# Patient Record
Sex: Male | Born: 1955
Health system: Southern US, Community
[De-identification: ages and names within clinical notes are randomized; demographics above are authoritative.]

## PROBLEM LIST (undated history)

## (undated) DIAGNOSIS — J449 Chronic obstructive pulmonary disease, unspecified: Secondary | ICD-10-CM

## (undated) DIAGNOSIS — M1990 Unspecified osteoarthritis, unspecified site: Secondary | ICD-10-CM

## (undated) DIAGNOSIS — IMO0002 Reserved for concepts with insufficient information to code with codable children: Secondary | ICD-10-CM

## (undated) DIAGNOSIS — E039 Hypothyroidism, unspecified: Secondary | ICD-10-CM

## (undated) DIAGNOSIS — Z950 Presence of cardiac pacemaker: Secondary | ICD-10-CM

## (undated) DIAGNOSIS — J189 Pneumonia, unspecified organism: Secondary | ICD-10-CM

## (undated) DIAGNOSIS — R0600 Dyspnea, unspecified: Secondary | ICD-10-CM

## (undated) DIAGNOSIS — J439 Emphysema, unspecified: Secondary | ICD-10-CM

## (undated) DIAGNOSIS — T7840XA Allergy, unspecified, initial encounter: Secondary | ICD-10-CM

## (undated) DIAGNOSIS — F419 Anxiety disorder, unspecified: Secondary | ICD-10-CM

## (undated) DIAGNOSIS — D649 Anemia, unspecified: Secondary | ICD-10-CM

## (undated) DIAGNOSIS — K219 Gastro-esophageal reflux disease without esophagitis: Secondary | ICD-10-CM

## (undated) DIAGNOSIS — E079 Disorder of thyroid, unspecified: Secondary | ICD-10-CM

## (undated) DIAGNOSIS — C801 Malignant (primary) neoplasm, unspecified: Secondary | ICD-10-CM

## (undated) DIAGNOSIS — B1920 Unspecified viral hepatitis C without hepatic coma: Secondary | ICD-10-CM

## (undated) DIAGNOSIS — Z923 Personal history of irradiation: Secondary | ICD-10-CM

## (undated) DIAGNOSIS — G4730 Sleep apnea, unspecified: Secondary | ICD-10-CM

## (undated) DIAGNOSIS — I10 Essential (primary) hypertension: Secondary | ICD-10-CM

## (undated) HISTORY — DX: Emphysema, unspecified: J43.9

## (undated) HISTORY — PX: ELBOW SURGERY: SHX618

## (undated) HISTORY — PX: BACK SURGERY: SHX140

## (undated) HISTORY — DX: Malignant (primary) neoplasm, unspecified: C80.1

## (undated) HISTORY — DX: Disorder of thyroid, unspecified: E07.9

## (undated) HISTORY — PX: OTHER SURGICAL HISTORY: SHX169

## (undated) HISTORY — DX: Unspecified osteoarthritis, unspecified site: M19.90

## (undated) HISTORY — PX: TONSILLECTOMY: SUR1361

## (undated) HISTORY — PX: SHOULDER SURGERY: SHX246

## (undated) HISTORY — DX: Anemia, unspecified: D64.9

## (undated) HISTORY — DX: Sleep apnea, unspecified: G47.30

## (undated) HISTORY — PX: WRIST SURGERY: SHX841

## (undated) HISTORY — DX: Gastro-esophageal reflux disease without esophagitis: K21.9

## (undated) HISTORY — DX: Allergy, unspecified, initial encounter: T78.40XA

## (undated) HISTORY — DX: Anxiety disorder, unspecified: F41.9

## (undated) HISTORY — DX: Reserved for concepts with insufficient information to code with codable children: IMO0002

## (undated) HISTORY — DX: Essential (primary) hypertension: I10

---

## 1999-10-02 ENCOUNTER — Ambulatory Visit (HOSPITAL_COMMUNITY): Admission: RE | Admit: 1999-10-02 | Discharge: 1999-10-02 | Payer: Self-pay | Admitting: Gastroenterology

## 1999-10-02 ENCOUNTER — Encounter: Payer: Self-pay | Admitting: Gastroenterology

## 1999-10-02 ENCOUNTER — Encounter (INDEPENDENT_AMBULATORY_CARE_PROVIDER_SITE_OTHER): Payer: Self-pay | Admitting: Specialist

## 2002-06-09 ENCOUNTER — Encounter: Payer: Self-pay | Admitting: Specialist

## 2002-06-09 ENCOUNTER — Ambulatory Visit (HOSPITAL_COMMUNITY): Admission: RE | Admit: 2002-06-09 | Discharge: 2002-06-09 | Payer: Self-pay | Admitting: Specialist

## 2002-08-11 ENCOUNTER — Encounter: Payer: Self-pay | Admitting: Orthopaedic Surgery

## 2002-08-13 ENCOUNTER — Inpatient Hospital Stay (HOSPITAL_COMMUNITY): Admission: RE | Admit: 2002-08-13 | Discharge: 2002-08-15 | Payer: Self-pay | Admitting: Orthopaedic Surgery

## 2002-08-13 ENCOUNTER — Encounter: Payer: Self-pay | Admitting: Orthopaedic Surgery

## 2002-08-15 ENCOUNTER — Encounter: Payer: Self-pay | Admitting: Orthopaedic Surgery

## 2005-05-01 ENCOUNTER — Emergency Department (HOSPITAL_COMMUNITY): Admission: EM | Admit: 2005-05-01 | Discharge: 2005-05-01 | Payer: Self-pay | Admitting: Emergency Medicine

## 2010-02-07 ENCOUNTER — Encounter: Payer: Self-pay | Admitting: Family Medicine

## 2010-02-07 ENCOUNTER — Ambulatory Visit: Payer: Self-pay | Admitting: Family Medicine

## 2010-02-07 DIAGNOSIS — I10 Essential (primary) hypertension: Secondary | ICD-10-CM | POA: Insufficient documentation

## 2010-02-07 DIAGNOSIS — J449 Chronic obstructive pulmonary disease, unspecified: Secondary | ICD-10-CM | POA: Insufficient documentation

## 2010-02-07 DIAGNOSIS — J4489 Other specified chronic obstructive pulmonary disease: Secondary | ICD-10-CM | POA: Insufficient documentation

## 2010-02-07 DIAGNOSIS — K219 Gastro-esophageal reflux disease without esophagitis: Secondary | ICD-10-CM

## 2010-02-07 DIAGNOSIS — J309 Allergic rhinitis, unspecified: Secondary | ICD-10-CM | POA: Insufficient documentation

## 2010-02-07 DIAGNOSIS — F17200 Nicotine dependence, unspecified, uncomplicated: Secondary | ICD-10-CM | POA: Insufficient documentation

## 2010-02-07 LAB — CONVERTED CEMR LAB
ALT: 56 units/L — ABNORMAL HIGH (ref 0–53)
AST: 51 units/L — ABNORMAL HIGH (ref 0–37)
Albumin: 4.1 g/dL (ref 3.5–5.2)
Alkaline Phosphatase: 58 units/L (ref 39–117)
BUN: 11 mg/dL (ref 6–23)
Basophils Absolute: 0.1 10*3/uL (ref 0.0–0.1)
Basophils Relative: 0.8 % (ref 0.0–3.0)
Bilirubin Urine: NEGATIVE
Bilirubin, Direct: 0.1 mg/dL (ref 0.0–0.3)
Blood in Urine, dipstick: NEGATIVE
CO2: 27 meq/L (ref 19–32)
Calcium: 9 mg/dL (ref 8.4–10.5)
Chloride: 102 meq/L (ref 96–112)
Cholesterol: 151 mg/dL (ref 0–200)
Creatinine, Ser: 0.9 mg/dL (ref 0.4–1.5)
Eosinophils Absolute: 0.2 10*3/uL (ref 0.0–0.7)
Eosinophils Relative: 2.1 % (ref 0.0–5.0)
GFR calc non Af Amer: 90.26 mL/min (ref >60)
Glucose, Bld: 84 mg/dL (ref 70–99)
Glucose, Urine, Semiquant: NEGATIVE
HCT: 45.5 % (ref 39.0–52.0)
HDL: 38.8 mg/dL — ABNORMAL LOW (ref >39.00)
Hemoglobin: 15.6 g/dL (ref 13.0–17.0)
Ketones, urine, test strip: NEGATIVE
LDL Cholesterol: 93 mg/dL (ref 0–99)
Lymphocytes Relative: 30.1 % (ref 12.0–46.0)
Lymphs Abs: 2.3 10*3/uL (ref 0.7–4.0)
MCHC: 34.3 g/dL (ref 30.0–36.0)
MCV: 96.9 fL (ref 78.0–100.0)
Monocytes Absolute: 0.7 10*3/uL (ref 0.1–1.0)
Monocytes Relative: 8.7 % (ref 3.0–12.0)
Neutro Abs: 4.5 10*3/uL (ref 1.4–7.7)
Neutrophils Relative %: 58.3 % (ref 43.0–77.0)
Nitrite: NEGATIVE
PSA: 0.69 ng/mL (ref 0.10–4.00)
Platelets: 231 10*3/uL (ref 150.0–400.0)
Potassium: 4 meq/L (ref 3.5–5.1)
RBC: 4.7 M/uL (ref 4.22–5.81)
RDW: 12.7 % (ref 11.5–14.6)
Sodium: 139 meq/L (ref 135–145)
Specific Gravity, Urine: 1.015
TSH: 1.23 microintl units/mL (ref 0.35–5.50)
Total Bilirubin: 0.8 mg/dL (ref 0.3–1.2)
Total CHOL/HDL Ratio: 4
Total Protein: 6.8 g/dL (ref 6.0–8.3)
Triglycerides: 97 mg/dL (ref 0.0–149.0)
Urobilinogen, UA: 0.2
VLDL: 19.4 mg/dL (ref 0.0–40.0)
WBC Urine, dipstick: NEGATIVE
WBC: 7.7 10*3/uL (ref 4.5–10.5)
pH: 6

## 2010-02-08 ENCOUNTER — Ambulatory Visit: Payer: Self-pay | Admitting: Family Medicine

## 2010-02-20 ENCOUNTER — Ambulatory Visit: Payer: Self-pay | Admitting: Family Medicine

## 2010-03-09 ENCOUNTER — Ambulatory Visit: Payer: Self-pay | Admitting: Family Medicine

## 2010-03-28 ENCOUNTER — Telehealth: Payer: Self-pay | Admitting: Family Medicine

## 2010-04-06 ENCOUNTER — Ambulatory Visit
Admission: RE | Admit: 2010-04-06 | Discharge: 2010-04-06 | Payer: Self-pay | Source: Home / Self Care | Attending: Family Medicine | Admitting: Family Medicine

## 2010-04-06 ENCOUNTER — Other Ambulatory Visit: Payer: Self-pay | Admitting: Family Medicine

## 2010-04-07 LAB — BASIC METABOLIC PANEL
BUN: 15 mg/dL (ref 6–23)
CO2: 29 mEq/L (ref 19–32)
Calcium: 9.2 mg/dL (ref 8.4–10.5)
Chloride: 102 mEq/L (ref 96–112)
Creatinine, Ser: 0.8 mg/dL (ref 0.4–1.5)
GFR: 100.19 mL/min (ref >60.00)
Glucose, Bld: 79 mg/dL (ref 70–99)
Potassium: 4.4 mEq/L (ref 3.5–5.1)
Sodium: 140 mEq/L (ref 135–145)

## 2010-04-25 NOTE — Assessment & Plan Note (Signed)
Summary: 1 WEEK FUP//CCM   Vital Signs:  Patient profile:   55 year old male Weight:      198 pounds Temp:     98.3 degrees F oral BP sitting:   150 / 98  (left arm) Cuff size:   regular  Vitals Entered By: Kern Reap CMA Duncan Dull) (February 20, 2010 4:55 PM) CC: follow-up visit   CC:  follow-up visit.  History of Present Illness: George Nielsen is a 55 year old male, smoker, who comes in today for reevaluation of asthma and tobacco abuse.  On November the 15th we saw him and start him on prednisone 40 mg x 3 days with the taper.  He checked her for 9 days and stopped.  He is also started the chantix and is taken one tablet daily, however, he still continues to smoke 10 cigarettes a day.  He's taken hydrochlorothiazide, and Norvasc for his hypertension.  BP 150/90.  He insisted and a shot of antibiotics and a shot of steroids or what he needs it.  I explained to him.  He is already on steroids.  He does not need a shot and if he would like an antibiotic would be happy to write him some although the major issue is............. bite the bullet and stop smoking  Allergies: 1)  ! * Bee Stings 2)  ! * Poison Ivy  Past History:  Past medical, surgical, family and social histories (including risk factors) reviewed for relevance to current acute and chronic problems.  Past Medical History: Reviewed history from 02/07/2010 and no changes required. Allergic rhinitis GERD Hypertension hx of ulcers  Past Surgical History: Reviewed history from 02/07/2010 and no changes required. Tonsillectomy back surgery right arm surgery  Family History: Reviewed history from 02/07/2010 and no changes required. Father: deceased - heart diseased Mother: deceased - breast cancer Siblings:   Social History: Reviewed history from 02/07/2010 and no changes required. Occupation: Divorced Current Smoker Alcohol use-yes Drug use-no Regular exercise-no  Review of Systems      See HPI  Physical  Exam  General:  Well-developed,well-nourished,in no acute distress; alert,appropriate and cooperative throughout examination Head:  Normocephalic and atraumatic without obvious abnormalities. No apparent alopecia or balding. Eyes:  No corneal or conjunctival inflammation noted. EOMI. Perrla. Funduscopic exam benign, without hemorrhages, exudates or papilledema. Vision grossly normal. Ears:  External ear exam shows no significant lesions or deformities.  Otoscopic examination reveals clear canals, tympanic membranes are intact bilaterally without bulging, retraction, inflammation or discharge. Hearing is grossly normal bilaterally. Nose:  External nasal examination shows no deformity or inflammation. Nasal mucosa are pink and moist without lesions or exudates. Mouth:  Oral mucosa and oropharynx without lesions or exudates.  Teeth in good repair. Neck:  No deformities, masses, or tenderness noted. Lungs:  marked decrease in breath sounds inspiratory and expiratory wheezing   Impression & Recommendations:  Problem # 1:  TOBACCO USE (ICD-305.1) Assessment Improved  His updated medication list for this problem includes:    Chantix Continuing Month Pak 1 Mg Tabs (Varenicline tartrate) .Marland Kitchen... 1/2 tab qam  Orders: Tobacco use cessation intermediate 3-10 minutes (24401)  Problem # 2:  ASTHMA WITH COPD (ICD-493.20) Assessment: Improved  His updated medication list for this problem includes:    Prednisone 20 Mg Tabs (Prednisone) ..... Uad  Orders: Tobacco use cessation intermediate 3-10 minutes (99406)  Complete Medication List: 1)  Norvasc 5 Mg Tabs (Amlodipine besylate) .... Take 1 tablet by mouth every morning 2)  Hydrochlorothiazide 25 Mg Tabs (Hydrochlorothiazide) .Marland KitchenMarland KitchenMarland Kitchen  Take 1 tablet by mouth every morning 3)  Prednisone 20 Mg Tabs (Prednisone) .... Uad 4)  Chantix Continuing Month Pak 1 Mg Tabs (Varenicline tartrate) .... 1/2 tab qam 5)  Doxycycline Hyclate 100 Mg Caps (Doxycycline  hyclate) .... Take 1 tablet by mouth two times a day  Patient Instructions: 1)  restart the prednisone by taking one tablet daily for 5 days, a half a tablet for 5 days, then half a tablet every other day for a 3-week taper. 2)   30 ounces of water a day. 3)  Stop smoking completely. 4)  Doxycycline.......... is an antibiotic............ one tablet twice daily for two weeks. 5)  Check a blood pressure daily in the morning and continue your blood pressure medication. 6)  Return in two weeks for follow-up Prescriptions: PREDNISONE 20 MG TABS (PREDNISONE) UAD  #30 x 1   Entered and Authorized by:   Roderick Pee MD   Signed by:   Roderick Pee MD on 02/20/2010   Method used:   Print then Give to Patient   RxID:   [id number] DOXYCYCLINE HYCLATE 100 MG CAPS (DOXYCYCLINE HYCLATE) Take 1 tablet by mouth two times a day  #30 x 0   Entered and Authorized by:   Roderick Pee MD   Signed by:   Roderick Pee MD on 02/20/2010   Method used:   Print then Give to Patient   RxID:   [id number]    Orders Added: 1)  Est. Patient Level IV [84696] 2)  Tobacco use cessation intermediate 3-10 minutes [99406]

## 2010-04-25 NOTE — Assessment & Plan Note (Signed)
Summary: NEW PT EST // RS PT [facility]/NJR   Vital Signs:  Patient profile:   55 year old male Height:      72 inches Weight:      195 pounds BMI:     26.54 Temp:     97.6 degrees F oral BP sitting:   170 / 98  (left arm) Cuff size:   regular  Vitals Entered By: Kern Reap CMA Duncan Dull) (February 07, 2010 3:44 PM) CC: new to establish  Is Patient Diabetic? No Pain Assessment Patient in pain? no        CC:  new to establish .  History of Present Illness: George Nielsen is a 55 year old, divorced male, smoker, one pack a day for 30+ years, who comes in today as a new patient for evaluation of tobacco abuse, shortness of breath, and hypertension.  He's been previously been going to the [place].  He was in the [employer] for two years.  He went to the [place] because he lost his health in.  Tetanus was two 2009 seasonal flu shot 2011  He has a long-standing history of tobacco abuse as noted above.  He quit for a year with the program called chantix last chest x-ray unknown.  He's had a history of hypertension.  He was on lisinopril 20 -- 12.5, but he says it causing joint pain, so he stopped it.  BP today 170 over hundred,  He's also complaining of wheezing for the last two months.  Last physical exam for 5 years ago  Preventive Screening-Counseling & Management  Alcohol-Tobacco     Smoking Status: current     Packs/Day: 1.0  Caffeine-Diet-Exercise     Does Patient Exercise: no      Drug Use:  no.    Allergies (verified): 1)  ! * Bee Stings 2)  ! * Poison Ivy  Past History:  Past Medical History: Allergic rhinitis GERD Hypertension hx of ulcers  Past Surgical History: Tonsillectomy back surgery right arm surgery  Family History: Father: deceased - heart diseased Mother: deceased - breast cancer Siblings:   Social History: Reviewed history and no changes required. Occupation: Divorced Current Smoker Alcohol use-yes Drug use-no Regular exercise-no Smoking Status:   current Packs/Day:  1.0 Drug Use:  no Does Patient Exercise:  no  Physical Exam  General:  Well-developed,well-nourished,in no acute distress; alert,appropriate and cooperative throughout examination Neck:  No deformities, masses, or tenderness noted. Chest Wall:  No deformities, masses, tenderness or gynecomastia noted. Lungs:  barely audible breath sounds bilateral wheezing Heart:  the PMI is nonpalpable.  Heart sounds are distant because of underlying COPD.  I can appreciate no murmurs   Problems:  Medical Problems Added: 1)  Dx of Tobacco Use  (ICD-305.1) 2)  Dx of Asthma With COPD  (ICD-493.20) 3)  Dx of Hypertension, Malignant Essential  (ICD-401.0) 4)  Dx of Hypertension  (ICD-401.9) 5)  Dx of Gerd  (ICD-530.81) 6)  Dx of Allergic Rhinitis  (ICD-477.9)  Impression & Recommendations:  Problem # 1:  TOBACCO USE (ICD-305.1) Assessment New  Orders: T-2 View CXR (71020TC) Venipuncture (81191) TLB-Lipid Panel (80061-LIPID) TLB-BMP (Basic Metabolic Panel-BMET) (80048-METABOL) TLB-CBC Platelet - w/Differential (85025-CBCD) TLB-Hepatic/Liver Function Pnl (80076-HEPATIC) TLB-TSH (Thyroid Stimulating Hormone) (84443-TSH) TLB-PSA (Prostate Specific Antigen) (84153-PSA) UA Dipstick w/o Micro (automated)  (81003) Tobacco use cessation intensive >10 minutes (47829) Specimen Handling (56213) EKG w/ Interpretation (93000) Spirometry w/Graph (94010)  His updated medication list for this problem includes:    Chantix Continuing Month Pak 1  Mg Tabs (Varenicline tartrate) .Marland Kitchen... 1/2 tab qam  Problem # 2:  ASTHMA WITH COPD (ICD-493.20) Assessment: New  Orders: T-2 View CXR (71020TC) Venipuncture (72536) TLB-Lipid Panel (80061-LIPID) TLB-BMP (Basic Metabolic Panel-BMET) (80048-METABOL) TLB-CBC Platelet - w/Differential (85025-CBCD) TLB-Hepatic/Liver Function Pnl (80076-HEPATIC) TLB-TSH (Thyroid Stimulating Hormone) (84443-TSH) TLB-PSA (Prostate Specific Antigen)  (84153-PSA) UA Dipstick w/o Micro (automated)  (81003) Specimen Handling (64403) Spirometry w/Graph (94010)  His updated medication list for this problem includes:    Prednisone 20 Mg Tabs (Prednisone) ..... Uad  Problem # 3:  HYPERTENSION (ICD-401.9) Assessment: New  The following medications were removed from the medication list:    Lisinopril-hydrochlorothiazide 20-12.5 Mg Tabs (Lisinopril-hydrochlorothiazide) .Marland Kitchen... Take one tab by mouth once daily His updated medication list for this problem includes:    Norvasc 5 Mg Tabs (Amlodipine besylate) .Marland Kitchen... Take 1 tablet by mouth every morning    Hydrochlorothiazide 25 Mg Tabs (Hydrochlorothiazide) .Marland Kitchen... Take 1 tablet by mouth every morning  Complete Medication List: 1)  Norvasc 5 Mg Tabs (Amlodipine besylate) .... Take 1 tablet by mouth every morning 2)  Hydrochlorothiazide 25 Mg Tabs (Hydrochlorothiazide) .... Take 1 tablet by mouth every morning 3)  Prednisone 20 Mg Tabs (Prednisone) .... Uad 4)  Chantix Continuing Month Pak 1 Mg Tabs (Varenicline tartrate) .... 1/2 tab qam  Patient Instructions: 1)  u  must stop smoking completely now!!!!!!!!!!!!!! 2)  Begin the chantix one half tab q.a.m. 3)  Take one of the Norvasc and hydrochlorothiazide when you get it filled, now, then, starting tomorrow morning, take one of each every morning to lower your blood pressure goal is 135/85 or less, your current blood pressure is 170 over hundred. 4)  Measure your blood pressure daily in the morning and return in one week with the data and the device. 5)  Go  to the main office tomorrow for a chest x-ray. 6)  I will call you about your laboratory if there is anything unusual.  If not, will go to all your laboratory when you come back in a week  7)  Prednisone two tabs x 3 days, one x 3 days, as x 3 days, then after tablet Monday, Wednesday, Friday, for a two week taper.  This is to stop the wheezing.  He must stay on a salt free  diet Prescriptions: CHANTIX CONTINUING MONTH PAK 1 MG TABS (VARENICLINE TARTRATE) 1/2 tab qam  #1 x 2   Entered and Authorized by:   Roderick Pee MD   Signed by:   Roderick Pee MD on 02/07/2010   Method used:   Print then Give to Patient   RxID:   [id number] PREDNISONE 20 MG TABS (PREDNISONE) UAD  #30 x 0   Entered and Authorized by:   Roderick Pee MD   Signed by:   Roderick Pee MD on 02/07/2010   Method used:   Print then Give to Patient   RxID:   [id number] HYDROCHLOROTHIAZIDE 25 MG TABS (HYDROCHLOROTHIAZIDE) Take 1 tablet by mouth every morning  #30 x 1   Entered and Authorized by:   Roderick Pee MD   Signed by:   Roderick Pee MD on 02/07/2010   Method used:   Print then Give to Patient   RxID:   [id number] NORVASC 5 MG TABS (AMLODIPINE BESYLATE) Take 1 tablet by mouth every morning  #30 x 1   Entered and Authorized by:   Roderick Pee MD   Signed by:   Roderick Pee MD  on 02/07/2010   Method used:   Print then Give to Patient   RxID:   [id number]    Orders Added: 1)  T-2 View CXR [71020TC] 2)  Venipuncture [36415] 3)  TLB-Lipid Panel [80061-LIPID] 4)  TLB-BMP (Basic Metabolic Panel-BMET) [80048-METABOL] 5)  TLB-CBC Platelet - w/Differential [85025-CBCD] 6)  TLB-Hepatic/Liver Function Pnl [80076-HEPATIC] 7)  TLB-TSH (Thyroid Stimulating Hormone) [84443-TSH] 8)  TLB-PSA (Prostate Specific Antigen) [56213-YQM] 9)  New Patient Level IV [99204] 10)  UA Dipstick w/o Micro (automated)  [81003] 11)  Tobacco use cessation intensive >10 minutes [99407] 12)  Specimen Handling [99000] 13)  EKG w/ Interpretation [93000] 14)  Spirometry w/Graph [94010]    Laboratory Results   Urine Tests    Routine Urinalysis   Color: yellow Appearance: Clear Glucose: negative   (Normal Range: Negative) Bilirubin: negative   (Normal Range: Negative) Ketone: negative   (Normal Range: Negative) Spec. Gravity: 1.015   (Normal Range: 1.003-1.035) Blood:  negative   (Normal Range: Negative) pH: 6.0   (Normal Range: 5.0-8.0) Protein: 2+   (Normal Range: Negative) Urobilinogen: 0.2   (Normal Range: 0-1) Nitrite: negative   (Normal Range: Negative) Leukocyte Esterace: negative   (Normal Range: Negative)    Comments: Rita Ohara  February 07, 2010 5:06 PM

## 2010-04-27 NOTE — Progress Notes (Signed)
Summary: Pt req refill Norvasc and HCTZ  Phone Note Refill Request Call back at Home Phone [phone] Message from:  Patient on March 28, 2010 4:50 PM  Refills Requested: Medication #1:  NORVASC 5 MG TABS Take 1 tablet by mouth every morning   Dosage confirmed as above?Dosage Confirmed   Supply Requested: 1 month  Medication #2:  HYDROCHLOROTHIAZIDE 25 MG TABS Take 1 tablet by mouth every morning   Dosage confirmed as above?Dosage Confirmed   Supply Requested: 1 month  Method Requested: Telephone to [facility] Initial call taken by: Lucy Antigua,  March 28, 2010 4:51 PM    Prescriptions: HYDROCHLOROTHIAZIDE 25 MG TABS (HYDROCHLOROTHIAZIDE) Take 1 tablet by mouth every morning  #90 x 3   Entered by:   Kern Reap CMA (AAMA)   Authorized by:   Roderick Pee MD   Signed by:   Kern Reap CMA (AAMA) on 03/28/2010   Method used:   Electronically to        [facility] [phone]* (retail)       [address]       [place], [place]  [address]       Ph: [phone] or [phone]       Fax: [phone]   RxID:   [id number] NORVASC 5 MG TABS (AMLODIPINE BESYLATE) Take 1 tablet by mouth every morning  #90 x 3   Entered by:   Kern Reap CMA (AAMA)   Authorized by:   Roderick Pee MD   Signed by:   Kern Reap CMA (AAMA) on 03/28/2010   Method used:   [facility] to        [facility] [phone][facility] (retail)       [address]       [place], [place]  [address]       Ph: [phone] or [phone]       Fax: [phone]   RxID:   [id number]

## 2010-04-27 NOTE — Assessment & Plan Note (Signed)
Summary: fup on wheezing/cjr   Vital Signs:  Patient profile:   55 year old male Weight:      193 pounds O2 Sat:      96 % on Room air Temp:     98.3 degrees F oral Pulse rate:   96 / minute BP sitting:   150 / 80  (left arm) Cuff size:   regular  Vitals Entered By: Romualdo Bolk, CMA (AAMA) (April 06, 2010 4:14 PM)  O2 Flow:  Room air CC: Follow-up visit on wheezing   CC:  Follow-up visit on wheezing.  History of Present Illness: George Nielsen is a 55 year old male, smoker, who comes back today for reevaluation of smoking cessation, and hypertension.  Four hypertension.  He is taking Norvasc 5 mg daily and hydrochlorothiazide 25 mg daily.  BP 150/80.  He forgot to check his blood pressure at home.  He is taking over-the-counter potassium because of leg cramps.  We will get a be met today.  To determine his potassium level.  We gave him chantix however, when he quit smoking.  He stopped chantix.  Is now back to smoking a pack of cigarettes a day.  I explained that he needs to take the chantix until he quit smoking and then 6 to 8 months after that continue the chantix  Preventive Screening-Counseling & Management  Alcohol-Tobacco     Smoking Status: current     Packs/Day: 1.0  Caffeine-Diet-Exercise     Does Patient Exercise: no  Current Medications (verified): 1)  Norvasc 5 Mg Tabs (Amlodipine Besylate) .... Take 1 Tablet By Mouth Every Morning 2)  Hydrochlorothiazide 25 Mg Tabs (Hydrochlorothiazide) .... Take 1 Tablet By Mouth Every Morning  Allergies (verified): 1)  ! * Bee Stings 2)  ! * Poison Ivy  Past History:  Past medical, surgical, family and social histories (including risk factors) reviewed for relevance to current acute and chronic problems.  Past Medical History: Reviewed history from 02/07/2010 and no changes required. Allergic rhinitis GERD Hypertension hx of ulcers  Past Surgical History: Reviewed history from 02/07/2010 and no changes  required. Tonsillectomy back surgery right arm surgery  Family History: Reviewed history from 02/07/2010 and no changes required. Father: deceased - heart diseased Mother: deceased - breast cancer Siblings:   Social History: Reviewed history from 02/07/2010 and no changes required. Occupation: Divorced Current Smoker Alcohol use-yes Drug use-no Regular exercise-no  Review of Systems      See HPI  Physical Exam  General:  Well-developed,well-nourished,in no acute distress; alert,appropriate and cooperative throughout examination Psych:  Cognition and judgment appear intact. Alert and cooperative with normal attention span and concentration. No apparent delusions, illusions, hallucinations   Impression & Recommendations:  Problem # 1:  TOBACCO USE (ICD-305.1) Assessment Unchanged  The following medications were removed from the medication list:    Chantix Continuing Month Pak 1 Mg Tabs (Varenicline tartrate) .Marland Kitchen... 1/2 tab qam  Orders: Venipuncture (16109) TLB-BMP (Basic Metabolic Panel-BMET) (80048-METABOL) Tobacco use cessation intermediate 3-10 minutes (60454)  Problem # 2:  HYPERTENSION, MALIGNANT ESSENTIAL (ICD-401.0) Assessment: Improved  His updated medication list for this problem includes:    Norvasc 5 Mg Tabs (Amlodipine besylate) .Marland Kitchen... Take 1 tablet by mouth every morning    Hydrochlorothiazide 25 Mg Tabs (Hydrochlorothiazide) .Marland Kitchen... Take 1 tablet by mouth every morning  Orders: Venipuncture (09811) TLB-BMP (Basic Metabolic Panel-BMET) (80048-METABOL) Tobacco use cessation intermediate 3-10 minutes (91478)  Complete Medication List: 1)  Norvasc 5 Mg Tabs (Amlodipine besylate) .... Take 1  tablet by mouth every morning 2)  Hydrochlorothiazide 25 Mg Tabs (Hydrochlorothiazide) .... Take 1 tablet by mouth every morning  Patient Instructions: 1)  I will call you I get the report on your lab work 2)  Restart the chantix by taking half a tablet a day for two  weeks then a half a tablet twice daily.  Also begin to taper the cigarettes by two per week.........Marland Kitchen 10........8........ 6.etc. 3)  Return in two months for follow-up. 4)  Do not stop taking the chantix if prior to returning u  stop smoking   Orders Added: 1)  Venipuncture [36415] 2)  TLB-BMP (Basic Metabolic Panel-BMET) [80048-METABOL] 3)  Est. Patient Level IV [04540] 4)  Tobacco use cessation intermediate 3-10 minutes [99406]

## 2010-05-02 ENCOUNTER — Telehealth: Payer: Self-pay | Admitting: *Deleted

## 2010-05-02 NOTE — Telephone Encounter (Signed)
Spoke with patient.

## 2010-05-02 NOTE — Telephone Encounter (Signed)
Okay to take one potassium tablet if he feels like it might help

## 2010-05-02 NOTE — Telephone Encounter (Signed)
Patient is aware of his lab results.  He states that he has started taking his potassium again because he was cramping a lot.  He would like to know if this is okay or any other suggestions

## 2010-06-07 ENCOUNTER — Encounter: Payer: Self-pay | Admitting: Family Medicine

## 2010-06-08 ENCOUNTER — Ambulatory Visit: Payer: Self-pay | Admitting: Family Medicine

## 2010-07-26 ENCOUNTER — Other Ambulatory Visit: Payer: Self-pay | Admitting: *Deleted

## 2010-07-26 MED ORDER — AMLODIPINE BESYLATE 5 MG PO TABS: 5.0000 mg | ORAL_TABLET | Freq: Every day | ORAL | Status: DC

## 2010-07-26 MED ORDER — HYDROCHLOROTHIAZIDE 25 MG PO TABS: 25.0000 mg | ORAL_TABLET | Freq: Every day | ORAL | Status: DC

## 2010-08-11 NOTE — Op Note (Signed)
NAME:  BRIGIDO, MERA                     ACCOUNT NO.:  [id number]   MEDICAL RECORD NO.:  [id number]                   PATIENT TYPE:  INP   LOCATION:  5016                                 FACILITY:  [facility]   PHYSICIAN:  Sharolyn Douglas, M.D.                     DATE OF BIRTH:  [birth year]   DATE OF PROCEDURE:  08/13/2002  DATE OF DISCHARGE:                                 OPERATIVE REPORT   POSTOPERATIVE DIAGNOSES:  Grade 2-3 isthmic L5-S1 spondylolisthesis with  bilateral lower extremity radiculopathy, right greater than left.   PROCEDURE:  1. L4-5 and L5-S1 laminectomy with facetectomies and wide decompression of     the L5 and S1 nerve roots bilaterally.  2. Posterior spinal arthrodesis L4 to the sacrum with reduction of     deformity.  3. Segmental pedicle screw instrumentation L4 to the sacrum utilizing the     spinal concepts and compass system.  4. Left posterior iliac crest bone graft.  5. Local autogenous bone graft supplemented with platelet gel.  6. Neurologic monitoring utilizing free-running and triggered     electromyograms with testing of six pedicle screws.   SURGEON:  Sharolyn Douglas, M.D.   ASSISTANT:  Verlin Fester, P.A.   ANESTHESIA:  General endotracheal.   ESTIMATED BLOOD LOSS:  450 cc.   COMPLICATIONS:  None.   INDICATIONS:  The patient is a 55 year old male with a long history of back  and bilateral lower extremity pain, right greater than left.  Physical  examination and history is consistent with an L5 and S1 radiculopathy, right  greater than left.  He had several days of relief with bilateral L5 nerve  root injections.  His plane radiographs show a grade 2-3 isthmic  spondylolisthesis at L5-S1.  MRI scan again demonstrates the  spondylolisthesis with degenerative end-plate changes, disk space narrowing,  and severe foraminal narrowing at L5-S1.  Because of his persistent symptoms  refractory to conservative care, he has elected to undergo decompression  and  fusion L4 to the sacrum in hopes of stabilizing his listhesis, decompressing  his nerve roots, and improving his symptomatology.  The patient understands  the risks and benefits.   PROCEDURE:  The patient was properly identified in the holding area, taken  to the operating room.  He underwent general endotracheal anesthesia without  difficulty.  He was given prophylactic IV antibiotics.  EMGs were placed for  monitoring free-running and triggered EMGs with the __________ Neurovision  monitoring system.  The patient was carefully turned prone onto the Acromed  four-poster positioning frame.  All bony prominences were padded.  His face  and eyes were protected at all times.  The back was prepped and draped in  the usual sterile fashion.  A 10-cm incision was then made centered over the  L5-S1 interspace.  This was easily identifiable by the palpable stepoff.  Dissection was carried down to the deep  fascia.  The deep fascia was incised  and the paraspinal muscles subperiosteally elevated out to the tips of the  transverse processes of L4, L5, and the sacral ala.  The L5 spinous process  was loose secondary to pars fracture.  The L5 transverse processes were  subluxated anterior to the sacral ala.  We then placed our deep retractor.  We performed a total laminectomy of the Gill fragment.  We identified the L4-  5 facet joints which were degenerative and impinging upon the L5-S1 joint  and ala.  We performed wide foraminotomies at L5-S1 bilaterally.  We found  the L5 nerve root to be severely compressed within the foramen secondary to  the L5 pedicle compressing upon the sacral ala as well as fibrous material  from the pars defects.  The right L5 nerve root was more severely compressed  than the left.  The S1 nerve roots were also decompressed out their  respective foramen.  The S1 nerve roots were very tight secondary to tension  because of the anterior slippage of L5 on S1.  We evaluated  the patient's L5-  S1 disk.  Because of the slip and the severe tension that was on the S1  nerve roots, we felt that it would not be possible to perform a  transforaminal lumbar interbody fusion.  Therefore, we turned our attention  to performing a fusion from L4 to the sacrum posterolaterally.  We used a  high-speed bur to decorticate the L4, L5 and sacral ala bilaterally.  The  pars interarticularis individual facet joints were also decorticated.  We  then turned our attention to collecting left posterior iliac crest bone  graft.  A separate fascial incision was made on the left side over the  posterior superior iliac spine.  Dissection was carried down through the  deep fascia.  A Leksell was used to remove the cap of the posterior superior  iliac spine.  We then used curets to remove copious amounts of iliac crest  bone graft from between the iliac tables.  The local bone that had been  collected from the laminectomy defect was morselized and mixed with platelet  gel making bone graft logs.  We then tightly packed the bone graft material  over the L4-L5 transverse processes and sacral ala bilaterally.  We then  placed pedicle screws at L4, L5, and S1 using an anatomic probing technique.  Each pedicle starting point was identified anatomically, confirmed with  fluoroscopy.  The pedicle holes were started with the awl.  The pedicle was  cannulated with the pedicle probe.  Each pedicle hole was then palpated  using the ball-tip probe.  The pedicle hole was tapped and once again  palpated.  We were also able to palpate the L5 and S1 pedicles from within  the canal.  There were no breeches.  We then placed 6.5-mm screws at L4, 7.5-  mm screws at L5 and S1.  We attached our rods and locking caps.  We then  used the __________ reducer to pull the L5 pedicle screw back to the rod.  We did this under live fluoroscopy.  We were able to reduce this spondylolisthesis several millimeters.  We then  evaluated the nerve root  again.  They were found to be completely freed out their respective foramen.  There was less tension on the S1 nerve roots.  We placed a cross-connector.  The final tightening was carried out for the locking caps.  Before placing  the rods, each pedicle screw was tested using triggered EMGs and in each  case we had a response greater than 20 milliamps consistent with  interosseous placement of the screws.  We monitored free-running EMGs  throughout the decompression.  There were no deleterious changes.  We placed  Gelfoam over the exposed epidural space.  A deep Hemovac drain was placed.  The fascia closed with a running #1 Vicryl, subcutaneous layer closed with 2-  0 Vicryl, followed by a running 3-0 nylon suture on the skin.  A sterile  dressing was applied.  The patient was turned supine, extubated without  difficulty, transferred to the recovery room, able to move his upper and  lower extremities in stable condition.                                               Sharolyn Douglas, M.D.    MC/MEDQ  D:  08/13/2002  T:  08/13/2002  Job:  [id number]

## 2010-08-11 NOTE — H&P (Signed)
NAME:  George Nielsen, George Nielsen                     ACCOUNT NO.:  [id number]   MEDICAL RECORD NO.:  [id number]                   PATIENT TYPE:  INP   LOCATION:  2550                                 FACILITY:  [facility]   PHYSICIAN:  Sharolyn Douglas, M.D.                     DATE OF BIRTH:  Nov 20, 1955   DATE OF ADMISSION:  08/13/2002  DATE OF DISCHARGE:                                HISTORY & PHYSICAL   CHIEF COMPLAINT:  Back and right lower extremity pain.   HISTORY OF PRESENT ILLNESS:  The patient is a 54 year old male with back and  right lower extremity pain for a number of years now.  He has been found to  have an L5-S1 spondylolisthesis which is grade 2 to grade 3.  Pain has  gotten severe.  He had a short-term relief from an L5 selective nerve root  block, complete resolution of his symptoms for a few days, however, they  quickly returned.  He has failed other types of conservative management  including activity modification, anti-inflammatory medication, narcotic  medications and pain medications.  Risks and benefits of the proposed  surgery were discussed with the patient by Dr. Sharolyn Douglas; he indicated  understanding and opted to proceed.   ALLERGIES:  No known drug allergies.   MEDICATIONS:  Tylenol p.r.n.   PAST MEDICAL HISTORY:  Past medical history is significant for hepatitis C.   PAST SURGICAL HISTORY:  Right wrist ORIF, left elbow ulnar nerve  transposition and left shoulder rotator cuff repair.   SOCIAL HISTORY:  The patient smokes one and a half to two packs of  cigarettes per day, drinks approximately four or more drinks or alcohol per  day.  He is single.  His girlfriend will be available to help him through  his postoperative course.   FAMILY MEDICAL HISTORY:  Father is deceased at age 47 secondary to MI and  coronary artery disease.  Mother deceased at age 23, also secondary to bone  cancer.   REVIEW OF SYSTEMS:  The patient denies any fevers, chills, sweats,  bleeding  tendencies.  CNS:  Denies blurred vision, double vision, seizures, headaches  or paralysis.  CARDIOVASCULAR:  Denies chest pain, angina, orthopnea,  claudication or palpitations.  PULMONARY:  Denies shortness of breath,  productive cough or hemoptysis.  GI:  Denies nausea, vomiting, constipation,  diarrhea, melena or bloody stools.  GU:  Denies dysuria or hematuria or  discharge.  MUSCULOSKELETAL:  As per HPI.   PHYSICAL EXAMINATION:  VITAL SIGNS:  Blood pressure 160/92, respirations 16  and unlabored, pulse is 86 and regular.  GENERAL APPEARANCE:  The patient is a 55 year old white male who is alert  and oriented, in no acute distress.  He is well-nourished and well-groomed,  appears his stated age.  He is pleasant and cooperative to exam.  HEENT:  Head is normocephalic, atraumatic.  Pupils are equal, round and  reactive.  Extraocular movements are intact.  Nares are patent.  Pharynx is  clear.  NECK:  Neck is soft to palpation.  No lymphadenopathy, thyromegaly or bruits  appreciated.  CHEST:  Chest has wheezes throughout bilateral lung fields; no rales,  rhonchi, stridor or friction rubs.  BREASTS:  Not pertinent and not performed.  HEART:  S1 and S2, regular rate and rhythm; no murmurs, gallops or rubs  noted.  ABDOMEN:  Abdomen soft to palpation, nontender and nondistended.  No  organomegaly noted.  Positive bowel sounds throughout.  GU:  GU is not pertinent and not performed.  EXTREMITIES:  The patient has right lower extremity pain.  Pulses are intact  and symmetric bilaterally.  NEUROLOGIC:  Motor function is grossly intact.  SKIN:  Skin is intact without any lesions or rashes.   IMAGING STUDIES:  X-ray and MRI show grade 2 to 3 spondylolisthesis at L5-  S1.   IMPRESSION:  1. L5-S1 spondylolisthesis.  2. Tobacco dependence.  3. Alcohol dependence.   PLAN:  Admit to [facility] on Aug 13, 2002 for L5-S1 Gill  laminectomy and decompression, as well as L4  to S1 posterior spinal fusion  with pedical screws; this will be done by Dr. Sharolyn Douglas.     Verlin Fester, P.A.                       Sharolyn Douglas, M.D.    CM/MEDQ  D:  08/13/2002  T:  08/13/2002  Job:  [id number]

## 2010-11-01 ENCOUNTER — Other Ambulatory Visit: Payer: Self-pay | Admitting: *Deleted

## 2010-11-01 MED ORDER — HYDROCHLOROTHIAZIDE 25 MG PO TABS: 25.0000 mg | ORAL_TABLET | Freq: Every day | ORAL | Status: DC

## 2010-11-01 MED ORDER — AMLODIPINE BESYLATE 5 MG PO TABS: 5.0000 mg | ORAL_TABLET | Freq: Every day | ORAL | Status: DC

## 2011-02-05 ENCOUNTER — Ambulatory Visit: Payer: Managed Care, Other (non HMO) | Admitting: Family Medicine

## 2012-07-10 ENCOUNTER — Encounter (HOSPITAL_COMMUNITY): Payer: Self-pay | Admitting: *Deleted

## 2012-07-10 ENCOUNTER — Emergency Department (HOSPITAL_COMMUNITY): Payer: Non-veteran care

## 2012-07-10 ENCOUNTER — Observation Stay (HOSPITAL_COMMUNITY)
Admission: EM | Admit: 2012-07-10 | Discharge: 2012-07-11 | Disposition: A | Discharge status: Home / Self Care | Payer: Non-veteran care | Attending: Internal Medicine | Admitting: Internal Medicine

## 2012-07-10 DIAGNOSIS — E871 Hypo-osmolality and hyponatremia: Secondary | ICD-10-CM

## 2012-07-10 DIAGNOSIS — J449 Chronic obstructive pulmonary disease, unspecified: Secondary | ICD-10-CM

## 2012-07-10 DIAGNOSIS — R079 Chest pain, unspecified: Principal | ICD-10-CM | POA: Diagnosis present

## 2012-07-10 DIAGNOSIS — F17200 Nicotine dependence, unspecified, uncomplicated: Secondary | ICD-10-CM | POA: Insufficient documentation

## 2012-07-10 DIAGNOSIS — I10 Essential (primary) hypertension: Secondary | ICD-10-CM | POA: Insufficient documentation

## 2012-07-10 DIAGNOSIS — I519 Heart disease, unspecified: Secondary | ICD-10-CM

## 2012-07-10 DIAGNOSIS — J4489 Other specified chronic obstructive pulmonary disease: Secondary | ICD-10-CM | POA: Insufficient documentation

## 2012-07-10 DIAGNOSIS — K219 Gastro-esophageal reflux disease without esophagitis: Secondary | ICD-10-CM | POA: Insufficient documentation

## 2012-07-10 HISTORY — DX: Unspecified viral hepatitis C without hepatic coma: B19.20

## 2012-07-10 HISTORY — DX: Chronic obstructive pulmonary disease, unspecified: J44.9

## 2012-07-10 LAB — CBC WITH DIFFERENTIAL/PLATELET
Basophils Relative: 1 % (ref 0–1)
Eosinophils Absolute: 0.1 10*3/uL (ref 0.0–0.7)
HCT: 41.4 % (ref 39.0–52.0)
Hemoglobin: 15.2 g/dL (ref 13.0–17.0)
Lymphocytes Relative: 18 % (ref 12–46)
Monocytes Relative: 7 % (ref 3–12)
Neutro Abs: 7.1 10*3/uL (ref 1.7–7.7)
Neutrophils Relative %: 73 % (ref 43–77)
RBC: 4.81 MIL/uL (ref 4.22–5.81)
WBC: 9.8 10*3/uL (ref 4.0–10.5)

## 2012-07-10 LAB — COMPREHENSIVE METABOLIC PANEL
ALT: 39 U/L (ref 0–53)
AST: 43 U/L — ABNORMAL HIGH (ref 0–37)
Alkaline Phosphatase: 62 U/L (ref 39–117)
CO2: 21 mEq/L (ref 19–32)
Calcium: 9.2 mg/dL (ref 8.4–10.5)
GFR calc Af Amer: >90 mL/min (ref >90)
GFR calc non Af Amer: >90 mL/min (ref >90)
Glucose, Bld: 109 mg/dL — ABNORMAL HIGH (ref 70–99)
Potassium: 4.1 mEq/L (ref 3.5–5.1)
Sodium: 124 mEq/L — ABNORMAL LOW (ref 135–145)
Total Protein: 7.4 g/dL (ref 6.0–8.3)

## 2012-07-10 LAB — PRO B NATRIURETIC PEPTIDE: Pro B Natriuretic peptide (BNP): 88.9 pg/mL (ref 0–125)

## 2012-07-10 LAB — POCT I-STAT TROPONIN I: Troponin i, poc: 0 ng/mL (ref 0.00–0.08)

## 2012-07-10 LAB — D-DIMER, QUANTITATIVE: D-Dimer, Quant: 0.35 ug/mL-FEU (ref 0.00–0.48)

## 2012-07-10 LAB — APTT: aPTT: 29 seconds (ref 24–37)

## 2012-07-10 LAB — PHOSPHORUS: Phosphorus: 2.9 mg/dL (ref 2.3–4.6)

## 2012-07-10 MED ORDER — AMLODIPINE BESYLATE 10 MG PO TABS
10.0000 mg | ORAL_TABLET | Freq: Every day | ORAL | Status: DC
  Administered 2012-07-11: 10 mg via ORAL
  Filled 2012-07-10: qty 1

## 2012-07-10 MED ORDER — HYDROCODONE-ACETAMINOPHEN 5-325 MG PO TABS: 1.0000 | ORAL_TABLET | ORAL | Status: DC | PRN

## 2012-07-10 MED ORDER — SODIUM CHLORIDE 0.9 % IV SOLN
INTRAVENOUS | Status: DC
  Administered 2012-07-10 – 2012-07-11 (×2): via INTRAVENOUS

## 2012-07-10 MED ORDER — ONDANSETRON HCL 4 MG PO TABS: 4.0000 mg | ORAL_TABLET | Freq: Four times a day (QID) | ORAL | Status: DC | PRN

## 2012-07-10 MED ORDER — SODIUM CHLORIDE 0.9 % IV BOLUS (SEPSIS)
1000.0000 mL | Freq: Once | INTRAVENOUS | Status: AC
  Administered 2012-07-10: 1000 mL via INTRAVENOUS

## 2012-07-10 MED ORDER — ASPIRIN 81 MG PO CHEW
324.0000 mg | CHEWABLE_TABLET | Freq: Once | ORAL | Status: AC
  Administered 2012-07-10: 324 mg via ORAL
  Filled 2012-07-10: qty 4

## 2012-07-10 MED ORDER — SODIUM CHLORIDE 0.9 % IJ SOLN: 3.0000 mL | Freq: Two times a day (BID) | INTRAMUSCULAR | Status: DC

## 2012-07-10 MED ORDER — SODIUM CHLORIDE 0.9 % IV SOLN
INTRAVENOUS | Status: AC
  Administered 2012-07-10: 14:00:00 via INTRAVENOUS

## 2012-07-10 MED ORDER — NITROGLYCERIN 0.4 MG SL SUBL
0.4000 mg | SUBLINGUAL_TABLET | SUBLINGUAL | Status: DC | PRN
  Administered 2012-07-10: 0.4 mg via SUBLINGUAL
  Filled 2012-07-10: qty 25

## 2012-07-10 MED ORDER — HYDROCHLOROTHIAZIDE 25 MG PO TABS
25.0000 mg | ORAL_TABLET | Freq: Every day | ORAL | Status: DC
  Administered 2012-07-11: 25 mg via ORAL
  Filled 2012-07-10: qty 1

## 2012-07-10 MED ORDER — ONDANSETRON HCL 4 MG/2ML IJ SOLN: 4.0000 mg | Freq: Four times a day (QID) | INTRAMUSCULAR | Status: DC | PRN

## 2012-07-10 MED ORDER — LORAZEPAM 0.5 MG PO TABS: 0.5000 mg | ORAL_TABLET | Freq: Two times a day (BID) | ORAL | Status: DC | PRN

## 2012-07-10 MED ORDER — LORAZEPAM 0.5 MG PO TABS
0.5000 mg | ORAL_TABLET | Freq: Every evening | ORAL | Status: DC | PRN
  Administered 2012-07-10: 0.5 mg via ORAL
  Filled 2012-07-10: qty 1

## 2012-07-10 NOTE — H&P (Signed)
[facility] History and Physical  George Nielsen QQV:[id number] DOB: 09-15-1955 DOA: 07/10/2012  Referring physician: ER physician PCP: Evette Georges, MD   Chief Complaint: chest pain  HPI:  57 year old male with past medical history of HTN, active smoker who presented to [facility] ED 07/10/12 due to ongoing chest pain, retrosternal, non radiating, 8-9/10 in intensity lasting for past couple of days, intermittent and when is does occur it is shapr in nature and associated with sweating. As mentioned, pain does not radiate to left arm or jaw. Patient reports no fever or chills, no cough, no shortness of breath. He did have nausea and 1 episode of non bloody vomiting. No lightheadedness or loss of consciousness. In ED, patient was hemodynamically stable. CXR revealed COPD but no acute cardiopulmonary disease. His BMET revealed hyponatremia of 124. Cardiac enzymes x 1 set  Was WNL and 12 lead EKG showed NSR.  Assessment and Plan:  Principal Problem:   Chest pain - likely costochondritis, less likely ACS - the first set of cardiac enzymes is negative; EKG with NSR - follow up BNP and 2 D ECHO Active Problems:   HTN (hypertension) - restart home meds   Hyponatremia - likely dehydration versus Hctz pt is taking as home med - continue IV fluids for now - Follow up BMP in am   COPD (chronic obstructive pulmonary disease) - stable   Code Status: Full Family Communication: Pt at bedside Disposition Plan: Admit under obs for further evaluation  Manson Passey, MD  [facility] Pager [phone]  If 7PM-7AM, please contact night-coverage [email or web address] Password [email or web address] 07/10/2012, 7:15 PM   Review of Systems:  Constitutional: Negative for fever, chills and malaise/fatigue. positive for diaphoresis.  HENT: Negative for hearing loss, ear pain, nosebleeds, congestion, sore throat, neck pain, tinnitus and ear discharge.   Eyes: Negative for blurred vision, double vision, photophobia, pain,  discharge and redness.  Respiratory: Negative for cough, hemoptysis, sputum production, shortness of breath, wheezing and stridor.   Cardiovascular: positive for chest pain, no palpitations, orthopnea, claudication and leg swelling.  Gastrointestinal: Negative for nausea, vomiting and abdominal pain. Negative for heartburn, constipation, blood in stool and melena.  Genitourinary: Negative for dysuria, urgency, frequency, hematuria and flank pain.  Musculoskeletal: Negative for myalgias, back pain, joint pain and falls.  Skin: Negative for itching and rash.  Neurological: Negative for dizziness and weakness. Negative for tingling, tremors, sensory change, speech change, focal weakness, loss of consciousness and headaches.  Endo/Heme/Allergies: Negative for environmental allergies and polydipsia. Does not bruise/bleed easily.  Psychiatric/Behavioral: Negative for suicidal ideas. The patient is not nervous/anxious.      Past Medical History  Diagnosis Date  . Allergy   . Hypertension   . GERD (gastroesophageal reflux disease)   . Ulcer   . Hepatitis C   . COPD (chronic obstructive pulmonary disease)    Past Surgical History  Procedure Laterality Date  . Tonsillectomy    . Back surgery    . Shoulder surgery     Social History:  reports that he has been smoking Cigarettes.  He has been smoking about 0.00 packs per day. He has never used smokeless tobacco. He reports that  drinks alcohol. He reports that he does not use illicit drugs.  Allergies  Allergen Reactions  . Bee Venom Swelling  . Poison Ivy Extract (Extract Of Poison Ivy) Rash    Family History:  Family History  Problem Relation Age of Onset  . Cancer Mother     breast  .  Heart disease Father      Prior to Admission medications   Medication Sig Start Date End Date Taking? Authorizing Provider  acetaminophen (TYLENOL) 650 MG CR tablet Take 650 mg by mouth every 8 (eight) hours as needed for pain.   Yes Historical  Provider, MD  amLODipine (NORVASC) 10 MG tablet Take 10 mg by mouth daily.   Yes Historical Provider, MD  beclomethasone (QVAR) 80 MCG/ACT inhaler Inhale 1 puff into the lungs daily.   Yes Historical Provider, MD  cetirizine (ZYRTEC) 10 MG tablet Take 10 mg by mouth daily as needed for allergies.   Yes Historical Provider, MD  hydrochlorothiazide 25 MG tablet Take 1 tablet (25 mg total) by mouth daily. 11/01/10  Yes Roderick Pee, MD  lisinopril (PRINIVIL,ZESTRIL) 20 MG tablet Take 20 mg by mouth daily.   Yes Historical Provider, MD  POTASSIUM PO Take 1 tablet by mouth daily.   Yes Historical Provider, MD  tiZANidine (ZANAFLEX) 4 MG tablet Take 4 mg by mouth every 8 (eight) hours as needed (muscle spasms.).   Yes Historical Provider, MD   Physical Exam: Filed Vitals:   07/10/12 1250 07/10/12 1255 07/10/12 1341 07/10/12 1513  BP: 141/89 145/86 145/86 127/82  Pulse: 91 91 85 92  Temp:    99.7 F (37.6 C)  TempSrc:    Oral  Resp: 20 17 17 18   Height:    6' (1.829 m)  Weight:    86.773 kg (191 lb 4.8 oz)  SpO2: 100% 100%  98%    Physical Exam  Constitutional: Appears well-developed and well-nourished. No distress.  HENT: Normocephalic. External right and left ear normal. Oropharynx is clear and moist.  Eyes: Conjunctivae and EOM are normal. PERRLA, no scleral icterus.  Neck: Normal ROM. Neck supple. No JVD. No tracheal deviation. No thyromegaly.  CVS: RRR, S1/S2 +, no murmurs, no gallops, no carotid bruit.  Pulmonary: Effort and breath sounds normal, no stridor, rhonchi, wheezes, rales.  Abdominal: Soft. BS +,  no distension, tenderness, rebound or guarding.  Musculoskeletal: Normal range of motion. No edema and no tenderness.  Lymphadenopathy: No lymphadenopathy noted, cervical, inguinal. Neuro: Alert. Normal reflexes, muscle tone coordination. No cranial nerve deficit. Skin: Skin is warm and dry. No rash noted. Not diaphoretic. No erythema. No pallor.  Psychiatric: Normal mood and  affect. Behavior, judgment, thought content normal.   Labs on Admission:  Basic Metabolic Panel:  Recent Labs Lab 07/10/12 1210 07/10/12 1530  NA 124*  --   K 4.1  --   CL 92*  --   CO2 21  --   GLUCOSE 109*  --   BUN 8  --   CREATININE 0.80  --   CALCIUM 9.2  --   MG  --  2.0  PHOS  --  2.9   Liver Function Tests:  Recent Labs Lab 07/10/12 1210  AST 43*  ALT 39  ALKPHOS 62  BILITOT 0.5  PROT 7.4  ALBUMIN 4.0   No results found for this basename: LIPASE, AMYLASE,  in the last 168 hours No results found for this basename: AMMONIA,  in the last 168 hours CBC:  Recent Labs Lab 07/10/12 1210  WBC 9.8  NEUTROABS 7.1  HGB 15.2  HCT 41.4  MCV 86.1  PLT 271   Cardiac Enzymes:  Recent Labs Lab 07/10/12 1530  TROPONINI <0.30   BNP: No components found with this basename: POCBNP,  CBG: No results found for this basename: GLUCAP,  in the  last 168 hours  Radiological Exams on Admission: Dg Chest 2 View  07/10/2012  *RADIOLOGY REPORT*  Clinical Data: Chest pain.  History of COPD.  CHEST - 2 VIEW  Comparison: PA and lateral chest 02/08/2010.  Findings: The chest is hyperexpanded with attenuation of the pulmonary vasculature.  There is no airspace disease or effusion. No pneumothorax identified.  Heart size is normal.  IMPRESSION: COPD without acute abnormality.   Original Report Authenticated By: Holley Dexter, M.D.     EKG: Normal sinus rhythm, no ST/T wave changes  Time spent: 75 minutes

## 2012-07-10 NOTE — ED Provider Notes (Signed)
History     CSN: [id number]  Arrival date & time 07/10/12  1149   First MD Initiated Contact with Patient 07/10/12 1155      Chief Complaint  Patient presents with  . Chest Pain    (Consider location/radiation/quality/duration/timing/severity/associated sxs/prior treatment) Patient is a 57 y.o. male presenting with chest pain. The history is provided by the patient.  Chest Pain Pain location:  Substernal area Pain quality: aching and tightness   Pain radiates to:  Does not radiate Pain radiates to the back: no   Pain severity:  Moderate Onset quality:  Sudden Duration: states episodes last between 10-78min. Timing:  Intermittent Progression since onset: states episodes are occuring at least 1 time a day for the last week but no happening several times a day. Chronicity:  New Context comment:  States can happen at any time Relieved by:  Nothing (states he does not get the episodes while he is sleeping but does not seem to be affected by eating or lying down) Ineffective treatments:  None tried Associated symptoms: diaphoresis, dizziness, nausea, near-syncope, shortness of breath and weakness   Associated symptoms: no abdominal pain and not vomiting   Risk factors: high cholesterol, hypertension, male sex and smoking   Risk factors: no coronary artery disease, no diabetes mellitus, no prior DVT/PE and no surgery   Risk factors comment:  Father with MI at 11   Past Medical History  Diagnosis Date  . Allergy   . Hypertension   . GERD (gastroesophageal reflux disease)   . Ulcer   . Hepatitis C     Past Surgical History  Procedure Laterality Date  . Tonsillectomy    . Back surgery    . Shoulder surgery      Family History  Problem Relation Age of Onset  . Cancer Mother     breast  . Heart disease Father     History  Substance Use Topics  . Smoking status: Current Every Day Smoker  . Smokeless tobacco: Not on file  . Alcohol Use: Yes      Review of  Systems  Constitutional: Positive for diaphoresis.  Respiratory: Positive for shortness of breath.   Cardiovascular: Positive for chest pain and near-syncope.  Gastrointestinal: Positive for nausea. Negative for vomiting and abdominal pain.  Neurological: Positive for dizziness and weakness.  All other systems reviewed and are negative.    Allergies  Review of patient's allergies indicates no known allergies.  Home Medications   Current Outpatient Rx  Name  Route  Sig  Dispense  Refill  . amLODipine (NORVASC) 5 MG tablet   Oral   Take 1 tablet (5 mg total) by mouth daily.   90 tablet   1   . hydrochlorothiazide 25 MG tablet   Oral   Take 1 tablet (25 mg total) by mouth daily.   90 tablet   1     BP 182/94  Pulse 109  Temp(Src) 98.6 F (37 C) (Oral)  Resp 24  SpO2 100%  Physical Exam  Nursing note and vitals reviewed. Constitutional: He is oriented to person, place, and time. He appears well-developed and well-nourished. No distress.  HENT:  Head: Normocephalic and atraumatic.  Mouth/Throat: Oropharynx is clear and moist.  Eyes: Conjunctivae and EOM are normal. Pupils are equal, round, and reactive to light.  Neck: Normal range of motion. Neck supple.  Cardiovascular: Regular rhythm and intact distal pulses.  Tachycardia present.   No murmur heard. Pulmonary/Chest: Effort normal and breath  sounds normal. No respiratory distress. He has no wheezes. He has no rales.  Abdominal: Soft. He exhibits no distension. There is no tenderness. There is no rebound and no guarding.  Musculoskeletal: Normal range of motion. He exhibits no edema and no tenderness.  No calf tenderness  Neurological: He is alert and oriented to person, place, and time.  Skin: Skin is warm and dry. No rash noted. No erythema.  Psychiatric: He has a normal mood and affect. His behavior is normal.    ED Course  Procedures (including critical care time)  Labs Reviewed  CBC WITH DIFFERENTIAL -  Abnormal; Notable for the following:    MCHC 36.7 (*)    All other components within normal limits  COMPREHENSIVE METABOLIC PANEL - Abnormal; Notable for the following:    Sodium 124 (*)    Chloride 92 (*)    Glucose, Bld 109 (*)    AST 43 (*)    All other components within normal limits  PROTIME-INR  APTT  D-DIMER, QUANTITATIVE  POCT I-STAT TROPONIN I   Dg Chest 2 View  07/10/2012  *RADIOLOGY REPORT*  Clinical Data: Chest pain.  History of COPD.  CHEST - 2 VIEW  Comparison: PA and lateral chest 02/08/2010.  Findings: The chest is hyperexpanded with attenuation of the pulmonary vasculature.  There is no airspace disease or effusion. No pneumothorax identified.  Heart size is normal.  IMPRESSION: COPD without acute abnormality.   Original Report Authenticated By: Holley Dexter, M.D.      Date: 07/10/2012  Rate: 101  Rhythm: sinus tachycardia  QRS Axis: right  Intervals: normal  ST/T Wave abnormalities: normal  Conduction Disutrbances:first-degree A-V block   Narrative Interpretation:   Old EKG Reviewed: none available   No diagnosis found.    MDM   Pt with symptoms concerning for ACS.  TIMI 2 for risk factors and multiple episodes in the last 24hrs . Associated symptoms include nausea, diaphoresis, lightheaded and SOB.  No PE risk factors such as long travel or immobilization, surgeries or unilateral leg pain.  Does c/o of diffuse intermittent muscle cramps.  Currently c/o of very mild pain.   ASA and NTG given. EKG with Sinus Tachy and 1st degree AV block but no ST changes.  CXR, CBC, BMP, CE, Coags pending.  1:35 PM CXR, CBC, Troponin, Coags and CXR wnl. D-dimer neg.  BMP with hyponatremia which is new at 124 from unknown etiology but could be from HCTZ but most likely the cause of pt's muscle cramps but unclear if this is the cause of CP.  Will admit for hyponatremia and CP r/o.       Gwyneth Sprout, MD 07/10/12 [phone]

## 2012-07-10 NOTE — Progress Notes (Signed)
*  PRELIMINARY RESULTS* Echocardiogram 2D Echocardiogram has been performed.  Jeryl Columbia 07/10/2012, 3:52 PM

## 2012-07-10 NOTE — ED Notes (Signed)
Pt presents to ED with chest pain that began over 1 week ago, pain intermittent, cannot describe pain at this time. States pain located on central chest, does not radiate anywhere. Hx of HTN, current smoker

## 2012-07-10 NOTE — ED Notes (Signed)
4w called x1 for report, RN busy at this time

## 2012-07-11 LAB — CBC
MCH: 31.2 pg (ref 26.0–34.0)
MCHC: 35.6 g/dL (ref 30.0–36.0)
MCV: 87.6 fL (ref 78.0–100.0)
Platelets: 239 10*3/uL (ref 150–400)
RDW: 12.1 % (ref 11.5–15.5)

## 2012-07-11 LAB — COMPREHENSIVE METABOLIC PANEL
ALT: 30 U/L (ref 0–53)
AST: 32 U/L (ref 0–37)
Albumin: 3.3 g/dL — ABNORMAL LOW (ref 3.5–5.2)
Alkaline Phosphatase: 54 U/L (ref 39–117)
CO2: 24 mEq/L (ref 19–32)
Chloride: 97 mEq/L (ref 96–112)
Creatinine, Ser: 0.79 mg/dL (ref 0.50–1.35)
GFR calc non Af Amer: >90 mL/min (ref >90)
Potassium: 3.7 mEq/L (ref 3.5–5.1)
Sodium: 130 mEq/L — ABNORMAL LOW (ref 135–145)
Total Bilirubin: 0.5 mg/dL (ref 0.3–1.2)

## 2012-07-11 LAB — TROPONIN I: Troponin I: <0.3 ng/mL (ref <0.30)

## 2012-07-11 MED ORDER — NITROGLYCERIN 0.4 MG SL SUBL: 0.4000 mg | SUBLINGUAL_TABLET | SUBLINGUAL | Status: DC | PRN

## 2012-07-11 MED ORDER — AZITHROMYCIN 1 G PO PACK: 1.0000 | PACK | Freq: Once | ORAL | Status: DC

## 2012-07-11 MED ORDER — HYDROCODONE-ACETAMINOPHEN 5-325 MG PO TABS: 1.0000 | ORAL_TABLET | ORAL | Status: DC | PRN

## 2012-07-11 NOTE — Discharge Summary (Signed)
Physician Discharge Summary  George Nielsen:[id number] DOB: [birth year] DOA: 07/10/2012  PCP: Evette Georges, MD  Admit date: 07/10/2012 Discharge date: 07/11/2012  Recommendations for Outpatient Follow-up:  1. Pt will need to follow up with PCP in 2-3 weeks post discharge 2. Please obtain BMP to evaluate electrolytes and kidney function 3. Please also check CBC to evaluate Hg and Hct levels 4. Pt given information to follow up with cardiologist for consideration of stress test, pt will check with insurance which cardiology group will be covered, he has my cell phone number and will call me to let me know so that I can assist him with appropriate referral and set up  Discharge Diagnoses: Chest pain, ACS ruled out  Principal Problem:   Chest pain Active Problems:   COPD (chronic obstructive pulmonary disease)   HTN (hypertension)   Hyponatremia  Discharge Condition: Stable  Diet recommendation: Heart healthy diet discussed in details   History of present illness:  57 year old male with past medical history of HTN, active smoker who presented to [facility] ED 07/10/12 due to ongoing chest pain, retrosternal, non radiating, 8-9/10 in intensity lasting for past couple of days, intermittent and when is does occur it is shapr in nature and associated with sweating. As mentioned, pain does not radiate to left arm or jaw. Patient reports no fever or chills, no cough, no shortness of breath. He did have nausea and 1 episode of non bloody vomiting. No lightheadedness or loss of consciousness.   In ED, patient was hemodynamically stable. CXR revealed COPD but no acute cardiopulmonary disease. His BMET revealed hyponatremia of 124. Cardiac enzymes x 1 set Was WNL and 12 lead EKG showed NSR.   Assessment and Plan:  Principal Problem:  Chest pain  - likely costochondritis, less likely ACS, ruled out based on troponins and EKG findings, chest pain resolved this AM - 2 D ECHO with normal systolic  function and only grade I diastolic dysfunction - I discussed findings with cardiologist on call, recommendation was for outpatient follow up for consideration of stress test  Active Problems:  HTN (hypertension)  - continue home meds  Hyponatremia  - likely dehydration versus Hctz pt is taking as home med  COPD (chronic obstructive pulmonary disease)  - stable   Procedures/Studies: Dg Chest 2 View 07/10/2012  COPD without acute abnormality.   Consultations:  Cardiology over the phone  Antibiotics:  None  Discharge Exam: Filed Vitals:   07/11/12 0544  BP: 161/77  Pulse: 80  Temp: 98.5 F (36.9 C)  Resp: 16   Filed Vitals:   07/10/12 1341 07/10/12 1513 07/10/12 2123 07/11/12 0544  BP: 145/86 127/82 138/83 161/77  Pulse: 85 92 72 80  Temp:  99.7 F (37.6 C) 98.2 F (36.8 C) 98.5 F (36.9 C)  TempSrc:  Oral Oral Oral  Resp: 17 18 16 16   Height:  6' (1.829 m)    Weight:  86.773 kg (191 lb 4.8 oz)  86.1 kg (189 lb 13.1 oz)  SpO2:  98% 98% 100%    General: Pt is alert, follows commands appropriately, not in acute distress Cardiovascular: Regular rate and rhythm, S1/S2 +, no murmurs, no rubs, no gallops Respiratory: Clear to auscultation bilaterally, no wheezing, no crackles, no rhonchi Abdominal: Soft, non tender, non distended, bowel sounds +, no guarding Extremities: no edema, no cyanosis, pulses palpable bilaterally DP and PT Neuro: Grossly nonfocal  Discharge Instructions  Discharge Orders   Future Orders Complete By Expires  Diet - low sodium heart healthy  As directed     Increase activity slowly  As directed         Medication List    TAKE these medications       acetaminophen 650 MG CR tablet  Commonly known as:  TYLENOL  Take 650 mg by mouth every 8 (eight) hours as needed for pain.     amLODipine 10 MG tablet  Commonly known as:  NORVASC  Take 10 mg by mouth daily.     azithromycin 1 G powder  Commonly known as:  ZITHROMAX  Take 1  packet by mouth once.     beclomethasone 80 MCG/ACT inhaler  Commonly known as:  QVAR  Inhale 1 puff into the lungs daily.     cetirizine 10 MG tablet  Commonly known as:  ZYRTEC  Take 10 mg by mouth daily as needed for allergies.     hydrochlorothiazide 25 MG tablet  Commonly known as:  HYDRODIURIL  Take 1 tablet (25 mg total) by mouth daily.     HYDROcodone-acetaminophen 5-325 MG per tablet  Commonly known as:  NORCO/VICODIN  Take 1-2 tablets by mouth every 4 (four) hours as needed.     lisinopril 20 MG tablet  Commonly known as:  PRINIVIL,ZESTRIL  Take 20 mg by mouth daily.     nitroGLYCERIN 0.4 MG SL tablet  Commonly known as:  NITROSTAT  Place 1 tablet (0.4 mg total) under the tongue every 5 (five) minutes as needed for chest pain (do not take more than 3 tablets in a row).     POTASSIUM PO  Take 1 tablet by mouth daily.     tiZANidine 4 MG tablet  Commonly known as:  ZANAFLEX  Take 4 mg by mouth every 8 (eight) hours as needed (muscle spasms.).           Follow-up Information   Follow up with Marca Ancona, MD.   Contact information:   [address] [place] [address] [phone]       Follow up with TODD,JEFFREY Freida Busman, MD In 2 weeks.   Contact information:   [address] [place]ena Flake [place] [address] [phone]        The results of significant diagnostics from this hospitalization (including imaging, microbiology, ancillary and laboratory) are listed below for reference.     Microbiology: No results found for this or any previous visit (from the past 240 hour(s)).   Labs: Basic Metabolic Panel:  Recent Labs Lab 07/10/12 1210 07/10/12 1530 07/11/12 0303  NA 124*  --  130*  K 4.1  --  3.7  CL 92*  --  97  CO2 21  --  24  GLUCOSE 109*  --  91  BUN 8  --  9  CREATININE 0.80  --  0.79  CALCIUM 9.2  --  8.7  MG  --  2.0  --   PHOS  --  2.9  --    Liver Function Tests:  Recent Labs Lab  07/10/12 1210 07/11/12 0303  AST 43* 32  ALT 39 30  ALKPHOS 62 54  BILITOT 0.5 0.5  PROT 7.4 6.4  ALBUMIN 4.0 3.3*   No results found for this basename: LIPASE, AMYLASE,  in the last 168 hours No results found for this basename: AMMONIA,  in the last 168 hours CBC:  Recent Labs Lab 07/10/12 1210 07/11/12 0303  WBC 9.8 6.4  NEUTROABS 7.1  --  HGB 15.2 13.6  HCT 41.4 38.2*  MCV 86.1 87.6  PLT 271 239   Cardiac Enzymes:  Recent Labs Lab 07/10/12 1530 07/10/12 2048 07/11/12 0303  TROPONINI <0.30 <0.30 <0.30   BNP: BNP (last 3 results)  Recent Labs  07/10/12 1530  PROBNP 88.9   CBG: No results found for this basename: GLUCAP,  in the last 168 hours   SIGNED: Time coordinating discharge: Over 30 minutes  Debbora Presto, MD  [facility] 07/11/2012, 10:09 AM Pager [phone]  If 7PM-7AM, please contact night-coverage [email or web address] Password [email or web address]

## 2013-03-16 ENCOUNTER — Emergency Department (HOSPITAL_COMMUNITY)
Admission: EM | Admit: 2013-03-16 | Discharge: 2013-03-16 | Disposition: A | Discharge status: Home / Self Care | Payer: PRIVATE HEALTH INSURANCE | Attending: Emergency Medicine | Admitting: Emergency Medicine

## 2013-03-16 ENCOUNTER — Encounter (HOSPITAL_COMMUNITY): Payer: Self-pay | Admitting: Emergency Medicine

## 2013-03-16 ENCOUNTER — Emergency Department (HOSPITAL_COMMUNITY): Payer: PRIVATE HEALTH INSURANCE

## 2013-03-16 DIAGNOSIS — R300 Dysuria: Secondary | ICD-10-CM | POA: Insufficient documentation

## 2013-03-16 DIAGNOSIS — I10 Essential (primary) hypertension: Secondary | ICD-10-CM | POA: Insufficient documentation

## 2013-03-16 DIAGNOSIS — Z8619 Personal history of other infectious and parasitic diseases: Secondary | ICD-10-CM | POA: Insufficient documentation

## 2013-03-16 DIAGNOSIS — Z872 Personal history of diseases of the skin and subcutaneous tissue: Secondary | ICD-10-CM | POA: Insufficient documentation

## 2013-03-16 DIAGNOSIS — R5381 Other malaise: Secondary | ICD-10-CM | POA: Insufficient documentation

## 2013-03-16 DIAGNOSIS — K219 Gastro-esophageal reflux disease without esophagitis: Secondary | ICD-10-CM | POA: Insufficient documentation

## 2013-03-16 DIAGNOSIS — Z8701 Personal history of pneumonia (recurrent): Secondary | ICD-10-CM | POA: Insufficient documentation

## 2013-03-16 DIAGNOSIS — IMO0002 Reserved for concepts with insufficient information to code with codable children: Secondary | ICD-10-CM | POA: Insufficient documentation

## 2013-03-16 DIAGNOSIS — F17200 Nicotine dependence, unspecified, uncomplicated: Secondary | ICD-10-CM | POA: Insufficient documentation

## 2013-03-16 DIAGNOSIS — Z79899 Other long term (current) drug therapy: Secondary | ICD-10-CM | POA: Insufficient documentation

## 2013-03-16 DIAGNOSIS — Z792 Long term (current) use of antibiotics: Secondary | ICD-10-CM | POA: Insufficient documentation

## 2013-03-16 DIAGNOSIS — J441 Chronic obstructive pulmonary disease with (acute) exacerbation: Secondary | ICD-10-CM | POA: Insufficient documentation

## 2013-03-16 HISTORY — DX: Pneumonia, unspecified organism: J18.9

## 2013-03-16 LAB — BASIC METABOLIC PANEL
BUN: 12 mg/dL (ref 6–23)
CO2: 26 mEq/L (ref 19–32)
Calcium: 8.9 mg/dL (ref 8.4–10.5)
GFR calc non Af Amer: >90 mL/min (ref >90)
Glucose, Bld: 98 mg/dL (ref 70–99)
Potassium: 4 mEq/L (ref 3.5–5.1)

## 2013-03-16 LAB — CBC WITH DIFFERENTIAL/PLATELET
Eosinophils Absolute: 0 10*3/uL (ref 0.0–0.7)
Eosinophils Relative: 0 % (ref 0–5)
Hemoglobin: 14.9 g/dL (ref 13.0–17.0)
Lymphocytes Relative: 28 % (ref 12–46)
Lymphs Abs: 1.5 10*3/uL (ref 0.7–4.0)
MCH: 32.8 pg (ref 26.0–34.0)
MCV: 92.3 fL (ref 78.0–100.0)
Monocytes Relative: 17 % — ABNORMAL HIGH (ref 3–12)
RBC: 4.54 MIL/uL (ref 4.22–5.81)
WBC: 5.6 10*3/uL (ref 4.0–10.5)

## 2013-03-16 LAB — URINALYSIS, ROUTINE W REFLEX MICROSCOPIC
Glucose, UA: NEGATIVE mg/dL
Ketones, ur: NEGATIVE mg/dL
Leukocytes, UA: NEGATIVE
Nitrite: NEGATIVE
Specific Gravity, Urine: 1.021 (ref 1.005–1.030)
pH: 7 (ref 5.0–8.0)

## 2013-03-16 LAB — URINE MICROSCOPIC-ADD ON

## 2013-03-16 LAB — POCT I-STAT TROPONIN I

## 2013-03-16 MED ORDER — PREDNISONE 20 MG PO TABS
60.0000 mg | ORAL_TABLET | Freq: Once | ORAL | Status: AC
  Administered 2013-03-16: 60 mg via ORAL
  Filled 2013-03-16: qty 3

## 2013-03-16 MED ORDER — PREDNISONE 10 MG PO TABS: 20.0000 mg | ORAL_TABLET | Freq: Every day | ORAL | Status: DC

## 2013-03-16 MED ORDER — IPRATROPIUM BROMIDE 0.02 % IN SOLN
0.5000 mg | Freq: Once | RESPIRATORY_TRACT | Status: AC
  Administered 2013-03-16: 0.5 mg via RESPIRATORY_TRACT
  Filled 2013-03-16: qty 2.5

## 2013-03-16 MED ORDER — ALBUTEROL SULFATE (5 MG/ML) 0.5% IN NEBU
2.5000 mg | INHALATION_SOLUTION | Freq: Once | RESPIRATORY_TRACT | Status: AC
  Administered 2013-03-16: 2.5 mg via RESPIRATORY_TRACT
  Filled 2013-03-16: qty 0.5

## 2013-03-16 NOTE — ED Notes (Signed)
Bed: WA25 Expected date:  Expected time:  Means of arrival:  Comments: Closed 

## 2013-03-16 NOTE — ED Notes (Addendum)
Pt c/o SOB, congestion, and weakness x "18 weeks."  Pt sts he has been seen several times for some complaint and none of the medications are working.  Sts he was originally diagnosed with PNA and has had multiple chest x-rays and a CT.  Hx of COPD.    Pt would, also, like to be evaluated for an UTI.  Sts "weird sensation and relief after urinating."

## 2013-03-16 NOTE — ED Notes (Signed)
Patient transported to X-ray 

## 2013-03-17 LAB — URINE CULTURE: Colony Count: NO GROWTH

## 2013-03-17 NOTE — ED Provider Notes (Signed)
CSN: [id number]     Arrival date & time 03/16/13  4098 History   First MD Initiated Contact with Patient 03/16/13 1015     Chief Complaint  Patient presents with  . Shortness of Breath  . Weakness   (Consider location/radiation/quality/duration/timing/severity/associated sxs/prior Treatment) HPI  THis is a 57 yo with hx of COPD, HTN, and Hep C who presents with SOB and congestion.  Patient reports being treated by his PCP multiple times over the last 4 months for "pneumonia."  He is currently on Levaquin and inhalers but no steroids.  Patient reports that this only thing that ever makes him better is "a shot in the bottom."  Patient states "I'm just not getting any better." Reports having multiple chest xrays and a CT scan last week. Denies fevers.  Reports nonproductive cough and SOB.  Also reports "tingling" with urination and would like to be evaluated for a UTI.  He denies any new sexual partners and denies sexual activity.  Past Medical History  Diagnosis Date  . Allergy   . Hypertension   . GERD (gastroesophageal reflux disease)   . Ulcer   . Hepatitis C   . COPD (chronic obstructive pulmonary disease)   . PNA (pneumonia)    Past Surgical History  Procedure Laterality Date  . Tonsillectomy    . Back surgery    . Shoulder surgery     Family History  Problem Relation Age of Onset  . Cancer Mother     breast  . Heart disease Father    History  Substance Use Topics  . Smoking status: Current Every Day Smoker -- 0.50 packs/day    Types: Cigarettes  . Smokeless tobacco: Never Used  . Alcohol Use: Yes    Review of Systems  Constitutional: Negative for fever.  HENT: Positive for congestion.   Respiratory: Positive for cough and shortness of breath. Negative for chest tightness.   Cardiovascular: Negative.  Negative for chest pain.  Gastrointestinal: Negative.  Negative for abdominal pain.  Genitourinary: Positive for dysuria.  Musculoskeletal: Negative for back pain.   Skin: Negative for rash.  Neurological: Positive for weakness. Negative for headaches.  All other systems reviewed and are negative.    Allergies  Bee venom and Poison ivy extract  Home Medications   Current Outpatient Rx  Name  Route  Sig  Dispense  Refill  . acetaminophen (TYLENOL) 650 MG CR tablet   Oral   Take 650 mg by mouth every 8 (eight) hours as needed for pain.         Marland Kitchen albuterol (PROVENTIL HFA;VENTOLIN HFA) 108 (90 BASE) MCG/ACT inhaler   Inhalation   Inhale 2 puffs into the lungs every 6 (six) hours as needed for wheezing or shortness of breath.         Marland Kitchen amLODipine (NORVASC) 10 MG tablet   Oral   Take 10 mg by mouth daily.         . Azelastine-Fluticasone (DYMISTA) 137-50 MCG/ACT SUSP   Nasal   Place 1 spray into the nose daily.         . budesonide-formoterol (SYMBICORT) 160-4.5 MCG/ACT inhaler   Inhalation   Inhale 2 puffs into the lungs 2 (two) times daily.         Marland Kitchen levofloxacin (LEVAQUIN) 750 MG tablet   Oral   Take 750 mg by mouth daily.         Marland Kitchen lisinopril (PRINIVIL,ZESTRIL) 20 MG tablet   Oral   Take 20  mg by mouth daily.         . Magnesium 100 MG TABS   Oral   Take 50 mg by mouth daily.         Marland Kitchen POTASSIUM PO   Oral   Take 1 tablet by mouth daily.         . nitroGLYCERIN (NITROSTAT) 0.4 MG SL tablet   Sublingual   Place 1 tablet (0.4 mg total) under the tongue every 5 (five) minutes as needed for chest pain (do not take more than 3 tablets in a row).   30 tablet   0   . predniSONE (DELTASONE) 10 MG tablet   Oral   Take 2 tablets (20 mg total) by mouth daily.   10 tablet   0    BP 155/83  Pulse 72  Temp(Src) 98.8 F (37.1 C) (Oral)  Resp 20  SpO2 94% Physical Exam  Nursing note and vitals reviewed. Constitutional: He is oriented to person, place, and time. He appears well-developed and well-nourished. No distress.  HENT:  Head: Normocephalic and atraumatic.  Mouth/Throat: Oropharynx is clear and  moist.  Eyes: Pupils are equal, round, and reactive to light.  Neck: Neck supple.  Cardiovascular: Normal rate, regular rhythm and normal heart sounds.   No murmur heard. Pulmonary/Chest: Effort normal. No respiratory distress. He has wheezes.  Scant expiratory wheeze  Abdominal: Soft. There is no tenderness.  Musculoskeletal: He exhibits no edema.  Lymphadenopathy:    He has no cervical adenopathy.  Neurological: He is alert and oriented to person, place, and time.  Skin: Skin is warm and dry.  Psychiatric: He has a normal mood and affect.    ED Course  Procedures (including critical care time) Labs Review Labs Reviewed  CBC WITH DIFFERENTIAL - Abnormal; Notable for the following:    Monocytes Relative 17 (*)    All other components within normal limits  BASIC METABOLIC PANEL - Abnormal; Notable for the following:    Sodium 134 (*)    All other components within normal limits  URINALYSIS, ROUTINE W REFLEX MICROSCOPIC - Abnormal; Notable for the following:    Protein, ur 100 (*)    All other components within normal limits  URINE MICROSCOPIC-ADD ON - Abnormal; Notable for the following:    Casts HYALINE CASTS (*)    All other components within normal limits  URINE CULTURE  POCT I-STAT TROPONIN I   Imaging Review Dg Chest 2 View  03/16/2013   CLINICAL DATA:  Chest congestion, cough, smoker  EXAM: CHEST  2 VIEW  COMPARISON:  07/10/2012  FINDINGS: The heart size and mediastinal contours are within normal limits. Both lungs are clear. The visualized skeletal structures are unremarkable. Stable hyperinflation compatible with COPD/emphysema. .  IMPRESSION: Hyperinflation.  No interval change.  No acute finding.   Electronically Signed   By: Ruel Favors M.D.   On: 03/16/2013 10:59    EKG Interpretation    Date/Time:  Monday March 16 2013 10:08:59 EST Ventricular Rate:  88 PR Interval:  183 QRS Duration: 95 QT Interval:  367 QTC Calculation: 444 R Axis:   180 Text  Interpretation:  Sinus rhythm Right axis deviation Confirmed by Jorden Mahl  MD, Phiona Ramnauth ([id number]) on 03/16/2013 10:22:06 AM            MDM   1. COPD exacerbation    Patient presents with persistent SOB and cough.  NOntoxic on exam and vs reassuring.  NO chest pain.  EKG nonischemic.  Suspect persistence of symptoms is related to COPD and patient continues to smoke.  CHest xray neg.  W/u unremarkable.  Urine with few bacteria and 0-2 Leuks.  Patient declined being swabbed for STDs or empirically treated.  WIll send culture.  WIll add burst dose prednisone to patients current antibiotic and have him follow-up with PCP.  After history, exam, and medical workup I feel the patient has been appropriately medically screened and is safe for discharge home. Pertinent diagnoses were discussed with the patient. Patient was given return precautions.   Shon Baton, MD 03/17/13 [phone]

## 2014-09-28 ENCOUNTER — Emergency Department (HOSPITAL_COMMUNITY)
Admission: EM | Admit: 2014-09-28 | Discharge: 2014-09-28 | Disposition: A | Discharge status: Home / Self Care | Payer: PRIVATE HEALTH INSURANCE | Attending: Emergency Medicine | Admitting: Emergency Medicine

## 2014-09-28 ENCOUNTER — Emergency Department (HOSPITAL_COMMUNITY): Payer: PRIVATE HEALTH INSURANCE

## 2014-09-28 DIAGNOSIS — Z8619 Personal history of other infectious and parasitic diseases: Secondary | ICD-10-CM | POA: Insufficient documentation

## 2014-09-28 DIAGNOSIS — I10 Essential (primary) hypertension: Secondary | ICD-10-CM | POA: Insufficient documentation

## 2014-09-28 DIAGNOSIS — Z7952 Long term (current) use of systemic steroids: Secondary | ICD-10-CM | POA: Diagnosis not present

## 2014-09-28 DIAGNOSIS — Z79899 Other long term (current) drug therapy: Secondary | ICD-10-CM | POA: Diagnosis not present

## 2014-09-28 DIAGNOSIS — Z872 Personal history of diseases of the skin and subcutaneous tissue: Secondary | ICD-10-CM | POA: Insufficient documentation

## 2014-09-28 DIAGNOSIS — Y9389 Activity, other specified: Secondary | ICD-10-CM | POA: Diagnosis not present

## 2014-09-28 DIAGNOSIS — Y9289 Other specified places as the place of occurrence of the external cause: Secondary | ICD-10-CM | POA: Diagnosis not present

## 2014-09-28 DIAGNOSIS — W1839XA Other fall on same level, initial encounter: Secondary | ICD-10-CM | POA: Insufficient documentation

## 2014-09-28 DIAGNOSIS — S2241XA Multiple fractures of ribs, right side, initial encounter for closed fracture: Secondary | ICD-10-CM | POA: Diagnosis not present

## 2014-09-28 DIAGNOSIS — S2231XA Fracture of one rib, right side, initial encounter for closed fracture: Secondary | ICD-10-CM

## 2014-09-28 DIAGNOSIS — Z7951 Long term (current) use of inhaled steroids: Secondary | ICD-10-CM | POA: Insufficient documentation

## 2014-09-28 DIAGNOSIS — Z8701 Personal history of pneumonia (recurrent): Secondary | ICD-10-CM | POA: Diagnosis not present

## 2014-09-28 DIAGNOSIS — Z720 Tobacco use: Secondary | ICD-10-CM | POA: Diagnosis not present

## 2014-09-28 DIAGNOSIS — J449 Chronic obstructive pulmonary disease, unspecified: Secondary | ICD-10-CM | POA: Diagnosis not present

## 2014-09-28 DIAGNOSIS — S299XXA Unspecified injury of thorax, initial encounter: Secondary | ICD-10-CM | POA: Diagnosis present

## 2014-09-28 DIAGNOSIS — K219 Gastro-esophageal reflux disease without esophagitis: Secondary | ICD-10-CM | POA: Insufficient documentation

## 2014-09-28 DIAGNOSIS — Z792 Long term (current) use of antibiotics: Secondary | ICD-10-CM | POA: Insufficient documentation

## 2014-09-28 DIAGNOSIS — Y998 Other external cause status: Secondary | ICD-10-CM | POA: Diagnosis not present

## 2014-09-28 MED ORDER — DIAZEPAM 2 MG PO TABS: 2.0000 mg | ORAL_TABLET | Freq: Four times a day (QID) | ORAL | Status: DC | PRN

## 2014-09-28 MED ORDER — OXYCODONE-ACETAMINOPHEN 7.5-325 MG PO TABS: 1.0000 | ORAL_TABLET | ORAL | Status: DC | PRN

## 2014-09-28 NOTE — ED Notes (Signed)
Pt states he was trying to save a chipmunk from his cat 4 days ago and fell.  C/O RT rib pain.  Pain increases with deep breath, cough, hiccups and palpation.  States he was seen at urgent care, had xrays done but they couldn't confirm anything.  States he is supposed to have a biopsy on throat in 2 days and can't take the meloxicam he was prescribed.

## 2014-09-28 NOTE — Discharge Instructions (Signed)
Incentive Spirometer  An incentive spirometer is a tool that can help keep your lungs clear and active. This tool measures how well you are filling your lungs with each breath. Taking long, deep breaths may help reverse or decrease the chance of developing breathing (pulmonary) problems (especially infection) following:  · Surgery of the chest or abdomen.  · Surgery if you have a history of smoking or a lung problem.  · A long period of time when you are unable to move or be active.  BEFORE THE PROCEDURE   · If the spirometer includes an indicator to show your best effort, your nurse or respiratory therapist will set it to a desired goal.  · If possible, sit up straight or lean slightly forward. Try not to slouch.  · Hold the incentive spirometer in an upright position.  INSTRUCTIONS FOR USE   1. Sit on the edge of your bed if possible, or sit up as far as you can in bed or on a chair.  2. Hold the incentive spirometer in an upright position.  3. Breathe out normally.  4. Place the mouthpiece in your mouth and seal your lips tightly around it.  5. Breathe in slowly and as deeply as possible, raising the piston or the ball toward the top of the column.  6. Hold your breath for 3-5 seconds or for as long as possible. Allow the piston or ball to fall to the bottom of the column.  7. Remove the mouthpiece from your mouth and breathe out normally.  8. Rest for a few seconds and repeat Steps 1 through 7 at least 10 times every 1-2 hours when you are awake. Take your time and take a few normal breaths between deep breaths.  9. The spirometer may include an indicator to show your best effort. Use the indicator as a goal to work toward during each repetition.  10. After each set of 10 deep breaths, practice coughing to be sure your lungs are clear. If you have an incision (the cut made at the time of surgery), support your incision when coughing by placing a pillow or rolled-up towels firmly against it.  Once you are able to  get out of bed, walk around indoors and cough well. You may stop using the incentive spirometer when instructed by your caregiver.   RISKS AND COMPLICATIONS  · Breathing too quickly may cause dizziness. At an extreme, this could cause you to pass out. Take your time so you do not get dizzy or light-headed.  · If you are in pain, you may need to take or ask for pain medication before doing incentive spirometry. It is harder to take a deep breath if you are having pain.  AFTER USE  · Rest and breathe slowly and easily.  · It can be helpful to keep a log of your progress. Your caregiver can provide you with a simple table to help with this.  If you are using the spirometer at home, follow these instructions:  SEEK MEDICAL CARE IF:   · You are having difficultly using the spirometer.  · You have trouble using the spirometer as often as instructed.  · Your pain medication is not giving enough relief while using the spirometer.  · You develop fever of 100.5°F (38.1°C) or higher.  SEEK IMMEDIATE MEDICAL CARE IF:   · You cough up bloody sputum that had not been present before.  · You develop fever of 102°F (38.9°C) or greater.  ·   get worse. Document Released: 07/23/2006 Document Revised: 07/27/2013 Document Reviewed: 09/23/2006 [facility] Patient Information 2015 [place], [place]. This information is not intended to replace advice given to you by your health care provider. Make sure you discuss any questions you have with your health care provider. Rib Fracture A rib fracture is a break or crack in one of the bones of the ribs. The ribs are a group of long, curved bones that wrap around your chest and attach to your spine. They protect your lungs and other organs in the chest cavity. A broken or cracked rib is often painful, but  most do not cause other problems. Most rib fractures heal on their own over time. However, rib fractures can be more serious if multiple ribs are broken or if broken ribs move out of place and push against other structures. CAUSES   A direct blow to the chest. For example, this could happen during contact sports, a car accident, or a fall against a hard object.  Repetitive movements with high force, such as pitching a baseball or having severe coughing spells. SYMPTOMS   Pain when you breathe in or cough.  Pain when someone presses on the injured area. DIAGNOSIS  Your caregiver will perform a physical exam. Various imaging tests may be ordered to confirm the diagnosis and to look for related injuries. These tests may include a chest X-ray, computed tomography (CT), magnetic resonance imaging (MRI), or a bone scan. TREATMENT  Rib fractures usually heal on their own in 1-3 months. The longer healing period is often associated with a continued cough or other aggravating activities. During the healing period, pain control is very important. Medication is usually given to control pain. Hospitalization or surgery may be needed for more severe injuries, such as those in which multiple ribs are broken or the ribs have moved out of place.  HOME CARE INSTRUCTIONS  11. Avoid strenuous activity and any activities or movements that cause pain. Be careful during activities and avoid bumping the injured rib. 12. Gradually increase activity as directed by your caregiver. 13. Only take over-the-counter or prescription medications as directed by your caregiver. Do not take other medications without asking your caregiver first. 14. Apply ice to the injured area for the first 1-2 days after you have been treated or as directed by your caregiver. Applying ice helps to reduce inflammation and pain. 1. Put ice in a plastic bag. 2. Place a towel between your skin and the bag.  3. Leave the ice on for 15-20 minutes at  a time, every 2 hours while you are awake. 15. Perform deep breathing as directed by your caregiver. This will help prevent pneumonia, which is a common complication of a broken rib. Your caregiver may instruct you to: 1. Take deep breaths several times a day. 2. Try to cough several times a day, holding a pillow against the injured area. 3. Use a device called an incentive spirometer to practice deep breathing several times a day. 16. Drink enough fluids to keep your urine clear or pale yellow. This will help you avoid constipation.  17. Do not wear a rib belt or binder. These restrict breathing, which can lead to pneumonia.  SEEK IMMEDIATE MEDICAL CARE IF:   You have a fever.   You have difficulty breathing or shortness of breath.   You develop a continual cough, or you cough up thick or bloody sputum.  You feel sick to your stomach (nausea), throw up (vomit), or have abdominal  pain.   You have worsening pain not controlled with medications.  MAKE SURE YOU:  Understand these instructions.  Will watch your condition.  Will get help right away if you are not doing well or get worse. Document Released: 03/12/2005 Document Revised: 11/12/2012 Document Reviewed: 05/14/2012 [facility] Patient Information 2015 [place], [place]. This information is not intended to replace advice given to you by your health care provider. Make sure you discuss any questions you have with your health care provider.

## 2014-09-28 NOTE — ED Provider Notes (Signed)
CSN: [id number]     Arrival date & time 09/28/14  0741 History   First MD Initiated Contact with Patient 09/28/14 0751     No chief complaint on file.    (Consider location/radiation/quality/duration/timing/severity/associated sxs/prior Treatment) HPI Comments: Patient here complaining of right-sided rib pain after falling several days ago. Seen at urgent care center and according to him and x-rays which did not show any fracture. He was prescribed Percocet and NSAIDs. Has had some relief with Percocet. Denies any abdominal pain. No shortness of breath. Pain is characterized as sharp and worse with movement better with remaining still. Denies any other injury  The history is provided by the patient.    Past Medical History  Diagnosis Date  . Allergy   . Hypertension   . GERD (gastroesophageal reflux disease)   . Ulcer   . Hepatitis C   . COPD (chronic obstructive pulmonary disease)   . PNA (pneumonia)    Past Surgical History  Procedure Laterality Date  . Tonsillectomy    . Back surgery    . Shoulder surgery     Family History  Problem Relation Age of Onset  . Cancer Mother     breast  . Heart disease Father    History  Substance Use Topics  . Smoking status: Current Every Day Smoker -- 0.50 packs/day    Types: Cigarettes  . Smokeless tobacco: Never Used  . Alcohol Use: Yes    Review of Systems  All other systems reviewed and are negative.     Allergies  Bee venom and Poison ivy extract  Home Medications   Prior to Admission medications   Medication Sig Start Date End Date Taking? Authorizing Provider  acetaminophen (TYLENOL) 650 MG CR tablet Take 650 mg by mouth every 8 (eight) hours as needed for pain.   Yes Historical Provider, MD  albuterol (PROVENTIL HFA;VENTOLIN HFA) 108 (90 BASE) MCG/ACT inhaler Inhale 2 puffs into the lungs every 6 (six) hours as needed for wheezing or shortness of breath.   Yes Historical Provider, MD  nitroGLYCERIN (NITROSTAT) 0.4 MG  SL tablet Place 1 tablet (0.4 mg total) under the tongue every 5 (five) minutes as needed for chest pain (do not take more than 3 tablets in a row). 07/11/12  Yes Theodis Blaze, MD  amLODipine (NORVASC) 10 MG tablet Take 10 mg by mouth daily.    Historical Provider, MD  Azelastine-Fluticasone (DYMISTA) 137-50 MCG/ACT SUSP Place 1 spray into the nose daily.    Historical Provider, MD  budesonide-formoterol (SYMBICORT) 160-4.5 MCG/ACT inhaler Inhale 2 puffs into the lungs 2 (two) times daily.    Historical Provider, MD  levofloxacin (LEVAQUIN) 750 MG tablet Take 750 mg by mouth daily.    Historical Provider, MD  lisinopril (PRINIVIL,ZESTRIL) 20 MG tablet Take 20 mg by mouth daily.    Historical Provider, MD  Magnesium 100 MG TABS Take 50 mg by mouth daily.    Historical Provider, MD  POTASSIUM PO Take 1 tablet by mouth daily.    Historical Provider, MD  predniSONE (DELTASONE) 10 MG tablet Take 2 tablets (20 mg total) by mouth daily. 03/16/13   Merryl Hacker, MD   BP 157/69 mmHg  Pulse 83  Temp(Src) 98 F (36.7 C) (Oral)  Resp 16  SpO2 99% Physical Exam  Constitutional: He is oriented to person, place, and time. He appears well-developed and well-nourished.  Non-toxic appearance. No distress.  HENT:  Head: Normocephalic and atraumatic.  Eyes: Conjunctivae, EOM and  lids are normal. Pupils are equal, round, and reactive to light.  Neck: Normal range of motion. Neck supple. No tracheal deviation present. No thyroid mass present.  Cardiovascular: Normal rate, regular rhythm and normal heart sounds.  Exam reveals no gallop.   No murmur heard. Pulmonary/Chest: Effort normal and breath sounds normal. No stridor. No respiratory distress. He has no decreased breath sounds. He has no wheezes. He has no rhonchi. He has no rales. He exhibits tenderness and bony tenderness.    Abdominal: Soft. Normal appearance and bowel sounds are normal. He exhibits no distension. There is no tenderness. There is no  rebound and no CVA tenderness.  Musculoskeletal: Normal range of motion. He exhibits no edema or tenderness.       Arms: Neurological: He is alert and oriented to person, place, and time. He has normal strength. No cranial nerve deficit or sensory deficit. GCS eye subscore is 4. GCS verbal subscore is 5. GCS motor subscore is 6.  Skin: Skin is warm and dry. No abrasion and no rash noted.  Psychiatric: He has a normal mood and affect. His speech is normal and behavior is normal.  Nursing note and vitals reviewed.   ED Course  Procedures (including critical care time) Labs Review Labs Reviewed - No data to display  Imaging Review No results found.   EKG Interpretation None      MDM   Final diagnoses:  None    Pt to be treated for rib fractures    Lacretia Leigh, MD 09/28/14 [phone]

## 2014-10-18 ENCOUNTER — Telehealth: Payer: Self-pay | Admitting: *Deleted

## 2014-10-18 NOTE — Telephone Encounter (Addendum)
  Oncology Nurse Navigator Documentation Referral date to RadOnc/MedOnc: 10/18/14 (10/18/14 1529) Navigator Encounter Type: Introductory phone call (10/18/14 1529)      Placed introductory call to new referral patient. 1. Introduced myself as the oncology nurse navigator that works with Dr. Isidore Moos to whom he has been referred by Dr. Silvestre Moment and with whom he has an appt 10/27/14 1230/1300. 2. He was aware of referral being made, was unaware of appt. 3. I briefly explained my role as a navigator. 4. 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  He stated:  bx of tongue conducted 09/30/14  PET scheduled in [facility] 10/19/14.  Next appt with Dr. Edison Nasuti 10/21/14 11:30.  Plan is for him to receive weekly chemotherapy in [place].  He provide me the phone # for Nurse Navigator, [place] [phone]).  I will contact her for additional information re his tmt. 5. I provided my contact information, encouraged him to call with questions/concerns before next week. 6. He verbalized understanding of information provided, expressed appreciation for my call.   Gayleen Orem, RN, BSN, [place] at [place] [phone]

## 2014-10-20 ENCOUNTER — Telehealth: Payer: Self-pay | Admitting: *Deleted

## 2014-10-20 NOTE — Telephone Encounter (Signed)
  Oncology Nurse Navigator Documentation   Navigator Encounter Type: Telephone (10/20/14 1125)       Received call from Maryagnes Amos, Oncology Nurse Navigator, Center, in follow-up to my conversation with Arva Chafe, [place] earlier this morning.  She will serve as contact for [facility]. She confirmed that patient will be required to receive chemo tmts at [facility] b/c he is ambulatory, no disabilities or co-morbidities that would interfere with his ability to travel to [place] for [place].  We discussed the disadvantages of providing tmts at 2 locations, patient's challenges as tmts progress.  She recognized that this is not an ideal situation but is currently the [place] requirement. She provided her phone # [phone]) and fax [phone]) for future communication.   Gayleen Orem, RN, BSN, [place] at [place] [phone]

## 2014-10-20 NOTE — Progress Notes (Addendum)
Head and Neck Cancer Location of Tumor / Histology: Right Tongue Mass- Invasive Squamous Cell Carcinoma  Patient presented  months ago with symptoms of: April 2016- reports throat was feeling sore, left lymph node was "swelled up." Continued having discomfort bilaterally on throat in June 2016- testing began.  Biopsies of (if applicable) revealed: 78/67/54: pt has a CD with him with information  Nutrition Status Yes No Comments  Weight changes? [x]  []  Reports 15lbs weight loss  Swallowing concerns? [x]  [x]    PEG? [x]  []  Pt denies problems-wouldn't let me look at it.  Pt reports tenderness to site   Referrals Yes No Comments  Social Work? [x]  []    Dentistry? [x]  []  [facility] Negus [facility]- [facility]14- fitted for fluoride trays    Swallowing therapy? []  [x]    Nutrition? [x]  []  Has Ensure and "something else" but isn't using it.  Flushes tube daily.   Med/Onc? [x]  []  [facility]   Safety Issues Yes No Comments  Prior radiation? []  [x]    Pacemaker/ICD? []  [x]    Possible current pregnancy? []  [x]    Is the patient on methotrexate? []  [x]     Tobacco/Marijuana/Snuff/ETOH use: daily smoking  Past/Anticipated interventions by otolaryngology, if any: Tomorrow- 10/28/14  Past/Anticipated interventions by medical oncology, if any: weekly chemotherapy in [place]-haven't started.  Current Complaints / other details:  Hx HTN, active smoker, Veteran.  Has a trach and peg tube.   BP 132/63 mmHg  Pulse 80  Temp(Src) 99.5 F (37.5 C) (Oral)  Resp 12  Wt 170 lb 9.6 oz (77.384 kg)  SpO2 98%  Orthostatic VS standing: BP: 136/73 P: 90 Pox: 97% Room air

## 2014-10-21 ENCOUNTER — Encounter: Payer: Self-pay | Admitting: *Deleted

## 2014-10-21 NOTE — Progress Notes (Signed)
  Oncology Nurse Navigator Documentation   Navigator Encounter Type: Other (10/21/14 1000)      Delivered to [facility] Radiology disks received from patient yesterday of recent CT and PET conducted by [facility] for import into [place] for next week's H&N conference  Gayleen Orem, RN, BSN, [place] at [place] [phone]

## 2014-10-22 ENCOUNTER — Other Ambulatory Visit: Payer: Self-pay | Admitting: Radiation Oncology

## 2014-10-22 ENCOUNTER — Inpatient Hospital Stay
Admission: RE | Admit: 2014-10-22 | Discharge: 2014-10-22 | Disposition: A | Discharge status: Home / Self Care | Payer: Self-pay | Source: Ambulatory Visit | Attending: Radiation Oncology | Admitting: Radiation Oncology

## 2014-10-22 DIAGNOSIS — J449 Chronic obstructive pulmonary disease, unspecified: Secondary | ICD-10-CM

## 2014-10-27 ENCOUNTER — Ambulatory Visit: Payer: Non-veteran care | Admitting: Hematology and Oncology

## 2014-10-27 ENCOUNTER — Encounter: Payer: Self-pay | Admitting: Radiation Oncology

## 2014-10-27 ENCOUNTER — Telehealth: Payer: Self-pay | Admitting: Medical Oncology

## 2014-10-27 ENCOUNTER — Ambulatory Visit
Admission: RE | Admit: 2014-10-27 | Discharge: 2014-10-27 | Disposition: A | Discharge status: Home / Self Care | Payer: No Typology Code available for payment source | Source: Ambulatory Visit | Attending: Radiation Oncology | Admitting: Radiation Oncology

## 2014-10-27 VITALS — BP 136/73 | HR 90 | Temp 99.5°F | Resp 12 | Wt 170.6 lb

## 2014-10-27 DIAGNOSIS — K219 Gastro-esophageal reflux disease without esophagitis: Secondary | ICD-10-CM | POA: Diagnosis not present

## 2014-10-27 DIAGNOSIS — B1920 Unspecified viral hepatitis C without hepatic coma: Secondary | ICD-10-CM | POA: Diagnosis not present

## 2014-10-27 DIAGNOSIS — J449 Chronic obstructive pulmonary disease, unspecified: Secondary | ICD-10-CM | POA: Diagnosis not present

## 2014-10-27 DIAGNOSIS — C029 Malignant neoplasm of tongue, unspecified: Secondary | ICD-10-CM

## 2014-10-27 DIAGNOSIS — Z510 Encounter for antineoplastic radiation therapy: Secondary | ICD-10-CM | POA: Insufficient documentation

## 2014-10-27 DIAGNOSIS — R634 Abnormal weight loss: Secondary | ICD-10-CM

## 2014-10-27 DIAGNOSIS — N189 Chronic kidney disease, unspecified: Secondary | ICD-10-CM | POA: Insufficient documentation

## 2014-10-27 DIAGNOSIS — I10 Essential (primary) hypertension: Secondary | ICD-10-CM | POA: Insufficient documentation

## 2014-10-27 DIAGNOSIS — C01 Malignant neoplasm of base of tongue: Secondary | ICD-10-CM | POA: Insufficient documentation

## 2014-10-27 NOTE — Progress Notes (Signed)
Radiation Oncology         [phone]) [phone] ________________________________  Initial outpatient Consultation  Name: George Nielsen MRN: [id number]  Date: 10/27/2014  DOB: 1955-12-21  PP:IRJJ,OACZYSA ALLEN, MD  George Heinrich, MD   REFERRING PHYSICIAN: Berneice Heinrich, MD  DIAGNOSIS:    ICD-9-CM ICD-10-CM   1. Malignant neoplasm of base of tongue 141.0 C01     [id number] Stage IVA Base of Tongue Squamous Cell Carcinoma  HISTORY OF PRESENT ILLNESS::George Nielsen is a 59 y.o. male who presented with a sore throat in April 2016. He also noticed bilateral neck swelling. His symptoms did not improve for over a month. He then went to his PCP. The symptoms persisted and then he started experiencing right ear pain. He was then directed to the [place] in [place] where diagnostic scans were performed. Reports he has seen a [facility] dentist. States he was not given scatter guards, but was given fluoride trays. He was advised to have his teeth removed, but he has declined to have that done. Reports getting one of his cavities filled. The pt address that he will be receiving chemotherapy at the [facility].  CT [facility] neck at the [facility] was performed on September 14, 2014. This revealed irregular mucosal thickening of the base of tongue consistent with neoplasm and bilateral cervical lymph nodes consistent with metastatic disease. Biopsy of the right base of tongue by Dr. Edison Nielsen on 09/30/14 revealed invasive squamous cell carcinoma. Of note, p16 testing was focally positive. The carcinoma was moderately to poorly differentiated. On laryngoscopy, she appreciated a large right sided tongue base mass which extended down into the vallecula and over onto the left tongue base. Staged as T3.  PET CT imaging was performed at the [place], but I do not have the report nor do I have the fused PET CT images. To my eye, I don't see obvious distant mets.  George Nielsen was placed due to tumoral swelling at biopsy. PEG has been  placed.  PREVIOUS RADIATION THERAPY: No  PAST MEDICAL HISTORY:  has a past medical history of Allergy; Hypertension; GERD (gastroesophageal reflux disease); Ulcer; Hepatitis C; COPD (chronic obstructive pulmonary disease); and PNA (pneumonia).    PAST SURGICAL HISTORY: Past Surgical History  Procedure Laterality Date  . Tonsillectomy    . Back surgery    . Shoulder surgery      FAMILY HISTORY: family history includes Cancer in his mother; Heart disease in his father.  SOCIAL HISTORY:  reports that he has been smoking Cigarettes.  He has been smoking about 0.50 packs per day. He has never used smokeless tobacco. He reports that he does not drink alcohol or use illicit drugs.  ALLERGIES: Bee venom; Vicodin; and Poison ivy extract  MEDICATIONS:  Current Outpatient Prescriptions  Medication Sig Dispense Refill  . HYDROcodone-acetaminophen (HYCET) 7.5-325 mg/15 ml solution Take 15 mLs by mouth every 4 (four) hours as needed for moderate pain.    George Nielsen Kitchen albuterol (PROVENTIL HFA;VENTOLIN HFA) 108 (90 BASE) MCG/ACT inhaler Inhale 2 puffs into the lungs every 6 (six) hours as needed for wheezing or shortness of breath.    George Nielsen Kitchen amLODipine (NORVASC) 10 MG tablet Take 10 mg by mouth daily.    George Nielsen Kitchen buPROPion (WELLBUTRIN XL) 150 MG 24 hr tablet Take 150 mg by mouth 2 (two) times daily.    George Nielsen Kitchen lisinopril (PRINIVIL,ZESTRIL) 20 MG tablet Take 20 mg by mouth daily.    . Magnesium 100 MG TABS Take 50 mg by mouth  daily.    . nitroGLYCERIN (NITROSTAT) 0.4 MG SL tablet Place 1 tablet (0.4 mg total) under the tongue every 5 (five) minutes as needed for chest pain (do not take more than 3 tablets in a row). 30 tablet 0  . POTASSIUM PO Take 1 tablet by mouth daily.     No current facility-administered medications for this encounter.    REVIEW OF SYSTEMS:  Notable for that above. Reports 15 lb weight loss over 6 month period. Denies hemoptysis. Only reports discomfort with the PEG tube. Reports he is not supplementing  with the PEG tube at this time. He is able to eat and drink. Reports smoking 2 packs a day for 40 years, but has cut back over the years. However, he still smokes. Denies drinking alcohol.   PHYSICAL EXAM:  weight is 170 lb 9.6 oz (77.384 kg). His oral temperature is 99.5 F (37.5 C). His blood pressure is 136/73 and his pulse is 90. His respiration is 12 and oxygen saturation is 97%.   General: Alert and oriented, in no acute distress HEENT: Head is normocephalic. Extraocular movements are intact. Oropharynx is notable for no obvious masses in the proximal oropharynx. Mucous membranes are moist. Metal cap on one of his left mandibular molars. Neck: Neck is notable for palpable bilateral lymph node masses in the level 2 regions. Right neck mass extends to the level 3. He has a tracheostomy site that is clean and intact with a trach collar in place. Heart: Regular in rate and rhythm with no murmurs, rubs, or gallops. Chest: Clear to auscultation bilaterally, with no rhonchi, wheezes, or rales. Abdomen:  Pt declines examination of the abdomen./ PEG tube. Extremities: No cyanosis or edema. Lymphatics: see Neck Exam Skin: No concerning lesions. Musculoskeletal: symmetric strength and muscle tone throughout. Neurologic: Cranial nerves II through XII are grossly intact. No obvious focalities. Speech is fluent. Coordination is intact. Psychiatric: Judgment and insight are intact. Affect is appropriate.   ECOG = 1  0 - Asymptomatic (Fully active, able to carry on all predisease activities without restriction)  1 - Symptomatic but completely ambulatory (Restricted in physically strenuous activity but ambulatory and able to carry out work of a light or sedentary nature. For example, light housework, office work)  2 - Symptomatic, <50% in bed during the day (Ambulatory and capable of all self care but unable to carry out any work activities. Up and about more than 50% of waking hours)  3 -  Symptomatic, >50% in bed, but not bedbound (Capable of only limited self-care, confined to bed or chair 50% or more of waking hours)  4 - Bedbound (Completely disabled. Cannot carry on any self-care. Totally confined to bed or chair)  5 - Death   Eustace Pen MM, Creech RH, Tormey DC, et al. [phone]). "Toxicity and response criteria of the [facility]". [place] Oncol. 5 (6): 649-55   LABORATORY DATA:  Lab Results  Component Value Date   WBC 5.6 03/16/2013   HGB 14.9 03/16/2013   HCT 41.9 03/16/2013   MCV 92.3 03/16/2013   PLT 269 03/16/2013   CMP     Component Value Date/Time   NA 134* 03/16/2013 1116   K 4.0 03/16/2013 1116   CL 97 03/16/2013 1116   CO2 26 03/16/2013 1116   GLUCOSE 98 03/16/2013 1116   BUN 12 03/16/2013 1116   CREATININE 0.79 03/16/2013 1116   CALCIUM 8.9 03/16/2013 1116   PROT 6.4 07/11/2012 0303  ALBUMIN 3.3* 07/11/2012 0303   AST 32 07/11/2012 0303   ALT 30 07/11/2012 0303   ALKPHOS 54 07/11/2012 0303   BILITOT 0.5 07/11/2012 0303   GFRNONAA >90 03/16/2013 1116   GFRAA >90 03/16/2013 1116         RADIOGRAPHY: Dg Ribs Unilateral W/chest Right  09/28/2014   CLINICAL DATA:  Pain following fall 4 days prior  EXAM: RIGHT RIBS AND CHEST - 3+ VIEW  COMPARISON:  Chest radiograph March 16, 2013  FINDINGS: Frontal chest as well as oblique and cone-down lower rib images obtained. Lungs remain mildly hyperexpanded. There is no edema or consolidation. Heart size and pulmonary vascularity are within normal limits. No adenopathy.  There are fractures of the anterior right ninth and tenth ribs, minimally displaced. There is no demonstrable effusion or pneumothorax. There is postoperative change in the lumbar spine peer  IMPRESSION: Minimally displaced fractures of the anterior right ninth and tenth ribs. No apparent pneumothorax or effusion. No edema or consolidation. Lungs remain mildly hyperexpanded.   Electronically Signed   By: Lowella Grip  III M.D.   On: 09/28/2014 08:29   Nm Outside Films Spine  10/22/2014   CLINICAL DATA:    This exam is stored here for comparison purposes only and was performed at  an outside facility.   Please contact the originating institution for any  associated interpretation or report.       IMPRESSION/PLAN:  This is a delightful patient with head and neck cancer. I do recommend radiotherapy for this patient.  We discussed the potential risks, benefits, and side effects of radiotherapy. We talked in detail about acute and late effects. We discussed that some of the most bothersome acute effects may be mucositis, dysgeusia, salivary changes, skin irritation, hair loss, dehydration, weight loss and fatigue. We talked about late effects which include but are not necessarily limited to dysphagia, hypothyroidism, nerve injury, spinal cord injury, xerostomia, trismus, and neck edema. No guarantees of treatment were given. A consent form was signed and placed in the patient's medical record. The patient is enthusiastic about proceeding with treatment. I look forward to participating in the patient's care.    Simulation (treatment planning) will take place in the next available appointment.  We also discussed that the treatment of head and neck cancer is a multidisciplinary process to maximize treatment outcomes and quality of life. For this reasons the following referrals have been or will be made:  Pending [facility] appt w./ Medical oncology to discuss chemotherapy   s/p Dentistry for dental evaluation, possible extractions in the radiation fields, and /or advice on reducing risk of cavities, osteoradionecrosis, or other oral issues. Declined extractions.  I talked about the importance of good oral hygiene and risks of dental caries post RT  Refer to Nutritionist for nutrition support during and after treatment.  Refer to Speech language pathology for swallowing and/or speech therapy.  Refer to Social work for  social support.   Ordering Baseline labs including TSH.   The patient continues to use tobacco. The patient was counseled to stop using tobacco and was offered pharmacotherapy and further counseling to help with this. The patient will contemplate pharmacotherapy and further counseling.  I gave the pt my card so that he could tell his med/onc (Dr. Manuella Ghazi) at the [facility] to contact me to discuss who would fill the pt's medications and labs during treatment. The pt gave me Dr. Eugenia Pancoast contact information and I will fax this note to her. The  pt would like his radiotherapy treatment scheduled in the mornings.  This document serves as a record of services personally performed by Eppie Gibson, MD. It was created on her behalf by Darcus Austin, a trained medical scribe. The creation of this record is based on the scribe's personal observations and the provider's statements to them. This document has been checked and approved by the attending provider.     __________________________________________   Eppie Gibson, MD

## 2014-10-29 ENCOUNTER — Other Ambulatory Visit: Payer: Self-pay | Admitting: Radiation Oncology

## 2014-10-29 ENCOUNTER — Telehealth: Payer: Self-pay | Admitting: *Deleted

## 2014-10-29 ENCOUNTER — Encounter: Payer: Self-pay | Admitting: Radiation Oncology

## 2014-10-29 DIAGNOSIS — C01 Malignant neoplasm of base of tongue: Secondary | ICD-10-CM

## 2014-10-29 NOTE — Progress Notes (Signed)
Talked with Dr. Manuella Ghazi - he plans to give CDPP concurrently.  Start date of 8-17 for ChRT discussed. Parotid nodule has been biopsied (SUV uptake on PET), and results pending. -----------------------------------  Eppie Gibson, MD

## 2014-10-29 NOTE — Telephone Encounter (Signed)
CALLED PATIENT TO INFORM OF APPTS., LVM FOR A RETURN CALL 

## 2014-10-29 NOTE — Telephone Encounter (Signed)
CALLED PATIENT TO INFORM OF APPTS. FOR 11-01-14 AND NUTRITION APPT. ON 11-03-14 AND HIS NEURO -REHAB APPT. ON 11-08-14, SPOKE WITH PATIENT AND HE IS AWARE OF THESE APPTS.

## 2014-11-01 ENCOUNTER — Telehealth: Payer: Self-pay | Admitting: *Deleted

## 2014-11-01 ENCOUNTER — Ambulatory Visit
Admission: RE | Admit: 2014-11-01 | Discharge: 2014-11-01 | Disposition: A | Discharge status: Home / Self Care | Payer: No Typology Code available for payment source | Source: Ambulatory Visit | Attending: Radiation Oncology | Admitting: Radiation Oncology

## 2014-11-01 ENCOUNTER — Ambulatory Visit
Admission: RE | Admit: 2014-11-01 | Discharge: 2014-11-01 | Disposition: A | Discharge status: Home / Self Care | Payer: 59 | Source: Ambulatory Visit | Attending: Radiation Oncology | Admitting: Radiation Oncology

## 2014-11-01 ENCOUNTER — Encounter: Payer: Self-pay | Admitting: *Deleted

## 2014-11-01 VITALS — BP 127/64 | HR 81 | Temp 98.8°F | Resp 12

## 2014-11-01 DIAGNOSIS — C01 Malignant neoplasm of base of tongue: Secondary | ICD-10-CM

## 2014-11-01 DIAGNOSIS — R634 Abnormal weight loss: Secondary | ICD-10-CM

## 2014-11-01 DIAGNOSIS — Z510 Encounter for antineoplastic radiation therapy: Secondary | ICD-10-CM | POA: Diagnosis not present

## 2014-11-01 LAB — CBC WITH DIFFERENTIAL/PLATELET
BASO%: 0.7 % (ref 0.0–2.0)
BASOS ABS: 0.1 10*3/uL (ref 0.0–0.1)
EOS%: 2.3 % (ref 0.0–7.0)
Eosinophils Absolute: 0.2 10*3/uL (ref 0.0–0.5)
HCT: 41.9 % (ref 38.4–49.9)
HGB: 14.5 g/dL (ref 13.0–17.1)
LYMPH%: 14.5 % (ref 14.0–49.0)
MCH: 32 pg (ref 27.2–33.4)
MCHC: 34.5 g/dL (ref 32.0–36.0)
MCV: 92.6 fL (ref 79.3–98.0)
MONO#: 0.8 10*3/uL (ref 0.1–0.9)
MONO%: 9 % (ref 0.0–14.0)
NEUT%: 73.5 % (ref 39.0–75.0)
NEUTROS ABS: 6.3 10*3/uL (ref 1.5–6.5)
PLATELETS: 224 10*3/uL (ref 140–400)
RBC: 4.53 10*6/uL (ref 4.20–5.82)
RDW: 12.2 % (ref 11.0–14.6)
WBC: 8.5 10*3/uL (ref 4.0–10.3)
lymph#: 1.2 10*3/uL (ref 0.9–3.3)

## 2014-11-01 LAB — COMPREHENSIVE METABOLIC PANEL (CC13)
ALBUMIN: 3.5 g/dL (ref 3.5–5.0)
ALT: 33 U/L (ref 0–55)
AST: 35 U/L — ABNORMAL HIGH (ref 5–34)
Alkaline Phosphatase: 65 U/L (ref 40–150)
Anion Gap: 7 mEq/L (ref 3–11)
BUN: 6.4 mg/dL — ABNORMAL LOW (ref 7.0–26.0)
CALCIUM: 9.1 mg/dL (ref 8.4–10.4)
CHLORIDE: 103 meq/L (ref 98–109)
CO2: 26 mEq/L (ref 22–29)
CREATININE: 0.8 mg/dL (ref 0.7–1.3)
EGFR: >90 mL/min/{1.73_m2} (ref >90)
GLUCOSE: 107 mg/dL (ref 70–140)
POTASSIUM: 4.1 meq/L (ref 3.5–5.1)
Sodium: 136 mEq/L (ref 136–145)
TOTAL PROTEIN: 6.9 g/dL (ref 6.4–8.3)
Total Bilirubin: 0.53 mg/dL (ref 0.20–1.20)

## 2014-11-01 LAB — TSH CHCC: TSH: 0.705 m(IU)/L (ref 0.320–4.118)

## 2014-11-01 MED ORDER — SODIUM CHLORIDE 0.9 % IJ SOLN
10.0000 mL | INTRAMUSCULAR | Status: DC | PRN
  Administered 2014-11-01: 10 mL via INTRAVENOUS

## 2014-11-01 NOTE — Addendum Note (Signed)
Encounter addended by: Jenene Slicker, RN on: 11/01/2014  9:54 AM<BR>     Documentation filed: Charges VN

## 2014-11-01 NOTE — Progress Notes (Addendum)
Head and Neck Cancer Simulation, IMRT treatment planning, and Special treatment procedure note   Outpatient  Diagnosis:    ICD-9-CM ICD-10-CM   1. Malignant neoplasm of base of tongue 141.0 C01     The patient was taken to the CT simulator and laid in the supine position on the table. An Aquaplast head and shoulder mask was custom fitted to the patient's anatomy. High-resolution CT axial imaging was obtained of the head and neck with contrast. I verified that the quality of the imaging is good for treatment planning. 1 Medically Necessary Treatment Device was fabricated and supervised by me: Aquaplast mask.   Treatment planning note I plan to treat the patient with IMRT. I plan to treat the patient's base of tongue tumor and bilateral neck nodes. I plan to treat to a total dose of 70 Gray in 35  fractions. Dose calculation was ordered from dosimetry.  IMRT planning Note  IMRT is an important modality to deliver adequate dose to the patient's at risk tissues while sparing the patient's normal structures, including the: esophagus, parotid tissue, mandible, brain stem, spinal cord, oral cavity, brachial plexus.  This justifies the use of IMRT in the patient's treatment.   Special Treatment Procedure Note:  The patient will be receiving chemotherapy concurrently. Chemotherapy heightens the risk of side effects. I have considered this during the patient's treatment planning process and will monitor the patient accordingly for side effects on a weekly basis. Concurrent chemotherapy increases the complexity of this patient's treatment and therefore this constitutes a special treatment procedure.  -----------------------------------  Eppie Gibson, MD

## 2014-11-01 NOTE — Telephone Encounter (Signed)
Oncology Nurse Navigator Documentation   Navigator Encounter Type: Telephone (11/01/14 1133)     Called patient to answer his earlier question re: an 11/02/14 2:30 appt he was called about.   I explained this is a cancelled appt with Nutrition that has been rescheduled for this Wed at 9:00.  He verbalized understanding.   Gayleen Orem, RN, BSN, [place] at [place] [phone]

## 2014-11-01 NOTE — Progress Notes (Signed)
Oncology Nurse Navigator Documentation  Oncology Nurse Navigator Flowsheets 11/01/2014  Referral date to RadOnc/MedOnc -  Navigator Encounter Type Clinic/MDC  Patient Visit Type Radonc  Treatment Phase CT SIM  Barriers/Navigation Needs Coordination of Care Education   To provide support and encouragement, care continuity and to assess for needs, met with patient prior to CT SIM.   1. Further introduced myself as his Navigator, explained my role as a member of the Care Team.   2. Provided New Patient Information packet, discussed contents:  Contact information for physician(s), myself, other members of the Care Team.  Advance Directive information ([place] blue pamphlet with LCSW contact info)  Fall Prevention Patient Safety Plan  Appointment Guideline  [facility] campus map with highlight of [place] 3. Provided introductory explanation of radiation treatment, showed him example of mask.   4. Following SIM, provided a tour Tomo area, explained treatment and arrival procedures. 5. Provided EPIC calendar of upcoming appts.  6. He indicated uncertainty re: start of chemo at [facility].  I commented I would contact [place] for clarification and coordination of chemo tmts with RT. 7. I encouraged him to contact me with questions/concerns as treatments/procedures begin.    He verbalized understanding of information provided.     Interventions Education Method  Education Method Verbal;Other  Time Spent with Patient [place], RN, BSN, [place] at [place] [phone]

## 2014-11-01 NOTE — Progress Notes (Addendum)
Pt here for CT simulation.  Vital signs and lab work assessed.  Notified Dr. Isidore Moos of [place]: 6.4 (L)  Dr.  Isidore Moos okay to continue with IV contrast.  Meds and allergies reviewed.  IV started in right ac without complication.  ID band placed.  Pt off to simulation with nurse navigator [place].

## 2014-11-02 ENCOUNTER — Encounter: Payer: 59 | Admitting: Nutrition

## 2014-11-02 ENCOUNTER — Ambulatory Visit: Payer: 59 | Admitting: Nutrition

## 2014-11-02 NOTE — Progress Notes (Signed)
59 year old male diagnosed with tongue cancer.  He is a patient of Dr. Isidore Moos and will receive radiation therapy here at [facility]. Patient reports he will receive chemotherapy at the [facility].  Past medical history includes GERD, hypertension, ulcer, hepatitis C, COPD, and pneumonia.  Medications include Wellbutrin, magnesium and potassium.  Labs were reviewed.  Height: 6 feet 0 inches. Weight: 169.6 pounds Usual body weight: 180 pounds per patient. BMI: 23.00.  Patient reports approximately 15 pound weight loss over the last 6 months. He is status post feeding tube placement. Reports feeding tube has fallen out once already and appears to be in the process of falling out again. He has called the [facility] for assistance. Complains of soreness in his throat and ear. Denies change in eating or difficulty chewing and swallowing at this time. Patient reports that the [facility] has provided him with enteral nutrition for his feeding tube but he does not know what kind it is.  States he will call me with this information.  Nutrition diagnosis:  Predicted suboptimal energy intake related to tongue cancer and associated treatments as evidenced by the history or presence of a condition for which research shows an increased incidence of suboptimal energy intake.  Intervention:  Educated patient to consume frequent small meals and snacks with high-calorie, high-protein foods to minimize further weight loss. Educated patient and provided fact sheet on increasing calories and protein. Reviewed importance of adding oral nutrition supplements.  Provided samples of ensure, boost, and Carnation breakfast essentials. Stressed the importance of flushing feeding tube on a daily basis. Encouraged patient to communicate with me if the [facility] begins tube feeding. Questions were answered.  Teach back method used.  Contact information was given.  Monitoring,  evaluation, goals: Patient will tolerate oral intake plus tube feedings to minimize weight loss throughout treatment.  Next visit: To be scheduled as needed.  **Disclaimer: This note was dictated with voice recognition software. Similar sounding words can inadvertently be transcribed and this note may contain transcription errors which may not have been corrected upon publication of note.**

## 2014-11-03 ENCOUNTER — Encounter: Payer: 59 | Admitting: Nutrition

## 2014-11-03 ENCOUNTER — Telehealth: Payer: Self-pay | Admitting: *Deleted

## 2014-11-03 DIAGNOSIS — Z510 Encounter for antineoplastic radiation therapy: Secondary | ICD-10-CM | POA: Diagnosis not present

## 2014-11-03 NOTE — Telephone Encounter (Signed)
  Oncology Nurse Navigator Documentation   Navigator Encounter Type: Telephone (11/03/14 0934)      LVM for [place] with request for patient's parotid bx conducted last week at [facility].  Provided my fax #, requested return call.  Gayleen Orem, RN, BSN, [place] at [place] [phone]

## 2014-11-04 ENCOUNTER — Telehealth: Payer: Self-pay | Admitting: *Deleted

## 2014-11-04 NOTE — Telephone Encounter (Signed)
  Oncology Nurse Navigator Documentation   Navigator Encounter Type: Telephone (11/04/14 1542)      Received phone call from Maryagnes Amos, [place]; patient in [place] meeting with MedOnc Dr. Celene Squibb to discuss chemo tmts.   We discussed/I confirmed adjustment to 8/18 RT appt to 1420 to accommodate patient's 0730 new start chemo tmt in [place].    Per my conversation with Candace, Tomo RTT, future Thursday RTs will be rescheduled similarly as pt will have future chemos on Thursdays as well.    When appt adjustments are reflected in Epic, I will fax Butch Penny the updated schedule.         Gayleen Orem, RN, BSN, [place] at [place] [phone]

## 2014-11-05 ENCOUNTER — Telehealth: Payer: Self-pay | Admitting: *Deleted

## 2014-11-05 DIAGNOSIS — Z510 Encounter for antineoplastic radiation therapy: Secondary | ICD-10-CM | POA: Diagnosis not present

## 2014-11-05 NOTE — Telephone Encounter (Signed)
  Oncology Nurse Navigator Documentation   Navigator Encounter Type: Telephone (11/05/14 1527)      Had multiple telephone calls with patient re: scheduled/rescheduled RT appts in context of rescheduled chemo appts.  He strongly expressed his concern of changes that led to early afternoon RT appts for the next 2 1/2 weeks, expressed anger and frustration. After conversations with RTT Estill Bamberg, RT Manager Mariann Laster Ramer, a call to another H&N patient, I facilitated adjustment in RT appts for upcoming weeks back to morning, notified patient and [facility] of adjustment.  He expressed appreciation.  Gayleen Orem, RN, BSN, [place] at [place] [phone]

## 2014-11-08 ENCOUNTER — Ambulatory Visit: Payer: 59

## 2014-11-10 ENCOUNTER — Encounter: Payer: Self-pay | Admitting: Radiation Oncology

## 2014-11-10 ENCOUNTER — Encounter: Payer: Self-pay | Admitting: *Deleted

## 2014-11-10 ENCOUNTER — Telehealth: Payer: Self-pay | Admitting: *Deleted

## 2014-11-10 ENCOUNTER — Ambulatory Visit
Admission: RE | Admit: 2014-11-10 | Discharge: 2014-11-10 | Disposition: A | Discharge status: Home / Self Care | Payer: No Typology Code available for payment source | Source: Ambulatory Visit | Attending: Radiation Oncology | Admitting: Radiation Oncology

## 2014-11-10 DIAGNOSIS — C01 Malignant neoplasm of base of tongue: Secondary | ICD-10-CM

## 2014-11-10 DIAGNOSIS — Z510 Encounter for antineoplastic radiation therapy: Secondary | ICD-10-CM | POA: Diagnosis not present

## 2014-11-10 MED ORDER — LORAZEPAM 0.5 MG PO TABS: ORAL_TABLET | ORAL | Status: DC

## 2014-11-10 NOTE — Progress Notes (Signed)
IMRT Device Note Outpatient    ICD-9-CM ICD-10-CM   1. Malignant neoplasm of base of tongue 141.0 C01 LORazepam (ATIVAN) 0.5 MG tablet    9.6 delivered field widths represent one set of IMRT treatment devices. The code is [phone].  -----------------------------------  Eppie Gibson, MD

## 2014-11-10 NOTE — Progress Notes (Signed)
   Weekly Management Note:  Outpatient    Current Dose:  2 Gy  Projected Dose: 70 Gy   Narrative:  The patient presents for routine under treatment assessment.  CBCT/MVCT images/Port film x-rays were reviewed.  The chart was checked. Anxiety while wearing mask - requesting medication  Physical Findings:  Wt Readings from Last 3 Encounters:  11/02/14 169 lb 9.6 oz (76.93 kg)  10/27/14 170 lb 9.6 oz (77.384 kg)  07/11/12 189 lb 13.1 oz (86.1 kg)    vitals were not taken for this visit. NAD, sitting in chair  CBC    Component Value Date/Time   WBC 8.5 11/01/2014 0833   WBC 5.6 03/16/2013 1116   RBC 4.53 11/01/2014 0833   RBC 4.54 03/16/2013 1116   HGB 14.5 11/01/2014 0833   HGB 14.9 03/16/2013 1116   HCT 41.9 11/01/2014 0833   HCT 41.9 03/16/2013 1116   PLT 224 11/01/2014 0833   PLT 269 03/16/2013 1116   MCV 92.6 11/01/2014 0833   MCV 92.3 03/16/2013 1116   MCH 32.0 11/01/2014 0833   MCH 32.8 03/16/2013 1116   MCHC 34.5 11/01/2014 0833   MCHC 35.6 03/16/2013 1116   RDW 12.2 11/01/2014 0833   RDW 13.6 03/16/2013 1116   LYMPHSABS 1.2 11/01/2014 0833   LYMPHSABS 1.5 03/16/2013 1116   MONOABS 0.8 11/01/2014 0833   MONOABS 0.9 03/16/2013 1116   EOSABS 0.2 11/01/2014 0833   EOSABS 0.0 03/16/2013 1116   BASOSABS 0.1 11/01/2014 0833   BASOSABS 0.0 03/16/2013 1116     CMP     Component Value Date/Time   NA 136 11/01/2014 0833   NA 134* 03/16/2013 1116   K 4.1 11/01/2014 0833   K 4.0 03/16/2013 1116   CL 97 03/16/2013 1116   CO2 26 11/01/2014 0833   CO2 26 03/16/2013 1116   GLUCOSE 107 11/01/2014 0833   GLUCOSE 98 03/16/2013 1116   BUN 6.4* 11/01/2014 0833   BUN 12 03/16/2013 1116   CREATININE 0.8 11/01/2014 0833   CREATININE 0.79 03/16/2013 1116   CALCIUM 9.1 11/01/2014 0833   CALCIUM 8.9 03/16/2013 1116   PROT 6.9 11/01/2014 0833   PROT 6.4 07/11/2012 0303   ALBUMIN 3.5 11/01/2014 0833   ALBUMIN 3.3* 07/11/2012 0303   AST 35* 11/01/2014 0833   AST 32  07/11/2012 0303   ALT 33 11/01/2014 0833   ALT 30 07/11/2012 0303   ALKPHOS 65 11/01/2014 0833   ALKPHOS 54 07/11/2012 0303   BILITOT 0.53 11/01/2014 0833   BILITOT 0.5 07/11/2012 0303   GFRNONAA >90 03/16/2013 1116   GFRAA >90 03/16/2013 1116     Impression:  The patient is tolerating radiotherapy w/ anxiety, claustrophobia.   Plan:  Continue radiotherapy as planned. Ativan Rx given. Advised not to drive while on this medication. -----------------------------------  Eppie Gibson, MD

## 2014-11-10 NOTE — Progress Notes (Signed)
  Oncology Nurse Navigator Documentation   Navigator Encounter Type: Clinic/MDC (11/10/14 1305)      To provide support and encouragement, care continuity and to assess for needs, met with patient during [facility] and afterwards.  RTT Candace provided support between CT and tmt.   He completed tmt successfully but expressed high anxiety.  We discussed how subsequent tmts will take less time, that subsequent tmts will be easier.    I escorted him to [place] after tomo, where he met with Dr. Isidore Moos who provided Ativan Rx.  He verbalized understanding of when to take.  Per Dr. Isidore Moos, I arranged for Polo Riley, LCSW, to meet with patient, to further address his anxiety.  She indicated later that she will continue to follow him.  Gayleen Orem, RN, BSN, [place] at [place] [phone]

## 2014-11-10 NOTE — Telephone Encounter (Signed)
  Oncology Nurse Navigator Documentation   Navigator Encounter Type: Telephone (11/10/14 1028)     LVM for patient to remind him of 1:00 RT today, encouraged arrival in Radiation Waiting by 12:45.    Gayleen Orem, RN, BSN, [place] at [place] [phone]

## 2014-11-11 ENCOUNTER — Ambulatory Visit
Admission: RE | Admit: 2014-11-11 | Discharge: 2014-11-11 | Disposition: A | Discharge status: Home / Self Care | Payer: No Typology Code available for payment source | Source: Ambulatory Visit | Attending: Radiation Oncology | Admitting: Radiation Oncology

## 2014-11-11 ENCOUNTER — Telehealth: Payer: Self-pay | Admitting: *Deleted

## 2014-11-11 ENCOUNTER — Encounter: Payer: Self-pay | Admitting: *Deleted

## 2014-11-11 DIAGNOSIS — Z510 Encounter for antineoplastic radiation therapy: Secondary | ICD-10-CM | POA: Diagnosis not present

## 2014-11-11 NOTE — Telephone Encounter (Signed)
  Oncology Nurse Navigator Documentation   Navigator Encounter Type: Telephone (11/11/14 1345)      Pt called to inform he is still in [place] receiving chemo, will probably be 3:30, 3:45 before arrives for RT.  I thanked him for his call, notified Tomo.  Gayleen Orem, RN, BSN, [place] at [place] [phone]

## 2014-11-11 NOTE — Progress Notes (Signed)
Entry error

## 2014-11-11 NOTE — Progress Notes (Signed)
  Oncology Nurse Navigator Documentation   Navigator Encounter Type: Treatment (11/11/14 5615)     To provide support and encouragement, care continuity and to assess for needs, met with patient during scheduled RT.  His fiance accompanied him. He indicated he had taken 0.5 mg Ativan about 15 minutes ago. He tolerated tmt without difficulty, expressed encouragement that the "pill took the edge off". Per patient's request for later appt times on Fridays (chemo day) to allow him additional time to travel from [place], [place] RTT to adjust as possible.  Gayleen Orem, RN, BSN, [place] at [place] [phone]

## 2014-11-12 ENCOUNTER — Ambulatory Visit
Admission: RE | Admit: 2014-11-12 | Discharge: 2014-11-12 | Disposition: A | Discharge status: Home / Self Care | Payer: No Typology Code available for payment source | Source: Ambulatory Visit | Attending: Radiation Oncology | Admitting: Radiation Oncology

## 2014-11-12 ENCOUNTER — Telehealth: Payer: Self-pay | Admitting: *Deleted

## 2014-11-12 DIAGNOSIS — Z510 Encounter for antineoplastic radiation therapy: Secondary | ICD-10-CM | POA: Diagnosis not present

## 2014-11-12 NOTE — Telephone Encounter (Signed)
  Oncology Nurse Navigator Documentation   Navigator Encounter Type: Telephone (11/12/14 1302)      [place], LVM indicating patient needs dental consult for fitting of fluoride trays, requested she return my call.  Gayleen Orem, RN, BSN, [place] at [place] [phone]

## 2014-11-15 ENCOUNTER — Ambulatory Visit
Admission: RE | Admit: 2014-11-15 | Discharge: 2014-11-15 | Disposition: A | Discharge status: Home / Self Care | Payer: 59 | Source: Ambulatory Visit | Attending: Radiation Oncology | Admitting: Radiation Oncology

## 2014-11-15 ENCOUNTER — Ambulatory Visit
Admission: RE | Admit: 2014-11-15 | Discharge: 2014-11-15 | Disposition: A | Discharge status: Home / Self Care | Payer: No Typology Code available for payment source | Source: Ambulatory Visit | Attending: Radiation Oncology | Admitting: Radiation Oncology

## 2014-11-15 ENCOUNTER — Encounter: Payer: Self-pay | Admitting: *Deleted

## 2014-11-15 VITALS — BP 122/67 | HR 68 | Temp 98.6°F | Resp 12 | Wt 169.5 lb

## 2014-11-15 DIAGNOSIS — Z510 Encounter for antineoplastic radiation therapy: Secondary | ICD-10-CM | POA: Diagnosis not present

## 2014-11-15 DIAGNOSIS — C01 Malignant neoplasm of base of tongue: Secondary | ICD-10-CM | POA: Insufficient documentation

## 2014-11-15 MED ORDER — LIDOCAINE VISCOUS 2 % MT SOLN: OROMUCOSAL | Status: DC

## 2014-11-15 MED ORDER — BIAFINE EX EMUL
Freq: Two times a day (BID) | CUTANEOUS | Status: DC
  Administered 2014-11-15: 17:00:00 via TOPICAL

## 2014-11-15 MED ORDER — SUCRALFATE 1 G PO TABS: ORAL_TABLET | ORAL | Status: DC

## 2014-11-15 NOTE — Progress Notes (Signed)
   Weekly Management Note:  Outpatient    Current Dose:  8 Gy  Projected Dose: 70 Gy   Narrative:  The patient presents for routine under treatment assessment.  CBCT/MVCT images/Port film x-rays were reviewed.  The chart was checked. Anxiety while wearing mask - Ativan helps.  Pain with swallowing, only. Had chemotherapy last week. Some fatigue.  Physical Findings:  Wt Readings from Last 3 Encounters:  11/15/14 169 lb 8 oz (76.885 kg)  11/02/14 169 lb 9.6 oz (76.93 kg)  10/27/14 170 lb 9.6 oz (77.384 kg)    weight is 169 lb 8 oz (76.885 kg). His oral temperature is 98.6 F (37 C). His blood pressure is 122/67 and his pulse is 68. His respiration is 12 and oxygen saturation is 100%.  NAD, sitting in chair; skin intact, trach intact.  No mucositis or thrush.  CBC    Component Value Date/Time   WBC 8.5 11/01/2014 0833   WBC 5.6 03/16/2013 1116   RBC 4.53 11/01/2014 0833   RBC 4.54 03/16/2013 1116   HGB 14.5 11/01/2014 0833   HGB 14.9 03/16/2013 1116   HCT 41.9 11/01/2014 0833   HCT 41.9 03/16/2013 1116   PLT 224 11/01/2014 0833   PLT 269 03/16/2013 1116   MCV 92.6 11/01/2014 0833   MCV 92.3 03/16/2013 1116   MCH 32.0 11/01/2014 0833   MCH 32.8 03/16/2013 1116   MCHC 34.5 11/01/2014 0833   MCHC 35.6 03/16/2013 1116   RDW 12.2 11/01/2014 0833   RDW 13.6 03/16/2013 1116   LYMPHSABS 1.2 11/01/2014 0833   LYMPHSABS 1.5 03/16/2013 1116   MONOABS 0.8 11/01/2014 0833   MONOABS 0.9 03/16/2013 1116   EOSABS 0.2 11/01/2014 0833   EOSABS 0.0 03/16/2013 1116   BASOSABS 0.1 11/01/2014 0833   BASOSABS 0.0 03/16/2013 1116     CMP     Component Value Date/Time   NA 136 11/01/2014 0833   NA 134* 03/16/2013 1116   K 4.1 11/01/2014 0833   K 4.0 03/16/2013 1116   CL 97 03/16/2013 1116   CO2 26 11/01/2014 0833   CO2 26 03/16/2013 1116   GLUCOSE 107 11/01/2014 0833   GLUCOSE 98 03/16/2013 1116   BUN 6.4* 11/01/2014 0833   BUN 12 03/16/2013 1116   CREATININE 0.8 11/01/2014 0833     CREATININE 0.79 03/16/2013 1116   CALCIUM 9.1 11/01/2014 0833   CALCIUM 8.9 03/16/2013 1116   PROT 6.9 11/01/2014 0833   PROT 6.4 07/11/2012 0303   ALBUMIN 3.5 11/01/2014 0833   ALBUMIN 3.3* 07/11/2012 0303   AST 35* 11/01/2014 0833   AST 32 07/11/2012 0303   ALT 33 11/01/2014 0833   ALT 30 07/11/2012 0303   ALKPHOS 65 11/01/2014 0833   ALKPHOS 54 07/11/2012 0303   BILITOT 0.53 11/01/2014 0833   BILITOT 0.5 07/11/2012 0303   GFRNONAA >90 03/16/2013 1116   GFRAA >90 03/16/2013 1116     Impression:  The patient is tolerating radiotherapy  Plan:  Continue radiotherapy as planned. Lidocaine and Sucralfate Rx given for esophagitis/mucositis. Will defer to med/onc for narcotic management.  -----------------------------------  Eppie Gibson, MD

## 2014-11-15 NOTE — Progress Notes (Signed)
SKIN CARE DURING RADIATION TREATMENT-HEAD AND NECK  RECOMMENDATIONS: ? Use unscented soap (Dove) ? When showering it is fine for water to touch the area, but please avoid direct spray on the treatment field.  Also, wash inside and around the marked area ? When drying gently blot the area ? Avoid using lotions, oils or powders as well as products with alcohol ? If you shave, use an electric razor and DO NOT use pre or after shave lotion  ? Moisturizer o You may be given Radiaplex Gel or Biafine (provided by nursing) to use. Apply twice daily, once after treatment and then again prior to bedtime o Your Radiation Oncologist may suggest other skin care products ? PLEASE DO NOT APPLY ANYTHING TO THE TREATMENT AREA SKIN WITH 4 HOURS  PRIOR TO RADIATION  ? Mouth care o Soothing relief: rinse your mouth every 1-2 hours with a solution of  teaspoon baking soda and 1/8 teaspoon salt mixed in 1 cup of warm water o DO NOT use mouthwashes that contain alcohol, try using BIOTENE instead  Managing Acute Radiation Side Effects for Head and Neck Cancer  Skin irritation:  . Biafine  Topical Emulsion: First-line topical cream to help soothe skin irritation.  Apply to skin in radiation fields at least 4 hours before radiotherapy, or any time after treatments during the rest of the day.  . Triple Antibiotic Ointment (Neosporin): Apply to areas of skin with moist breakdown to prevent infection.  . 1% hydrocortisone cream: Apply to areas of skin that are itching, up to three times a day.  Arnetha Massy (Silver Sulfadiazine): Used in select cases if large patches of skin develop moist breakdown (let physician or nurse know if you have a "sulfa" drug allergy)  Soreness in mouth or throat: . Baking Soda Rinse: a home remedy to soothe/cleanse mouth and loosen thick saliva.  Mix 1/2 teaspoon salt, 1/2 teaspoon baking soda, 1 pint water (16 oz or two cups).  Swish, gargle and spit as needed to soothe/cleanse  mouth. Use as often as you want.  . Sucralfate (Carafate): coats throat to soothe it before meals or any time of day. Crush 1 tablet in 10 mL H20 and swallow up to four times a day.  . 2% viscous Lidocaine (Magic Mouth Wash): Soothes mouth and/or throat by numbing your mucous membranes. Mix 1 part 2% viscous lidocaine (Magic Mouth Wash), 1 part H20. Swish and/or swallow 28mL of this mixture, 83min before meals and at bedtime, up to four times a day. Alternate with Sucralfate (Carafate).  . Narcotics: Various short acting and long acting narcotics can be prescribed.  Often, medical oncology will prescribe these if you are receiving chemotherapy concurrently. Narcotics may cause constipation. It may be helpful to take a stool softener (Docusate Sodium) or gentle laxative (ie Senna or Polyethylene Glycol) to prevent constipation.  Having food in your stomach before ingesting a narcotic may reduce risk of stomach upset.  Thick Saliva: . Baking Soda Rinse: a home remedy to soothe/cleanse mouth and loosen thick saliva.  Mix 1/2 teaspoon salt, 1/2 teaspoon baking soda, 1 pint water (16 oz or two cups).  Swish, gargle, and spit as needed to soothe/cleanse mouth. Use as often as you want.  . Some patients find Diet Ginger Ale or Papaya Juice to be helpful.  . In extreme cases, your physician may consider prescribing a Scopolamine transdermal patch which dries up your saliva.     Poor taste, or lack of taste:   .  There are no well-established medications to combat taste bud changes from radiotherapy.  It often takes weeks to months to regain taste function.  Eating bland foods and drinking nutritional shakes  may help you maintain your weight when food is not enjoyable.  Some patients supplement their oral intake with a feeding tube.  Fatigue and weakness: . There is not a well-established safe and effective medication to combat radiation-induced fatigue.  However, if you are able to perform light exercise  (such as a daily walk, yoga, recumbent stationary bicycling), this may combat fatigue and help you maintain muscle mass during treatment.  . Maintaining hydration and nutrition are also important.  If you have not been referred to a nutritionist and would like a referral, please let your nurse or physician know.  . Try to get at least 8 hours of sleep each night. You may need a daily nap, but try not to nap so late that it interferes with your nightly sleep schedule.

## 2014-11-15 NOTE — Addendum Note (Signed)
Encounter addended by: Jenene Slicker, RN on: 11/15/2014  5:22 PM<BR>     Documentation filed: Orders, Dx Association, Inpatient [facility]

## 2014-11-15 NOTE — Progress Notes (Signed)
PAIN: He rates his pain as a 7 on a scale of 0-10. intermittent and "sore, back of tongue only when swallowing"   SWALLOWING/DIET: Pt denies dysphagia. No supplements, flushing peg site every day. Peg tube site appearance- pt reports site is non tender without drainage or irritation. Oral exam reveals mucous membranes dry.  Pt reports his trach secretions are clear. Trach site intact.   BOWEL: Pt reports Nausea.  Reports having bouts constipation, says hes taking something to help but doesn't know the name.  He had a bowel movement today. SKIN: Skin exam reveals Dryness and Hyperpigmentation. Pt given Biafine today. OTHER: Pt complains of fatigue and weakness. Reports some difficulty falling asleep.  WEIGHT/VS: Wt Readings from Last 3 Encounters:  11/15/14 169 lb 8 oz (76.885 kg)  11/02/14 169 lb 9.6 oz (76.93 kg)  10/27/14 170 lb 9.6 oz (77.384 kg)   BP 122/67 mmHg  Pulse 68  Temp(Src) 98.6 F (37 C) (Oral)  Resp 12  Wt 169 lb 8 oz (76.885 kg)  SpO2 100% Orthostatic Standing Vital Signs: BP:125/65 P:86 Pox:100%

## 2014-11-16 ENCOUNTER — Ambulatory Visit
Admission: RE | Admit: 2014-11-16 | Discharge: 2014-11-16 | Disposition: A | Discharge status: Home / Self Care | Payer: No Typology Code available for payment source | Source: Ambulatory Visit | Attending: Radiation Oncology | Admitting: Radiation Oncology

## 2014-11-16 DIAGNOSIS — Z510 Encounter for antineoplastic radiation therapy: Secondary | ICD-10-CM | POA: Diagnosis not present

## 2014-11-17 ENCOUNTER — Ambulatory Visit
Admission: RE | Admit: 2014-11-17 | Discharge: 2014-11-17 | Disposition: A | Discharge status: Home / Self Care | Payer: No Typology Code available for payment source | Source: Ambulatory Visit | Attending: Radiation Oncology | Admitting: Radiation Oncology

## 2014-11-17 DIAGNOSIS — Z510 Encounter for antineoplastic radiation therapy: Secondary | ICD-10-CM | POA: Diagnosis not present

## 2014-11-18 ENCOUNTER — Ambulatory Visit
Admission: RE | Admit: 2014-11-18 | Discharge: 2014-11-18 | Disposition: A | Discharge status: Home / Self Care | Payer: No Typology Code available for payment source | Source: Ambulatory Visit | Attending: Radiation Oncology | Admitting: Radiation Oncology

## 2014-11-18 DIAGNOSIS — Z510 Encounter for antineoplastic radiation therapy: Secondary | ICD-10-CM | POA: Diagnosis not present

## 2014-11-19 ENCOUNTER — Telehealth: Payer: Self-pay | Admitting: *Deleted

## 2014-11-19 ENCOUNTER — Ambulatory Visit
Admission: RE | Admit: 2014-11-19 | Discharge: 2014-11-19 | Disposition: A | Discharge status: Home / Self Care | Payer: No Typology Code available for payment source | Source: Ambulatory Visit | Attending: Radiation Oncology | Admitting: Radiation Oncology

## 2014-11-19 DIAGNOSIS — Z510 Encounter for antineoplastic radiation therapy: Secondary | ICD-10-CM | POA: Diagnosis not present

## 2014-11-19 NOTE — Progress Notes (Signed)
  Oncology Nurse Navigator Documentation    Navigator Encounter Type: Other (fax) (11/15/14 1535)         Interventions: Coordination of Care (11/15/14 1535)       Faxed patient schedule to [facility], Maryagnes Amos.  Successful TX notice rec'd.  Gayleen Orem, RN, BSN, [place] at [employer] [phone]         Time Spent with Patient: 15 (11/15/14 1535)

## 2014-11-19 NOTE — Telephone Encounter (Signed)
  Oncology Nurse Navigator Documentation   Navigator Encounter Type: Telephone (11/19/14 1443)         Interventions: Coordination of Care (11/19/14 1443)      Returned patient's VM in which he stated returned from [place] s/p chemo, inquired about coming in early for 4:00 tomo.  I spoke with Tommo RTT Kristi, she stated he could, I notified patient.  Gayleen Orem, RN, BSN, [place] at [place] [phone]         Time Spent with Patient: 15 (11/19/14 1443)

## 2014-11-19 NOTE — Progress Notes (Signed)
Oncology Nurse Navigator Documentation  Oncology Nurse Navigator Flowsheets 11/15/2014  Referral date to RadOnc/MedOnc -  Navigator Encounter Type Clinic/MDC  Patient Visit Type Radonc  Treatment Phase Treatment;Other  To provide support and encouragement, care continuity and to assess for needs, met with patient after daily Tomo, joined him for WUT with Dr. Isidore Moos. He reported:  7/10 pain back of throat, pain with swallowing.  Dr. Isidore Moos provided printed Rx for lidocaine, carafate, to be filled by [facility].  Nausea over weekend r/t last Thursday's chemo.  Flushing PEG daily.  Has reduced smoking from 1/2 pack/day to 4/d.  I recognized this accomplishment, encouraged his goal of complete cessation. I provided him an updated Epic appt schedule, noted I would fax a copy to [employer].  He understands I can be contacted with needs/concerns.  Gayleen Orem, RN, BSN, [place] at [place] [phone]     Barriers/Navigation Needs -  Interventions -  Education Method -  Time Spent with Patient 29

## 2014-11-19 NOTE — Progress Notes (Addendum)
Entry error

## 2014-11-22 ENCOUNTER — Ambulatory Visit: Payer: No Typology Code available for payment source

## 2014-11-22 ENCOUNTER — Encounter: Payer: Self-pay | Admitting: Radiation Oncology

## 2014-11-22 ENCOUNTER — Ambulatory Visit
Admission: RE | Admit: 2014-11-22 | Discharge: 2014-11-22 | Disposition: A | Discharge status: Home / Self Care | Payer: No Typology Code available for payment source | Source: Ambulatory Visit | Attending: Radiation Oncology | Admitting: Radiation Oncology

## 2014-11-22 ENCOUNTER — Ambulatory Visit
Admission: RE | Admit: 2014-11-22 | Discharge: 2014-11-22 | Disposition: A | Discharge status: Home / Self Care | Payer: 59 | Source: Ambulatory Visit | Attending: Radiation Oncology | Admitting: Radiation Oncology

## 2014-11-22 VITALS — BP 130/69 | HR 70 | Resp 16 | Wt 168.0 lb

## 2014-11-22 DIAGNOSIS — Z510 Encounter for antineoplastic radiation therapy: Secondary | ICD-10-CM | POA: Diagnosis not present

## 2014-11-22 DIAGNOSIS — C01 Malignant neoplasm of base of tongue: Secondary | ICD-10-CM

## 2014-11-22 NOTE — Progress Notes (Addendum)
Weight and vitals stable. Reports constant throat pain 10 on a scale of 0-10. Reports this pain is persistent despite using lidocaine and carafate. No skin changes noted within treatment field. Reports using biafine as directed. Reports dry mouth. Provided patient with biotene sample. Reports fatigue. Report difficulty sleeping. Reports his diet consist of soft foods and sandwiches. Trach noted. Trach site without redness, drainage or edema.   BP 130/69 mmHg  Pulse 70  Resp 16  Wt 168 lb (76.204 kg)  SpO2 100% Wt Readings from Last 3 Encounters:  11/22/14 168 lb (76.204 kg)  11/15/14 169 lb 8 oz (76.885 kg)  11/02/14 169 lb 9.6 oz (76.93 kg)

## 2014-11-22 NOTE — Progress Notes (Signed)
   Weekly Management Note:  Outpatient    Current Dose:  18 Gy  Projected Dose: 70 Gy   Narrative:  The patient presents for routine under treatment assessment.  CBCT/MVCT images/Port film x-rays were reviewed.  The chart was checked Weight and vitals stable. Reports constant throat pain 10 on a scale of 0-10. Reports this pain is persistent despite using lidocaine and carafate. No skin changes noted within treatment field. Reports using biafine as directed. Reports dry mouth. Provided patient with biotene sample. Reports fatigue. Report difficulty sleeping. Reports his diet consist of soft foods and sandwiches. Trach noted. Trach site without redness, drainage or edema.   Physical Findings:  Wt Readings from Last 3 Encounters:  11/22/14 168 lb (76.204 kg)  11/15/14 169 lb 8 oz (76.885 kg)  11/02/14 169 lb 9.6 oz (76.93 kg)    weight is 168 lb (76.204 kg). His blood pressure is 130/69 and his pulse is 70. His respiration is 16 and oxygen saturation is 100%.  does not appear in severe pain, sitting in chair; skin intact, trach intact.  No mucositis or thrush.  CBC    Component Value Date/Time   WBC 8.5 11/01/2014 0833   WBC 5.6 03/16/2013 1116   RBC 4.53 11/01/2014 0833   RBC 4.54 03/16/2013 1116   HGB 14.5 11/01/2014 0833   HGB 14.9 03/16/2013 1116   HCT 41.9 11/01/2014 0833   HCT 41.9 03/16/2013 1116   PLT 224 11/01/2014 0833   PLT 269 03/16/2013 1116   MCV 92.6 11/01/2014 0833   MCV 92.3 03/16/2013 1116   MCH 32.0 11/01/2014 0833   MCH 32.8 03/16/2013 1116   MCHC 34.5 11/01/2014 0833   MCHC 35.6 03/16/2013 1116   RDW 12.2 11/01/2014 0833   RDW 13.6 03/16/2013 1116   LYMPHSABS 1.2 11/01/2014 0833   LYMPHSABS 1.5 03/16/2013 1116   MONOABS 0.8 11/01/2014 0833   MONOABS 0.9 03/16/2013 1116   EOSABS 0.2 11/01/2014 0833   EOSABS 0.0 03/16/2013 1116   BASOSABS 0.1 11/01/2014 0833   BASOSABS 0.0 03/16/2013 1116     CMP     Component Value Date/Time   NA 136 11/01/2014  0833   NA 134* 03/16/2013 1116   K 4.1 11/01/2014 0833   K 4.0 03/16/2013 1116   CL 97 03/16/2013 1116   CO2 26 11/01/2014 0833   CO2 26 03/16/2013 1116   GLUCOSE 107 11/01/2014 0833   GLUCOSE 98 03/16/2013 1116   BUN 6.4* 11/01/2014 0833   BUN 12 03/16/2013 1116   CREATININE 0.8 11/01/2014 0833   CREATININE 0.79 03/16/2013 1116   CALCIUM 9.1 11/01/2014 0833   CALCIUM 8.9 03/16/2013 1116   PROT 6.9 11/01/2014 0833   PROT 6.4 07/11/2012 0303   ALBUMIN 3.5 11/01/2014 0833   ALBUMIN 3.3* 07/11/2012 0303   AST 35* 11/01/2014 0833   AST 32 07/11/2012 0303   ALT 33 11/01/2014 0833   ALT 30 07/11/2012 0303   ALKPHOS 65 11/01/2014 0833   ALKPHOS 54 07/11/2012 0303   BILITOT 0.53 11/01/2014 0833   BILITOT 0.5 07/11/2012 0303   GFRNONAA >90 03/16/2013 1116   GFRAA >90 03/16/2013 1116     Impression:  The patient is tolerating radiotherapy with pain  Plan:  Continue radiotherapy as planned. Lidocaine and Sucralfate  for esophagitis/mucositis. Sees med/onc for narcotic management. Patient has oxycodone/acet and uses sparingly; may increase usage per Rx. May discuss longer acting pain meds with med/onc at [facility].  -----------------------------------  Eppie Gibson, MD

## 2014-11-23 ENCOUNTER — Ambulatory Visit
Admission: RE | Admit: 2014-11-23 | Discharge: 2014-11-23 | Disposition: A | Discharge status: Home / Self Care | Payer: No Typology Code available for payment source | Source: Ambulatory Visit | Attending: Radiation Oncology | Admitting: Radiation Oncology

## 2014-11-23 ENCOUNTER — Encounter: Payer: 59 | Admitting: Speech Pathology

## 2014-11-23 ENCOUNTER — Telehealth: Payer: Self-pay | Admitting: *Deleted

## 2014-11-23 DIAGNOSIS — Z510 Encounter for antineoplastic radiation therapy: Secondary | ICD-10-CM | POA: Diagnosis not present

## 2014-11-23 NOTE — Telephone Encounter (Signed)
  Oncology Nurse Navigator Documentation   Navigator Encounter Type: Telephone (11/23/14 1407)         Interventions: Other (symptom mgt) (11/23/14 1407)     Patient called to report severe itching around neck, shoulders, upper chest. He stated he finds relief with Benadryl and Biafine application.    I encourage him to increase Benadryl as prescribed and application of Biafine.    If this doesn't help, I encouraged him to ask to see Dr. Isidore Moos tomorrow when he comes in for RT. He verbalized understanding.  Gayleen Orem, RN, BSN, [place] [facility] [phone]           Time Spent with Patient: 15 (11/23/14 1407)

## 2014-11-24 ENCOUNTER — Ambulatory Visit
Admission: RE | Admit: 2014-11-24 | Discharge: 2014-11-24 | Disposition: A | Discharge status: Home / Self Care | Payer: No Typology Code available for payment source | Source: Ambulatory Visit | Attending: Radiation Oncology | Admitting: Radiation Oncology

## 2014-11-24 DIAGNOSIS — Z510 Encounter for antineoplastic radiation therapy: Secondary | ICD-10-CM | POA: Diagnosis not present

## 2014-11-24 MED ORDER — RADIAPLEXRX EX GEL: Freq: Once | CUTANEOUS | Status: DC

## 2014-11-25 ENCOUNTER — Ambulatory Visit
Admission: RE | Admit: 2014-11-25 | Discharge: 2014-11-25 | Disposition: A | Discharge status: Home / Self Care | Payer: No Typology Code available for payment source | Source: Ambulatory Visit | Attending: Radiation Oncology | Admitting: Radiation Oncology

## 2014-11-25 DIAGNOSIS — Z510 Encounter for antineoplastic radiation therapy: Secondary | ICD-10-CM | POA: Diagnosis not present

## 2014-11-26 ENCOUNTER — Ambulatory Visit
Admission: RE | Admit: 2014-11-26 | Discharge: 2014-11-26 | Disposition: A | Discharge status: Home / Self Care | Payer: No Typology Code available for payment source | Source: Ambulatory Visit | Attending: Radiation Oncology | Admitting: Radiation Oncology

## 2014-11-26 DIAGNOSIS — Z510 Encounter for antineoplastic radiation therapy: Secondary | ICD-10-CM | POA: Diagnosis not present

## 2014-11-30 ENCOUNTER — Ambulatory Visit: Payer: Non-veteran care | Attending: Radiation Oncology | Admitting: Speech Pathology

## 2014-11-30 ENCOUNTER — Ambulatory Visit
Admission: RE | Admit: 2014-11-30 | Discharge: 2014-11-30 | Disposition: A | Discharge status: Home / Self Care | Payer: No Typology Code available for payment source | Source: Ambulatory Visit | Attending: Radiation Oncology | Admitting: Radiation Oncology

## 2014-11-30 DIAGNOSIS — C01 Malignant neoplasm of base of tongue: Secondary | ICD-10-CM

## 2014-11-30 DIAGNOSIS — R1312 Dysphagia, oropharyngeal phase: Secondary | ICD-10-CM | POA: Insufficient documentation

## 2014-11-30 DIAGNOSIS — Z510 Encounter for antineoplastic radiation therapy: Secondary | ICD-10-CM | POA: Diagnosis not present

## 2014-11-30 NOTE — Therapy (Signed)
[facility] [address] [place], [place], [address] Phone: [phone]   Fax:  [phone]  Speech Language Pathology Evaluation  Patient Details  Name: George Nielsen MRN: [id number] Date of Birth: 1955/08/18 Referring Provider:  Eppie Gibson, MD  Encounter Date: 11/30/2014      End of Session - 11/30/14 1104    Visit Number 1   Number of Visits 1   SLP Start Time 1017   SLP Stop Time  1102   SLP Time Calculation (min) 45 min   Activity Tolerance Patient limited by pain      Past Medical History  Diagnosis Date  . Allergy   . Hypertension   . GERD (gastroesophageal reflux disease)   . Ulcer   . Hepatitis C   . COPD (chronic obstructive pulmonary disease)   . PNA (pneumonia)     Past Surgical History  Procedure Laterality Date  . Tonsillectomy    . Back surgery    . Shoulder surgery      There were no vitals filed for this visit.  Visit Diagnosis: Oropharyngeal dysphagia      Subjective Assessment - 11/30/14 1028    Subjective "I've lost weight - 5 lbs in the past week"   Currently in Pain? Yes   Pain Score 9    Pain Location Throat   Pain Descriptors / Indicators Burning;Constant;Aching   Pain Type Chronic pain   Pain Radiating Towards mouth, tongue oral tissue   Aggravating Factors  being awake   Pain Relieving Factors lidocaine   Multiple Pain Sites No            SLP Evaluation [facility] - 11/30/14 1028    General Information   Other Pertinent Information 59 y.o. male diagnosed with base of tongue cancer diagnosed 09/2014. Pt receiving radation and chemo since 10/2014. He denies s/s of aspiration. Pt has preventative trach and PEG TF to supplement PO.    Behavioral/Cognition WFL   Mobility Status walks independently, denies falls.   Prior Functional Status   Type of Home House    Lives With Significant other   Vocation On disability              Oral Motor/Sensory  Function - 11/30/14 1052    Oral Motor/Sensory Function   Overall Oral Motor/Sensory Function Appears within functional limits for tasks assessed   Labial ROM Within Functional Limits          Thin Liquid - 11/30/14 1052    Thin Liquid   Thin Liquid Within functional limits   Presentation Cup           Puree - 11/30/14 1052    Puree   Puree Within functional limits   Presentation Self Fed   Other Comments pain with swallow           ADULT SLP TREATMENT - 11/30/14 1124    Cognitive-Linquistic Treatment   Skilled Treatment Pt. trained in dysphagia HEP with min A. Pt educated re:s/s of aspiration, reflux precautions with PEG TF. Diet modfications and swallow strategies also trained with supervision cues. Pt. Provided handout of HEP, oral hygiene and diet recommendations. He verbalized understanding and demonstrated adequate performance of HEP.               SLP Long Term Goals - 11/30/14 1121    SLP LONG TERM GOAL #1   Title Pt will complete dysphagia HEP with min A   Time 1  Period Days   Status Achieved   SLP LONG TERM GOAL #2   Title Pt will verbalize s/s of aspiration with min A   Time 1   Status Achieved          Plan - 11/30/14 1131    Clinical Impression Statement George Nielsen, a 59 y.o. male is curently undergoing radiation and chemotherapy for base of tongue cancer. He is referred to ST for dysphagia prophylaxis. At this time, George Nielsen is experiencing esophagitis/mucositis with significant pharyngeal pain at rest and during swallowing. He is using PEG TF to supplement PO and has a trach with speaking valve. Pt denies any s/s of aspiration with meals or meds.  A clinical swallow evaluation revealed no overt s/s of aspiration with puree or thin liquids.  Swallow timely, voice clear after all PO. Laryngeal elevation is slightly limited due to trach. Pt was trained in prophylatcic dysphagia exercises  with min assistance. Pt 's performance of  exercises  was limited by pain. Pt instructecd to complete exercises daily to maintain swallow function. Pt educated re: diet modifications for ease of swallowing pills and meals, oral hygiene protocol. Pt verbalized s/s of aspiration and was instructed to let his doctor know if s/s of aspiration are noted. Recommend pt continue on soft solids and thin liquids as tolerated.          Problem List Patient Active Problem List   Diagnosis Date Noted  . Malignant neoplasm of base of tongue 10/27/2014  . Chronic kidney disease   . COPD (chronic obstructive pulmonary disease) 07/10/2012  . Chest pain 07/10/2012  . HTN (hypertension) 07/10/2012  . Hyponatremia 07/10/2012  . TOBACCO USE 02/07/2010    George Nielsen, George Rusk MS, CCC-SLP 11/30/2014, 11:31 AM  [facility] [address] [place], [place], [address] Phone: [phone]   Fax:  [phone]

## 2014-11-30 NOTE — Patient Instructions (Addendum)
SWALLOWING EXERCISES 1. Effortful Swallows - Squeeze hard with the muscles in your neck while you swallow your  saliva or a sip of water - Repeat 20 times, 2-3 times a day, and use whenever you eat or drink  2. Masako Swallow - swallow with your tongue sticking out - Stick tongue out and gently bite tongue with your teeth - Swallow, while holding your tongue with your teeth - Repeat 20 times, 2-3 times a day  3. Pitch Raise - Repeat "he", once per second in as high of a pitch as you can - Repeat 20 times, 2-3 times a day  4. Shaker Exercise - head lift - Lie flat on your back in your bed or on a couch without pillows - Raise your head and look at your feet  - KEEP YOUR SHOULDERS DOWN - HOLD FOR 45 SECONDS, then lower your head back down - Repeat 3 times, 2-3 times a day  5. Mendelsohn Maneuver - "half swallow" exercise - Start to swallow, and keep your Adam's apple up by squeezing hard with the muscles of the throat - Hold the squeeze for 5-7 seconds and then relax - Repeat 20 times, 2-3 times a day  6. Tongue Press - Press your entire tongue as hard as you can against the roof of your mouth for 3-5 seconds - Repeat 20 times, 2-3 times a day  7. Tongue Stretch/Teeth Clean - Move your tongue around the pocket between your gums and teeth, clockwise and then counter-clockwise - Repeat on the back side, clockwise and then counter-clockwise - Repeat 15-20 times, 2-3 times a day  8. Breath Hold - Say "HUH!" loudly, holding your breath tightly at the level of your voice box for 3 seconds - Repeat 20 times, 2-3 times a day  9. Chin pushback - Open your mouth  - Place your fist UNDER your chin near your neck, and push back with your fist for 5 seconds  - Repeat 20 times, 2-3 times a day     10. Open mouth swallow          -Swallow with your mouth open wide          -Repeat 15 times, 2 times a day  11. Stick out your tongue and say "GA-GA-GA" loud and shart           -Repeat  25 sets of 3, 2-3 times a day  12. Say ING-GA loud and exaggerated             - 25 times 2-3 times a day  13. Gargle or pretend to gargle             - 10 times for 3-5 seconds (or as long as you can) 2 times a day    Radiation Therapy Side Effects: Esophagitis and Mucositis   Will Radiation therapy make my mouth or throat hurt? The lining of your esophagus (food pipe) is sensitive to radiation and may become inflamed and sore during treatments, a condition called esophagitis.  You may feel a burning sensation in your throat or chest or you may feel as if you have a "lump" in your throat.  You may also feel pain when swallowing.  The lining of your mouth, throat, and gums is called the oral mucosa.  This lining is sensitive to radiation and may become inflamed or sore during treatments, a condition called Mucositis.  You may have a dry mouth with thick, sticky saliva.  You may  also have mouth sores or discomfort when chewing or swallowing.  Some patients receiving radiation treatments to the mouth may be referred to a dentist, but most patients will be referred to a registered dietitian.   The symptoms of esophagitis and Mucositis may occur during the second or third week of radiation therapy.  The symptoms are common and temporary - they will subside gradually within two to three weeks of completing treatment.   What should I do if I have a sore mouth or feel pain when swallowing? To reduce the discomfort caused by esophagitis or mucositis, follow these guidelines: . Sit upright at a 90-degree angle and lean your head slightly forward. . Eat slowly.  Cut your food into small pieces and chew it completely. . Eat small, frequent meals throughout the day instead of three large meals. . Eat foods that are warm or are at room temperature.  Avoid hot foods and drinks.  Also avoid crunchy foods, such as potato chips and nuts. . Eat soft foods.  Puree or finely chop cooked meats, fruits, and  vegetables.  You may want to try commercial baby foods, which are nutritious, convenient, and easy to swallow.  High-protein milkshakes are also nutritious and easy to swallow. . Drink liquids through a straw to make swallowing easier. . If you are experiencing a burning sensation when eating or drinking, try taking an antacid before your meals. . If swallowing is painful, try rinsing your mouth with a local anesthetic or taking a pain medication prescribed by your health care provider before you eat. . Do not talk with food in your mouth. . Remain sitting or standing upright for 15 to 20 minutes after eating a meal. . Avoid eating spicy foods. . Avoid eating or drinking acidic foods and beverages such as tomatoes, oranges, grapefruits and their juices.  Instead, try nectars and imitation fruit drinks with vitamin C. . Avoid alcohol and tobacco.   What should I do if I am having difficulty swallowing my medications? If you are having difficulty swallowing your medications, ask your pharmacist which medications can be crushed.  If your pharmacist tells you it is safe to crush your medications, mix them with soft foods such as applesauce or pudding.  It is important to ask your pharmacist for his/her recommendations on which pills should NOT be crushed.   How can I relieve the discomfort of a dry mouth? . Rinse your mouth with water before meals. Sarina Ser your food completely.  Sip liquids frequently while eating to keep food moist and to help with swallowing. . Drink six to eight 8-ounce glasses of fluid per day.  You may want to carry a small bottle of water with you. Sarina Ser sugarless gum or suck on hard candies or mints, preferably sugarless. Desma Paganini with club soda or drink fluids with lemon or lime to help "cut" think saliva. . Run a cold air humidifier in your main living area during the day and in your bedroom at night.  If you do not own a humidifier or vaporizer, boil a pan of water and  carefully inhale the steam. . Ask you healthcare provider about artificial saliva.  This may be prescribed if your mouth dryness is severe. . Avoid tobacco and alcohol.   What are some oral hygiene tips? Good mouth care may not prevent side effects to your mouth, but it helps prevent infection and the spread of infection.  Good mouth care also helps reduce the risk of  further reaction on tissue.  Lastly, good oral hygiene prevents bad breath, leaves a fresh feeling in your mouth, and improves your appetite.  Here are some tips: . Examine your mouth and gums daily.  Your healthcare provider may recommend that you see your dentist before your radiation treatments begin. . Brush your teeth after each mean with a small, soft toothbrush and fluoride toothpaste.  Use foam sticks instead of a toothbrush if gums are especially sore.  Keep dentures clean and fitting properly. . Use dental floss daily . Avoid commercial mouthwashes or lozenges that contain a high concentration of alcohol - they may irritate or dry your mouth. . Prepare a gargle by dissolving one teaspoon each of salt and baking soda in a quart of warm water.  A smaller portion of this can be made by stirring  teaspoon each of sale and baking soda into eight ounces of warm water.  Rinse your mouth and gargle with this solution at least four to six times a day, especially after meals and before going to bed.   Will I still be able to wear my dentures if I'm receiving radiation to the mouth? If your gums become inflamed or sore during your radiation treatment, you may have difficulty wearing dentures.  Often, dentures may have to be removed during treatment and refitted three to six months after your treatment is complete.   Will radiation therapy change my sense of taste? You sense of taste may change during radiation treatments.  Different foods may seem to taste the same, have a slightly bitter taste, or not have any taste at all.   Despite changes in you sense of taste, it is very important to continue eating well-balanced meals to avoid losing weight.  Foods that are slightly chilled (flavored gelatin, pudding, and applesauce) may be tolerated better.  Meat commonly becomes distasteful after several weeks of treatment.  If you are unable to eat meat because it is distasteful, be sure that you have another protein source in your diet.  Try eating more fish, poultry, eggs, cheese, and milk.  Adding protein supplements to your meal plan usually becomes necessary when your sense of taste changes.  A registered dietitian can recommend a supplement brand to meet your nutritional needs.     Is there anyone available to address my individual nutritional concerns?  Yes.  Registered dietitians can help you with any nutritional concerns you may have.  Dietitians are available to help you modify your diet and advise you on recipes and nutritional supplements to provide additional calories, protein and other nutrients.                                 Copyright 1995-2010 [facility].  All rights reserved.

## 2014-12-01 ENCOUNTER — Ambulatory Visit
Admission: RE | Admit: 2014-12-01 | Discharge: 2014-12-01 | Disposition: A | Discharge status: Home / Self Care | Payer: 59 | Source: Ambulatory Visit | Attending: Radiation Oncology | Admitting: Radiation Oncology

## 2014-12-01 ENCOUNTER — Encounter: Payer: Self-pay | Admitting: Radiation Oncology

## 2014-12-01 ENCOUNTER — Ambulatory Visit
Admission: RE | Admit: 2014-12-01 | Discharge: 2014-12-01 | Disposition: A | Discharge status: Home / Self Care | Payer: No Typology Code available for payment source | Source: Ambulatory Visit | Attending: Radiation Oncology | Admitting: Radiation Oncology

## 2014-12-01 VITALS — BP 92/67 | HR 87 | Temp 97.4°F | Resp 16 | Wt 158.7 lb

## 2014-12-01 DIAGNOSIS — C01 Malignant neoplasm of base of tongue: Secondary | ICD-10-CM

## 2014-12-01 DIAGNOSIS — Z510 Encounter for antineoplastic radiation therapy: Secondary | ICD-10-CM | POA: Diagnosis not present

## 2014-12-01 NOTE — Progress Notes (Signed)
Weekly Management Note:  Outpatient    ICD-9-CM ICD-10-CM   1. Malignant neoplasm of base of tongue 141.0 C01     Current Dose:  30 Gy  Projected Dose: 70 Gy   Narrative:  The patient presents for routine under treatment assessment.  CBCT/MVCT images/Port film x-rays were reviewed.  The chart was checked.  Patient with significant weight loss. Denies dizziness or need for hydration. States, "they will hydrate me with my chemo on Friday." Denies eating anything by mouth due to taste changes. Reports instilling 5 cans of feeding via PEG tube daily. Drinks 3-4 pints of h20 daily. [facility] but, he is only able to manage five cans. Reports nausea but, denies emesis. Reports diarrhea in the morning related to the effects of his tube feeding.    Reports fatigue.   Physical Findings:  Wt Readings from Last 3 Encounters:  12/01/14 158 lb 11.2 oz (71.986 kg)  11/22/14 168 lb (76.204 kg)  11/15/14 169 lb 8 oz (76.885 kg)    weight is 158 lb 11.2 oz (71.986 kg). His oral temperature is 97.4 F (36.3 C). His blood pressure is 92/67 and his pulse is 87. His respiration is 16 and oxygen saturation is 100%.  skin slightly erythematous but intact over neck, erythematous oropharyngeal mucosa, no thrush  Vitals with BMI 12/01/2014 12/01/2014  Height    Weight 158 lbs 11 oz 158 lbs 11 oz  BMI    Systolic 92 409  Diastolic 67 68  Pulse 87 82  Respirations 16 16   CBC    Component Value Date/Time   WBC 8.5 11/01/2014 0833   WBC 5.6 03/16/2013 1116   RBC 4.53 11/01/2014 0833   RBC 4.54 03/16/2013 1116   HGB 14.5 11/01/2014 0833   HGB 14.9 03/16/2013 1116   HCT 41.9 11/01/2014 0833   HCT 41.9 03/16/2013 1116   PLT 224 11/01/2014 0833   PLT 269 03/16/2013 1116   MCV 92.6 11/01/2014 0833   MCV 92.3 03/16/2013 1116   MCH 32.0 11/01/2014 0833   MCH 32.8 03/16/2013 1116   MCHC 34.5 11/01/2014 0833   MCHC 35.6 03/16/2013 1116   RDW 12.2 11/01/2014 0833   RDW 13.6  03/16/2013 1116   LYMPHSABS 1.2 11/01/2014 0833   LYMPHSABS 1.5 03/16/2013 1116   MONOABS 0.8 11/01/2014 0833   MONOABS 0.9 03/16/2013 1116   EOSABS 0.2 11/01/2014 0833   EOSABS 0.0 03/16/2013 1116   BASOSABS 0.1 11/01/2014 0833   BASOSABS 0.0 03/16/2013 1116     CMP     Component Value Date/Time   NA 136 11/01/2014 0833   NA 134* 03/16/2013 1116   K 4.1 11/01/2014 0833   K 4.0 03/16/2013 1116   CL 97 03/16/2013 1116   CO2 26 11/01/2014 0833   CO2 26 03/16/2013 1116   GLUCOSE 107 11/01/2014 0833   GLUCOSE 98 03/16/2013 1116   BUN 6.4* 11/01/2014 0833   BUN 12 03/16/2013 1116   CREATININE 0.8 11/01/2014 0833   CREATININE 0.79 03/16/2013 1116   CALCIUM 9.1 11/01/2014 0833   CALCIUM 8.9 03/16/2013 1116   PROT 6.9 11/01/2014 0833   PROT 6.4 07/11/2012 0303   ALBUMIN 3.5 11/01/2014 0833   ALBUMIN 3.3* 07/11/2012 0303   AST 35* 11/01/2014 0833   AST 32 07/11/2012 0303   ALT 33 11/01/2014 0833   ALT 30 07/11/2012 0303   ALKPHOS 65 11/01/2014 0833   ALKPHOS 54 07/11/2012 0303   BILITOT  0.53 11/01/2014 0833   BILITOT 0.5 07/11/2012 0303   GFRNONAA >90 03/16/2013 1116   GFRAA >90 03/16/2013 1116     Impression:  The patient is tolerating radiotherapy.   Plan:  Continue radiotherapy as planned. Reviewed goals of care in depth and benefits of PO intake, PEG feedings, weight maintenance.  Continue following w/ Dory Peru.    -----------------------------------  Eppie Gibson, MD

## 2014-12-01 NOTE — Progress Notes (Addendum)
Patient slightly orthostatic with weight loss. Denies need for hydration. States, "they will hydrate me with my chemo on Friday." Denies eating anything by mouth due to taste changes. Reports instilling 5 cans of feeding via PEG tube daily. [facility] but, he is only able to manage five cans. Reports nausea but, denies emesis. Reports diarrhea in the morning related to the effects of his tube feeding. Trach noted without redness or irritation. Hyperpigmented skin noted within treatment field without desquamation. Denies using biafine on treated skin. Reports fatigue.   BP 92/67 mmHg  Pulse 87  Temp(Src) 97.4 F (36.3 C) (Oral)  Resp 16  SpO2 100% Wt Readings from Last 3 Encounters:  12/01/14 158 lb 11.2 oz (71.986 kg)  11/22/14 168 lb (76.204 kg)  11/15/14 169 lb 8 oz (76.885 kg)

## 2014-12-02 ENCOUNTER — Ambulatory Visit
Admission: RE | Admit: 2014-12-02 | Discharge: 2014-12-02 | Disposition: A | Discharge status: Home / Self Care | Payer: No Typology Code available for payment source | Source: Ambulatory Visit | Attending: Radiation Oncology | Admitting: Radiation Oncology

## 2014-12-02 DIAGNOSIS — Z510 Encounter for antineoplastic radiation therapy: Secondary | ICD-10-CM | POA: Diagnosis not present

## 2014-12-03 ENCOUNTER — Ambulatory Visit
Admission: RE | Admit: 2014-12-03 | Discharge: 2014-12-03 | Disposition: A | Discharge status: Home / Self Care | Payer: No Typology Code available for payment source | Source: Ambulatory Visit | Attending: Radiation Oncology | Admitting: Radiation Oncology

## 2014-12-03 ENCOUNTER — Encounter: Payer: Self-pay | Admitting: Nutrition

## 2014-12-03 ENCOUNTER — Encounter: Payer: 59 | Admitting: Nutrition

## 2014-12-03 DIAGNOSIS — Z510 Encounter for antineoplastic radiation therapy: Secondary | ICD-10-CM | POA: Diagnosis not present

## 2014-12-03 NOTE — Progress Notes (Signed)
Patient did not show up for nutrition follow-up. 

## 2014-12-06 ENCOUNTER — Encounter: Payer: Self-pay | Admitting: Radiation Oncology

## 2014-12-06 ENCOUNTER — Ambulatory Visit
Admission: RE | Admit: 2014-12-06 | Discharge: 2014-12-06 | Disposition: A | Discharge status: Home / Self Care | Payer: 59 | Source: Ambulatory Visit | Attending: Radiation Oncology | Admitting: Radiation Oncology

## 2014-12-06 ENCOUNTER — Ambulatory Visit
Admission: RE | Admit: 2014-12-06 | Discharge: 2014-12-06 | Disposition: A | Discharge status: Home / Self Care | Payer: No Typology Code available for payment source | Source: Ambulatory Visit | Attending: Radiation Oncology | Admitting: Radiation Oncology

## 2014-12-06 ENCOUNTER — Encounter: Payer: Self-pay | Admitting: *Deleted

## 2014-12-06 VITALS — BP 126/65 | HR 75 | Temp 98.0°F | Resp 18 | Wt 157.6 lb

## 2014-12-06 DIAGNOSIS — C01 Malignant neoplasm of base of tongue: Secondary | ICD-10-CM

## 2014-12-06 DIAGNOSIS — Z510 Encounter for antineoplastic radiation therapy: Secondary | ICD-10-CM | POA: Diagnosis not present

## 2014-12-06 NOTE — Progress Notes (Signed)
   Weekly Management Note:  Outpatient    ICD-9-CM ICD-10-CM   1. Malignant neoplasm of base of tongue 141.0 C01     Current Dose:  36 Gy  Projected Dose: 70 Gy   Narrative:  The patient presents for routine under treatment assessment.  CBCT/MVCT images/Port film x-rays were reviewed.  The chart was checked.   Pt is discouraged. Whole body "feels pukey."  Cannot articulate further.  Tempted to stop treatment.  Physical Findings:  Wt Readings from Last 3 Encounters:  12/06/14 157 lb 9.6 oz (71.487 kg)  12/01/14 158 lb 11.2 oz (71.986 kg)  11/22/14 168 lb (76.204 kg)    weight is 157 lb 9.6 oz (71.487 kg). His oral temperature is 98 F (36.7 C). His blood pressure is 126/65 and his pulse is 75. His respiration is 18 and oxygen saturation is 100%.  skin  hyperpigmented but intact over neck, erythematous oropharyngeal mucosa, no thrush    CBC    Component Value Date/Time   WBC 8.5 11/01/2014 0833   WBC 5.6 03/16/2013 1116   RBC 4.53 11/01/2014 0833   RBC 4.54 03/16/2013 1116   HGB 14.5 11/01/2014 0833   HGB 14.9 03/16/2013 1116   HCT 41.9 11/01/2014 0833   HCT 41.9 03/16/2013 1116   PLT 224 11/01/2014 0833   PLT 269 03/16/2013 1116   MCV 92.6 11/01/2014 0833   MCV 92.3 03/16/2013 1116   MCH 32.0 11/01/2014 0833   MCH 32.8 03/16/2013 1116   MCHC 34.5 11/01/2014 0833   MCHC 35.6 03/16/2013 1116   RDW 12.2 11/01/2014 0833   RDW 13.6 03/16/2013 1116   LYMPHSABS 1.2 11/01/2014 0833   LYMPHSABS 1.5 03/16/2013 1116   MONOABS 0.8 11/01/2014 0833   MONOABS 0.9 03/16/2013 1116   EOSABS 0.2 11/01/2014 0833   EOSABS 0.0 03/16/2013 1116   BASOSABS 0.1 11/01/2014 0833   BASOSABS 0.0 03/16/2013 1116     CMP     Component Value Date/Time   NA 136 11/01/2014 0833   NA 134* 03/16/2013 1116   K 4.1 11/01/2014 0833   K 4.0 03/16/2013 1116   CL 97 03/16/2013 1116   CO2 26 11/01/2014 0833   CO2 26 03/16/2013 1116   GLUCOSE 107 11/01/2014 0833   GLUCOSE 98 03/16/2013 1116   BUN 6.4* 11/01/2014 0833   BUN 12 03/16/2013 1116   CREATININE 0.8 11/01/2014 0833   CREATININE 0.79 03/16/2013 1116   CALCIUM 9.1 11/01/2014 0833   CALCIUM 8.9 03/16/2013 1116   PROT 6.9 11/01/2014 0833   PROT 6.4 07/11/2012 0303   ALBUMIN 3.5 11/01/2014 0833   ALBUMIN 3.3* 07/11/2012 0303   AST 35* 11/01/2014 0833   AST 32 07/11/2012 0303   ALT 33 11/01/2014 0833   ALT 30 07/11/2012 0303   ALKPHOS 65 11/01/2014 0833   ALKPHOS 54 07/11/2012 0303   BILITOT 0.53 11/01/2014 0833   BILITOT 0.5 07/11/2012 0303   GFRNONAA >90 03/16/2013 1116   GFRAA >90 03/16/2013 1116     Impression:  The patient is tolerating radiotherapy.   Plan:  Continue radiotherapy as planned. Reviewed goals of care in depth and risks of stopping RT.  Patient will discuss pros/cons of stopping chemotherapy with med/onc. Adamantly declines SW referral, mentor assignment.   Will meet with Gayleen Orem, RN, our Head and Neck Oncology Navigator for encouragement today. Willing to continue RT. -----------------------------------  Eppie Gibson, MD

## 2014-12-06 NOTE — Progress Notes (Signed)
Weekly rad txs b/l neck, erythem,skin dry, not using biafione cream, throat 5/10 pain, not using carafate stated,  4-5 cans supplement,  Not ure name gets from [place], he will bring in name , flat affect ,uses bioten rinses, thick saliva  Fatigued 8:46 AM BP 126/65 mmHg  Pulse 75  Temp(Src) 98 F (36.7 C) (Oral)  Resp 18  Wt 157 lb 9.6 oz (71.487 kg)  SpO2 100%  Wt Readings from Last 3 Encounters:  12/06/14 157 lb 9.6 oz (71.487 kg)  12/01/14 158 lb 11.2 oz (71.986 kg)  11/22/14 168 lb (76.204 kg)

## 2014-12-07 ENCOUNTER — Ambulatory Visit
Admission: RE | Admit: 2014-12-07 | Discharge: 2014-12-07 | Disposition: A | Discharge status: Home / Self Care | Payer: No Typology Code available for payment source | Source: Ambulatory Visit | Attending: Radiation Oncology | Admitting: Radiation Oncology

## 2014-12-07 DIAGNOSIS — Z510 Encounter for antineoplastic radiation therapy: Secondary | ICD-10-CM | POA: Diagnosis not present

## 2014-12-07 NOTE — Progress Notes (Signed)
  Oncology Nurse Navigator Documentation   Navigator Encounter Type: Clinic/MDC (12/06/14 0915) Patient Visit Type: Radonc (12/06/14 0915) Treatment Phase: Treatment (12/06/14 0915)     To provide support and encouragement, care continuity and to assess for needs, met with patient during [place] with Dr. Isidore Moos s/p today's Tom[place] tmt. Patient strongly expressed that he did not feel like he could continue with RT and chemotherapy, "I've never felt this bad". After seeing Dr. Isidore Moos, we talked at length about his current struggle. He recognized he is feeling RT SEs, particularly dry mouth (he drinks "lots of water", thickened saliva, throat soreness, fatigue.  I acknowledged his struggle.  I encouraged him to use salt water / baking soda rinse multiple times daily to help manage thickened saliva.  I provided him the recipe. He stated current PRN medication satisfactorily controls throat pain.   He is drinking some Ensure, putting 4-6 cans daily through PEG. He stated he believes the chemotherapy effects him more than RT.  I encouraged him to discuss this with his [place].    I encouraged him to continue with RT, assured him that we will support him during the remaining tmts. He agree to continue tmts. I will continue to follow closely.  Gayleen Orem, RN, BSN, [place] at [place] [phone]                  Time Spent with Patient: 45 (12/06/14 0915)

## 2014-12-08 ENCOUNTER — Ambulatory Visit
Admission: RE | Admit: 2014-12-08 | Discharge: 2014-12-08 | Disposition: A | Discharge status: Home / Self Care | Payer: No Typology Code available for payment source | Source: Ambulatory Visit | Attending: Radiation Oncology | Admitting: Radiation Oncology

## 2014-12-08 DIAGNOSIS — Z510 Encounter for antineoplastic radiation therapy: Secondary | ICD-10-CM | POA: Diagnosis not present

## 2014-12-09 ENCOUNTER — Ambulatory Visit
Admission: RE | Admit: 2014-12-09 | Discharge: 2014-12-09 | Disposition: A | Discharge status: Home / Self Care | Payer: No Typology Code available for payment source | Source: Ambulatory Visit | Attending: Radiation Oncology | Admitting: Radiation Oncology

## 2014-12-09 DIAGNOSIS — Z510 Encounter for antineoplastic radiation therapy: Secondary | ICD-10-CM | POA: Diagnosis not present

## 2014-12-10 ENCOUNTER — Ambulatory Visit
Admission: RE | Admit: 2014-12-10 | Discharge: 2014-12-10 | Disposition: A | Discharge status: Home / Self Care | Payer: No Typology Code available for payment source | Source: Ambulatory Visit | Attending: Radiation Oncology | Admitting: Radiation Oncology

## 2014-12-10 ENCOUNTER — Telehealth: Payer: Self-pay | Admitting: *Deleted

## 2014-12-10 DIAGNOSIS — Z510 Encounter for antineoplastic radiation therapy: Secondary | ICD-10-CM | POA: Diagnosis not present

## 2014-12-10 NOTE — Telephone Encounter (Signed)
  Oncology Nurse Navigator Documentation    Navigator Encounter Type: Treatment (12/10/14 1438) Patient Visit Type: ELYHTM (12/10/14 1438)       Interventions: Coordination of Care (12/10/14 1438)     Per call from RTT Jehna, called patient, LVM indicating machine is down, expect operational around 3:30, he can expect delay when he arrives.  Gayleen Orem, RN, BSN, [place] at [place] [phone]           Time Spent with Patient: 15 (12/10/14 1438)

## 2014-12-13 ENCOUNTER — Encounter: Payer: Self-pay | Admitting: Radiation Oncology

## 2014-12-13 ENCOUNTER — Ambulatory Visit
Admission: RE | Admit: 2014-12-13 | Discharge: 2014-12-13 | Disposition: A | Discharge status: Home / Self Care | Payer: 59 | Source: Ambulatory Visit | Attending: Radiation Oncology | Admitting: Radiation Oncology

## 2014-12-13 ENCOUNTER — Ambulatory Visit
Admission: RE | Admit: 2014-12-13 | Discharge: 2014-12-13 | Disposition: A | Discharge status: Home / Self Care | Payer: No Typology Code available for payment source | Source: Ambulatory Visit | Attending: Radiation Oncology | Admitting: Radiation Oncology

## 2014-12-13 VITALS — BP 127/60 | HR 69 | Resp 16 | Wt 159.4 lb

## 2014-12-13 DIAGNOSIS — C01 Malignant neoplasm of base of tongue: Secondary | ICD-10-CM

## 2014-12-13 DIAGNOSIS — Z510 Encounter for antineoplastic radiation therapy: Secondary | ICD-10-CM | POA: Diagnosis not present

## 2014-12-13 MED ORDER — FENTANYL 25 MCG/HR TD PT72: 25.0000 ug | MEDICATED_PATCH | TRANSDERMAL | Status: DC

## 2014-12-13 NOTE — Progress Notes (Signed)
   Weekly Management Note:  Outpatient    ICD-9-CM ICD-10-CM   1. Malignant neoplasm of base of tongue 141.0 C01 fentaNYL (DURAGESIC - DOSED MCG/HR) 25 MCG/HR patch    Current Dose:  46 Gy  Projected Dose: 70 Gy   Narrative:  The patient presents for routine under treatment assessment.  CBCT/MVCT images/Port film x-rays were reviewed.  The chart was checked. Patient gaining weight. Trach removed last week, stoma has closed. Is not using Biafine. Plans to refuse chemotherapy this week.  Sore throat - severe.  Physical Findings:  Wt Readings from Last 3 Encounters:  12/13/14 159 lb 6.4 oz (72.303 kg)  12/06/14 157 lb 9.6 oz (71.487 kg)  12/01/14 158 lb 11.2 oz (71.986 kg)    weight is 159 lb 6.4 oz (72.303 kg). His blood pressure is 127/60 and his pulse is 69. His respiration is 16.  NAD, dry skin over neck, patchy mucositis and erythema over oropharynx/palate; no thrush  CBC    Component Value Date/Time   WBC 8.5 11/01/2014 0833   WBC 5.6 03/16/2013 1116   RBC 4.53 11/01/2014 0833   RBC 4.54 03/16/2013 1116   HGB 14.5 11/01/2014 0833   HGB 14.9 03/16/2013 1116   HCT 41.9 11/01/2014 0833   HCT 41.9 03/16/2013 1116   PLT 224 11/01/2014 0833   PLT 269 03/16/2013 1116   MCV 92.6 11/01/2014 0833   MCV 92.3 03/16/2013 1116   MCH 32.0 11/01/2014 0833   MCH 32.8 03/16/2013 1116   MCHC 34.5 11/01/2014 0833   MCHC 35.6 03/16/2013 1116   RDW 12.2 11/01/2014 0833   RDW 13.6 03/16/2013 1116   LYMPHSABS 1.2 11/01/2014 0833   LYMPHSABS 1.5 03/16/2013 1116   MONOABS 0.8 11/01/2014 0833   MONOABS 0.9 03/16/2013 1116   EOSABS 0.2 11/01/2014 0833   EOSABS 0.0 03/16/2013 1116   BASOSABS 0.1 11/01/2014 0833   BASOSABS 0.0 03/16/2013 1116     CMP     Component Value Date/Time   NA 136 11/01/2014 0833   NA 134* 03/16/2013 1116   K 4.1 11/01/2014 0833   K 4.0 03/16/2013 1116   CL 97 03/16/2013 1116   CO2 26 11/01/2014 0833   CO2 26 03/16/2013 1116   GLUCOSE 107 11/01/2014 0833   GLUCOSE 98 03/16/2013 1116   BUN 6.4* 11/01/2014 0833   BUN 12 03/16/2013 1116   CREATININE 0.8 11/01/2014 0833   CREATININE 0.79 03/16/2013 1116   CALCIUM 9.1 11/01/2014 0833   CALCIUM 8.9 03/16/2013 1116   PROT 6.9 11/01/2014 0833   PROT 6.4 07/11/2012 0303   ALBUMIN 3.5 11/01/2014 0833   ALBUMIN 3.3* 07/11/2012 0303   AST 35* 11/01/2014 0833   AST 32 07/11/2012 0303   ALT 33 11/01/2014 0833   ALT 30 07/11/2012 0303   ALKPHOS 65 11/01/2014 0833   ALKPHOS 54 07/11/2012 0303   BILITOT 0.53 11/01/2014 0833   BILITOT 0.5 07/11/2012 0303   GFRNONAA >90 03/16/2013 1116   GFRAA >90 03/16/2013 1116     Impression:  The patient is tolerating radiotherapy.   Plan:  Continue radiotherapy as planned. Rx for 1 patch of Duragesic 22mcg. Patient will discuss long acting narcotics with med/onc on Thursday to determine long term Rx's.  -----------------------------------  Eppie Gibson, MD

## 2014-12-13 NOTE — Progress Notes (Signed)
Vitals stable. Weight gain noted. Reports dry mouth. Trach removed Friday. Old trach site has closed. Patient reports severe throat pain when he attempt to drink water or swallow. Hyperpigmentation with dry desquamation of his anterior neck. Denies using Biafine. Encouraged patient to resume biafine plus apply neosporin to area of desquamation. Patient verbalized understanding. Reports instilling 5-6 cans of feeding via PEG tube.   BP 127/60 mmHg  Pulse 69  Resp 16  Wt 159 lb 6.4 oz (72.303 kg) Wt Readings from Last 3 Encounters:  12/13/14 159 lb 6.4 oz (72.303 kg)  12/06/14 157 lb 9.6 oz (71.487 kg)  12/01/14 158 lb 11.2 oz (71.986 kg)

## 2014-12-14 ENCOUNTER — Encounter: Payer: 59 | Admitting: Nutrition

## 2014-12-14 ENCOUNTER — Encounter: Payer: Self-pay | Admitting: Nutrition

## 2014-12-14 ENCOUNTER — Ambulatory Visit
Admission: RE | Admit: 2014-12-14 | Discharge: 2014-12-14 | Disposition: A | Discharge status: Home / Self Care | Payer: No Typology Code available for payment source | Source: Ambulatory Visit | Attending: Radiation Oncology | Admitting: Radiation Oncology

## 2014-12-14 DIAGNOSIS — Z510 Encounter for antineoplastic radiation therapy: Secondary | ICD-10-CM | POA: Diagnosis not present

## 2014-12-14 NOTE — Progress Notes (Signed)
Patient did not show up for nutrition appointment. Patient is being followed at the [facility] in [place] for nutrition support. There are no more follow-ups for nutrition services here at [facility] health however patient has my number for questions.

## 2014-12-15 ENCOUNTER — Ambulatory Visit
Admission: RE | Admit: 2014-12-15 | Discharge: 2014-12-15 | Disposition: A | Discharge status: Home / Self Care | Payer: No Typology Code available for payment source | Source: Ambulatory Visit | Attending: Radiation Oncology | Admitting: Radiation Oncology

## 2014-12-15 DIAGNOSIS — Z510 Encounter for antineoplastic radiation therapy: Secondary | ICD-10-CM | POA: Diagnosis not present

## 2014-12-16 ENCOUNTER — Ambulatory Visit
Admission: RE | Admit: 2014-12-16 | Discharge: 2014-12-16 | Disposition: A | Discharge status: Home / Self Care | Payer: No Typology Code available for payment source | Source: Ambulatory Visit | Attending: Radiation Oncology | Admitting: Radiation Oncology

## 2014-12-16 DIAGNOSIS — Z510 Encounter for antineoplastic radiation therapy: Secondary | ICD-10-CM | POA: Diagnosis not present

## 2014-12-17 ENCOUNTER — Ambulatory Visit
Admission: RE | Admit: 2014-12-17 | Discharge: 2014-12-17 | Disposition: A | Discharge status: Home / Self Care | Payer: No Typology Code available for payment source | Source: Ambulatory Visit | Attending: Radiation Oncology | Admitting: Radiation Oncology

## 2014-12-17 DIAGNOSIS — Z510 Encounter for antineoplastic radiation therapy: Secondary | ICD-10-CM | POA: Diagnosis not present

## 2014-12-20 ENCOUNTER — Ambulatory Visit
Admission: RE | Admit: 2014-12-20 | Discharge: 2014-12-20 | Disposition: A | Discharge status: Home / Self Care | Payer: 59 | Source: Ambulatory Visit | Attending: Radiation Oncology | Admitting: Radiation Oncology

## 2014-12-20 ENCOUNTER — Ambulatory Visit
Admission: RE | Admit: 2014-12-20 | Discharge: 2014-12-20 | Disposition: A | Discharge status: Home / Self Care | Payer: No Typology Code available for payment source | Source: Ambulatory Visit | Attending: Radiation Oncology | Admitting: Radiation Oncology

## 2014-12-20 ENCOUNTER — Encounter: Payer: Self-pay | Admitting: Radiation Oncology

## 2014-12-20 ENCOUNTER — Ambulatory Visit: Admission: RE | Admit: 2014-12-20 | Payer: 59 | Source: Ambulatory Visit | Admitting: Radiation Oncology

## 2014-12-20 VITALS — BP 104/65 | HR 88 | Temp 98.3°F | Ht 72.0 in | Wt 160.0 lb

## 2014-12-20 DIAGNOSIS — Z510 Encounter for antineoplastic radiation therapy: Secondary | ICD-10-CM | POA: Diagnosis not present

## 2014-12-20 DIAGNOSIS — C01 Malignant neoplasm of base of tongue: Secondary | ICD-10-CM

## 2014-12-20 MED ORDER — BIAFINE EX EMUL
CUTANEOUS | Status: DC | PRN
  Administered 2014-12-20: 09:00:00 via TOPICAL

## 2014-12-20 NOTE — Progress Notes (Signed)
   Weekly Management Note:  Outpatient    ICD-9-CM ICD-10-CM   1. Malignant neoplasm of base of tongue 141.0 C01 topical emolient (BIAFINE) emulsion    Current Dose:  56 Gy  Projected Dose: 70 Gy   Narrative:  The patient presents for routine under treatment assessment.  CBCT/MVCT images/Port film x-rays were reviewed.  The chart was checked. Continued sore throat.  Pain meds managed by med/onc. Not using long acting meds for pain due to constipation.  Physical Findings:  Wt Readings from Last 3 Encounters:  12/20/14 160 lb (72.576 kg)  12/13/14 159 lb 6.4 oz (72.303 kg)  12/06/14 157 lb 9.6 oz (71.487 kg)    height is 6' (1.829 m) and weight is 160 lb (72.576 kg). His temperature is 98.3 F (36.8 C). His blood pressure is 104/65 and his pulse is 88.  NAD, dry skin over neck with patches of moist desquamation, patchy mucositis and erythema over oropharynx/palate; no thrush  CBC    Component Value Date/Time   WBC 8.5 11/01/2014 0833   WBC 5.6 03/16/2013 1116   RBC 4.53 11/01/2014 0833   RBC 4.54 03/16/2013 1116   HGB 14.5 11/01/2014 0833   HGB 14.9 03/16/2013 1116   HCT 41.9 11/01/2014 0833   HCT 41.9 03/16/2013 1116   PLT 224 11/01/2014 0833   PLT 269 03/16/2013 1116   MCV 92.6 11/01/2014 0833   MCV 92.3 03/16/2013 1116   MCH 32.0 11/01/2014 0833   MCH 32.8 03/16/2013 1116   MCHC 34.5 11/01/2014 0833   MCHC 35.6 03/16/2013 1116   RDW 12.2 11/01/2014 0833   RDW 13.6 03/16/2013 1116   LYMPHSABS 1.2 11/01/2014 0833   LYMPHSABS 1.5 03/16/2013 1116   MONOABS 0.8 11/01/2014 0833   MONOABS 0.9 03/16/2013 1116   EOSABS 0.2 11/01/2014 0833   EOSABS 0.0 03/16/2013 1116   BASOSABS 0.1 11/01/2014 0833   BASOSABS 0.0 03/16/2013 1116     CMP     Component Value Date/Time   NA 136 11/01/2014 0833   NA 134* 03/16/2013 1116   K 4.1 11/01/2014 0833   K 4.0 03/16/2013 1116   CL 97 03/16/2013 1116   CO2 26 11/01/2014 0833   CO2 26 03/16/2013 1116   GLUCOSE 107 11/01/2014  0833   GLUCOSE 98 03/16/2013 1116   BUN 6.4* 11/01/2014 0833   BUN 12 03/16/2013 1116   CREATININE 0.8 11/01/2014 0833   CREATININE 0.79 03/16/2013 1116   CALCIUM 9.1 11/01/2014 0833   CALCIUM 8.9 03/16/2013 1116   PROT 6.9 11/01/2014 0833   PROT 6.4 07/11/2012 0303   ALBUMIN 3.5 11/01/2014 0833   ALBUMIN 3.3* 07/11/2012 0303   AST 35* 11/01/2014 0833   AST 32 07/11/2012 0303   ALT 33 11/01/2014 0833   ALT 30 07/11/2012 0303   ALKPHOS 65 11/01/2014 0833   ALKPHOS 54 07/11/2012 0303   BILITOT 0.53 11/01/2014 0833   BILITOT 0.5 07/11/2012 0303   GFRNONAA >90 03/16/2013 1116   GFRAA >90 03/16/2013 1116     Impression:  The patient is tolerating radiotherapy.   Plan:  Continue radiotherapy as planned.  Miralax recommended for constipation.  Triple antibiotic ointment for neck. -----------------------------------  Eppie Gibson, MD

## 2014-12-20 NOTE — Progress Notes (Signed)
George Nielsen here for radiation to bilateral neck for tongue cancer. Completed 28/35 fractions. His skin is dry, and a few areas with scabbing. The back of throat with white patches, he rates throat pain at a 12. He was given another tube of biofene to apply 3 times a day.  BP 104/65 mmHg  Pulse 88  Temp(Src) 98.3 F (36.8 C)  Ht 6' (1.829 m)  Wt 160 lb (72.576 kg)  BMI 21.70 kg/m2

## 2014-12-21 ENCOUNTER — Ambulatory Visit
Admission: RE | Admit: 2014-12-21 | Discharge: 2014-12-21 | Disposition: A | Discharge status: Home / Self Care | Payer: No Typology Code available for payment source | Source: Ambulatory Visit | Attending: Radiation Oncology | Admitting: Radiation Oncology

## 2014-12-21 ENCOUNTER — Encounter: Payer: 59 | Admitting: Nutrition

## 2014-12-21 DIAGNOSIS — Z510 Encounter for antineoplastic radiation therapy: Secondary | ICD-10-CM | POA: Diagnosis not present

## 2014-12-22 ENCOUNTER — Ambulatory Visit
Admission: RE | Admit: 2014-12-22 | Discharge: 2014-12-22 | Disposition: A | Discharge status: Home / Self Care | Payer: No Typology Code available for payment source | Source: Ambulatory Visit | Attending: Radiation Oncology | Admitting: Radiation Oncology

## 2014-12-22 DIAGNOSIS — Z510 Encounter for antineoplastic radiation therapy: Secondary | ICD-10-CM | POA: Diagnosis not present

## 2014-12-23 ENCOUNTER — Telehealth: Payer: Self-pay | Admitting: *Deleted

## 2014-12-23 ENCOUNTER — Ambulatory Visit
Admission: RE | Admit: 2014-12-23 | Discharge: 2014-12-23 | Disposition: A | Discharge status: Home / Self Care | Payer: No Typology Code available for payment source | Source: Ambulatory Visit | Attending: Radiation Oncology | Admitting: Radiation Oncology

## 2014-12-23 DIAGNOSIS — Z510 Encounter for antineoplastic radiation therapy: Secondary | ICD-10-CM | POA: Diagnosis not present

## 2014-12-23 NOTE — Telephone Encounter (Signed)
  Oncology Nurse Navigator Documentation   Navigator Encounter Type: Telephone (12/23/14 1549)         Interventions: Coordination of Care (12/23/14 1549)     In follow-up to patient's VMM asking about an earlier RT appt for tomorrow, I called to inform he can be treated at 2:40 rather than 4:00.  He verbalized understanding, expressed appreciation.  Gayleen Orem, RN, BSN, [place] at [place] [phone]           Time Spent with Patient: 15 (12/23/14 1549)

## 2014-12-24 ENCOUNTER — Encounter: Payer: Self-pay | Admitting: *Deleted

## 2014-12-24 ENCOUNTER — Ambulatory Visit
Admission: RE | Admit: 2014-12-24 | Discharge: 2014-12-24 | Disposition: A | Discharge status: Home / Self Care | Payer: No Typology Code available for payment source | Source: Ambulatory Visit | Attending: Radiation Oncology | Admitting: Radiation Oncology

## 2014-12-24 DIAGNOSIS — Z510 Encounter for antineoplastic radiation therapy: Secondary | ICD-10-CM | POA: Diagnosis not present

## 2014-12-27 ENCOUNTER — Ambulatory Visit
Admission: RE | Admit: 2014-12-27 | Discharge: 2014-12-27 | Disposition: A | Discharge status: Home / Self Care | Payer: 59 | Source: Ambulatory Visit | Attending: Radiation Oncology | Admitting: Radiation Oncology

## 2014-12-27 ENCOUNTER — Telehealth: Payer: Self-pay | Admitting: *Deleted

## 2014-12-27 ENCOUNTER — Ambulatory Visit
Admission: RE | Admit: 2014-12-27 | Discharge: 2014-12-27 | Disposition: A | Discharge status: Home / Self Care | Payer: No Typology Code available for payment source | Source: Ambulatory Visit | Attending: Radiation Oncology | Admitting: Radiation Oncology

## 2014-12-27 ENCOUNTER — Encounter: Payer: Self-pay | Admitting: Radiation Oncology

## 2014-12-27 VITALS — BP 101/63 | HR 80 | Temp 98.2°F | Resp 20 | Wt 157.7 lb

## 2014-12-27 DIAGNOSIS — C01 Malignant neoplasm of base of tongue: Secondary | ICD-10-CM

## 2014-12-27 DIAGNOSIS — Z510 Encounter for antineoplastic radiation therapy: Secondary | ICD-10-CM | POA: Diagnosis not present

## 2014-12-27 NOTE — Progress Notes (Signed)
   Weekly Management Note:  Outpatient    ICD-9-CM ICD-10-CM   1. Malignant neoplasm of base of tongue (HCC) 141.0 C01     Current Dose:  66 Gy  Projected Dose: 70 Gy   Narrative:  The patient presents for routine under treatment assessment.  CBCT/MVCT images/Port film x-rays were reviewed.  The chart was checked.  Continued pain in throat, has duragesic patch 66mcg on, Oxycodone 5mg  taking 2  Every 6 hours , not taking lidocaine swish or carafate,  5-6 can iso source supplements daily, takes meds and liquids oral, no energy, taste none   Physical Findings:  Wt Readings from Last 3 Encounters:  12/27/14 157 lb 11.2 oz (71.532 kg)  12/20/14 160 lb (72.576 kg)  12/13/14 159 lb 6.4 oz (72.303 kg)    weight is 157 lb 11.2 oz (71.532 kg). His oral temperature is 98.2 F (36.8 C). His blood pressure is 101/63 and his pulse is 80. His respiration is 20 and oxygen saturation is 100%.  NAD, dry skin over neck with patches of moist desquamation, patchy mucositis and erythema over oropharynx/palate; no thrush  CBC    Component Value Date/Time   WBC 8.5 11/01/2014 0833   WBC 5.6 03/16/2013 1116   RBC 4.53 11/01/2014 0833   RBC 4.54 03/16/2013 1116   HGB 14.5 11/01/2014 0833   HGB 14.9 03/16/2013 1116   HCT 41.9 11/01/2014 0833   HCT 41.9 03/16/2013 1116   PLT 224 11/01/2014 0833   PLT 269 03/16/2013 1116   MCV 92.6 11/01/2014 0833   MCV 92.3 03/16/2013 1116   MCH 32.0 11/01/2014 0833   MCH 32.8 03/16/2013 1116   MCHC 34.5 11/01/2014 0833   MCHC 35.6 03/16/2013 1116   RDW 12.2 11/01/2014 0833   RDW 13.6 03/16/2013 1116   LYMPHSABS 1.2 11/01/2014 0833   LYMPHSABS 1.5 03/16/2013 1116   MONOABS 0.8 11/01/2014 0833   MONOABS 0.9 03/16/2013 1116   EOSABS 0.2 11/01/2014 0833   EOSABS 0.0 03/16/2013 1116   BASOSABS 0.1 11/01/2014 0833   BASOSABS 0.0 03/16/2013 1116     CMP     Component Value Date/Time   NA 136 11/01/2014 0833   NA 134* 03/16/2013 1116   K 4.1 11/01/2014 0833     K 4.0 03/16/2013 1116   CL 97 03/16/2013 1116   CO2 26 11/01/2014 0833   CO2 26 03/16/2013 1116   GLUCOSE 107 11/01/2014 0833   GLUCOSE 98 03/16/2013 1116   BUN 6.4* 11/01/2014 0833   BUN 12 03/16/2013 1116   CREATININE 0.8 11/01/2014 0833   CREATININE 0.79 03/16/2013 1116   CALCIUM 9.1 11/01/2014 0833   CALCIUM 8.9 03/16/2013 1116   PROT 6.9 11/01/2014 0833   PROT 6.4 07/11/2012 0303   ALBUMIN 3.5 11/01/2014 0833   ALBUMIN 3.3* 07/11/2012 0303   AST 35* 11/01/2014 0833   AST 32 07/11/2012 0303   ALT 33 11/01/2014 0833   ALT 30 07/11/2012 0303   ALKPHOS 65 11/01/2014 0833   ALKPHOS 54 07/11/2012 0303   BILITOT 0.53 11/01/2014 0833   BILITOT 0.5 07/11/2012 0303   GFRNONAA >90 03/16/2013 1116   GFRAA >90 03/16/2013 1116     Impression:  The patient is tolerating radiotherapy.   Plan:  Continue radiotherapy as planned. Continue currents meds; narcotic management deferred to med/onc at [facility]. Pt feels pain is severe and will discuss further with medical oncology.  F/u ~2wks weeks post RT. -----------------------------------  Eppie Gibson, MD

## 2014-12-27 NOTE — Telephone Encounter (Signed)
Called patient to inform of fu appt. Being moved up to 01-12-15 @ 11:20 am, spoke with patient and he is good with this

## 2014-12-27 NOTE — Progress Notes (Addendum)
Weekly rad txs 33/35 b/l neck compeleted, erythma  And 2 small areas crusting, using biafine and neosporin bid,  Pain 10/10 throat, has duragesic patch 65mcg on, Oxycodone 5mg  taking 2  Every 6 hours , not taking lidocaine swish or carafate,  5-6 cn iso source  Supplements daily, takes meds and liquids oral, no energy, taste  None, has 1 month follow up appt made  9:03 AM BP 101/63 mmHg  Pulse 80  Temp(Src) 98.2 F (36.8 C) (Oral)  Resp 20  Wt 157 lb 11.2 oz (71.532 kg)  SpO2 100%  Wt Readings from Last 3 Encounters:  12/27/14 157 lb 11.2 oz (71.532 kg)  12/20/14 160 lb (72.576 kg)  12/13/14 159 lb 6.4 oz (72.303 kg)

## 2014-12-28 ENCOUNTER — Ambulatory Visit
Admission: RE | Admit: 2014-12-28 | Discharge: 2014-12-28 | Disposition: A | Discharge status: Home / Self Care | Payer: No Typology Code available for payment source | Source: Ambulatory Visit | Attending: Radiation Oncology | Admitting: Radiation Oncology

## 2014-12-28 ENCOUNTER — Encounter: Payer: 59 | Admitting: Nutrition

## 2014-12-28 DIAGNOSIS — Z510 Encounter for antineoplastic radiation therapy: Secondary | ICD-10-CM | POA: Diagnosis not present

## 2014-12-28 NOTE — Progress Notes (Signed)
  Oncology Nurse Navigator Documentation   Navigator Encounter Type: Treatment (12/24/14 1430) Patient Visit Type: Radonc (12/24/14 1430)     To provide support and encouragement, care continuity and to assess for needs, met with George Nielsen after RT. He reported:  Throat soreness controlled with PRN medication.  Dry mouth for which he is drinking water.  Daily application of Biafine.  I encouraged BID - TID application; he understands not to apply 4 hrs prior to tmt. He recognized he has 3 remaining tmts. He did not express any needs or concerns at this time, I encouraged him to contact me if that changes, he verbalized understanding.  Gayleen Orem, RN, BSN, [place] at [place] [phone]                    Time Spent with Patient: 15 (12/24/14 1430)

## 2014-12-29 ENCOUNTER — Ambulatory Visit
Admission: RE | Admit: 2014-12-29 | Discharge: 2014-12-29 | Disposition: A | Discharge status: Home / Self Care | Payer: No Typology Code available for payment source | Source: Ambulatory Visit | Attending: Radiation Oncology | Admitting: Radiation Oncology

## 2014-12-29 DIAGNOSIS — Z510 Encounter for antineoplastic radiation therapy: Secondary | ICD-10-CM | POA: Diagnosis not present

## 2014-12-31 ENCOUNTER — Encounter: Payer: Self-pay | Admitting: Radiation Oncology

## 2014-12-31 NOTE — Progress Notes (Signed)
  Radiation Oncology         [phone] ________________________________  Name: George Nielsen MRN: [id number]  Date: 12/31/2014  DOB: February 22, 1956  End of Treatment Note  Diagnosis:  [id number] Stage IVA Base of Tongue Squamous Cell Carcinoma     Indication for treatment:  Curative with CDDP concurrently      Radiation treatment dates:   11/10/14- 12/29/14  Site/dose:     BOT and bilateral neck / 70 Gy in 35 fractions to gross disease, 63 Gy in 35 fractions to high risk nodal echelons, and 56 Gy in 35 fractions to intermediate risk nodal echelons  Beams/energy:   Helical IMRT / 6 MV photons  Narrative: The patient tolerated radiation treatment relatively well.     Plan: The patient has completed radiation treatment. The patient will return to radiation oncology clinic for routine followup in one half month. I advised the patient to call or return sooner if any questions or concerns arise that are related to recovery or treatment.  -----------------------------------  Eppie Gibson, MD  This document serves as a record of services personally performed by Eppie Gibson, MD. It was created on her behalf by Derek Mound, a trained medical scribe. The creation of this record is based on the scribe's personal observations and the provider's statements to them. This document has been checked and approved by the attending provider.

## 2015-01-10 ENCOUNTER — Telehealth: Payer: Self-pay | Admitting: *Deleted

## 2015-01-10 NOTE — Telephone Encounter (Signed)
  Oncology Nurse Navigator Documentation   Navigator Encounter Type: Other (01/10/15 1332) Patient Visit Type: Follow-up (01/10/15 1332)     Called patient to check on wellbeing since completing RT on 12/29/14. He reported:  Denies significant pain; throat soreness beginning to lessen.  Mouth dry.  Energy returning. I confirmed his understanding of appt with Dr. Isidore Moos on Wed 1120. He did not express any needs or concerns at this time, I encouraged him to contact me if that changes, he verbalized understanding.  Gayleen Orem, RN, BSN, [place] at [place] [phone]                   Time Spent with Patient: 15 (01/10/15 1332)

## 2015-01-11 NOTE — Progress Notes (Signed)
Pain issues, if any: He complains of pain at a 3/10 when swallowing. Using a feeding tube? Yes, Isosource cans. He reports he takes 5 or 6 daily Weight changes, if any:  Wt Readings from Last 3 Encounters:  01/12/15 156 lb 1.6 oz (70.806 kg)  12/27/14 157 lb 11.2 oz (71.532 kg)  12/20/14 160 lb (72.576 kg)    Swallowing issues, if any: He reports he only drinking and taking his pills by mouth. He tries food every few days and is unable to swallow related to decreased saliva, and not tasting good.  Smoking or chewing tobacco: No Using fluoride trays daily: Yes  Last ENT visit was on: He sees [facility]. Edison Nasuti at the [facility]. She removed his trach several weeks ago, which the patient reports having no difficulty with.  He currently has no new appointments scheduled at this time.  Other notable issues, if any:  BP 93/54 mmHg  Pulse 79  Temp(Src) 98.9 F (37.2 C)  Ht 6' (1.829 m)  Wt 156 lb 1.6 oz (70.806 kg)  BMI 21.17 kg/m2  SpO2 100% Orthostatics: Sitting BP was 109/51, pulse 71 and standing BP was 93/54 pulse 79.

## 2015-01-12 ENCOUNTER — Encounter: Payer: Self-pay | Admitting: Radiation Oncology

## 2015-01-12 ENCOUNTER — Encounter: Payer: Self-pay | Admitting: Adult Health

## 2015-01-12 ENCOUNTER — Ambulatory Visit
Admission: RE | Admit: 2015-01-12 | Discharge: 2015-01-12 | Disposition: A | Discharge status: Home / Self Care | Payer: 59 | Source: Ambulatory Visit | Attending: Radiation Oncology | Admitting: Radiation Oncology

## 2015-01-12 VITALS — BP 93/54 | HR 79 | Temp 98.9°F | Ht 72.0 in | Wt 156.1 lb

## 2015-01-12 DIAGNOSIS — C01 Malignant neoplasm of base of tongue: Secondary | ICD-10-CM

## 2015-01-12 NOTE — Progress Notes (Signed)
I briefly met Mr. George Nielsen  today during his visit with Dr. Isidore Moos.  Mr. George Nielsen completed XRT for base of tongue cancer on 12/29/14.  He received 3 cycles of concurrent Cisplatin chemotherapy, under the direction of [facility].   Dr. Isidore Moos spoke with the patient about having subsequent follow-up visits annually with our survivorship team and Mr. George Nielsen  agreed.  I discussed with him the role of survivorship and my role in his cancer care.  I gave him a copy of the "Life After Cancer for Every Survivor" booklet, along with my business card and encouraged him to call me with any questions or concerns.    I will see him in 02/2015 for a Survivorship [place] visit.  He would like a 12:30pm appt time for this. [name], Nurse Navigator and I will work with him to get this scheduled.  Per Mr. George Nielsen, he will have his re-staging scans through the [place] sometime in January or February 2017.  I asked Mr. George Nielsen to call [name] or I when those scans are scheduled so that we can coordinate his subsequent follow-up with Dr. Isidore Moos and acquire the CDs/imaging/reports before that visit.  He agreed.   I look forward to participating in his care.    Mike Craze, NP [place] [phone]

## 2015-01-12 NOTE — Progress Notes (Signed)
Radiation Oncology         [phone]) [phone] ________________________________  Name: George Nielsen MRN: [id number]  Date: 01/12/2015  DOB: 03-27-1955  Follow-Up Visit Note  CC: TODD,JEFFREY Zenia Resides, MD  Berneice Heinrich, MD  Diagnosis and Prior Radiotherapy:       ICD-9-CM ICD-10-CM   1. 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  "I am healing fast" Pain issues, if any: He complains of pain at a 3/10 when swallowing Using a feeding tube? Yes, Isosource cans.  He reports he takes 5 or 6 daily Weight changes, if any: in physical exam Swallowing issues, if any: He reports he only drinking and taking his pills by mouth. He tries food every few days and is unable to swallow related to decreased saliva, and not tasting good.  Smoking or chewing tobacco: No  Using fluoride trays daily: Yes  Last ENT visit was on: He sees Dr. Edison Nasuti at the [facility]. She removed his trach several weeks ago, which the patient reports having no difficulty with. He currently has no new appointments scheduled at this time.   [facility] or February of next year. Other notable issues, if any: He is eager to begin total nutrition by mouth.            ALLERGIES:  is allergic to bee venom; vicodin; and poison ivy extract.  Meds: Current Outpatient Prescriptions  Medication Sig Dispense Refill  . albuterol (PROVENTIL HFA;VENTOLIN HFA) 108 (90 BASE) MCG/ACT inhaler Inhale 2 puffs into the lungs every 6 (six) hours as needed for  wheezing or shortness of breath.    Marland Kitchen amLODipine (NORVASC) 10 MG tablet Take 10 mg by mouth daily.    Marland Kitchen buPROPion (WELLBUTRIN SR) 150 MG 12 hr tablet TAKE 1 TABLET BY MOUTH DAILY FOR 3 DAYS. THEN TAKE 1 TAB TWICE A DAY  3  . fentaNYL (DURAGESIC - DOSED MCG/HR) 25 MCG/HR patch Place 1 patch (25 mcg total) onto the skin every 3 (three) days. 1 patch 0  . lisinopril (PRINIVIL,ZESTRIL) 20 MG tablet Take 20 mg by mouth daily.    Marland Kitchen LORazepam (ATIVAN) 0.5 MG tablet Take 1-2 tablets 30 minutes prior to radiotherapy to help with anxiety 35 tablet 0  . nitroGLYCERIN (NITROSTAT) 0.4 MG SL tablet Place 1 tablet (0.4 mg total) under the tongue every 5 (five) minutes as needed for chest pain (do not take more than 3 tablets in a row). 30 tablet 0  . ondansetron (ZOFRAN) 8 MG tablet Take by mouth every 8 (eight) hours as needed for nausea or vomiting.    . docusate sodium (COLACE) 100 MG capsule Take 100 mg by mouth 2 (two) times daily.    Marland Kitchen HYDROcodone-acetaminophen (HYCET) 7.5-325 mg/15 ml solution Take 15 mLs by mouth every 4 (four) hours as needed for moderate pain.    Marland Kitchen lidocaine (XYLOCAINE) 2 % solution Mix 1 part 2%viscous lidocaine,1part H2O.Swish and/or  swallow 32ml of this mixture,61min before meals and at bedtime, up to QID (Patient not taking: Reported on 12/27/2014) 100 mL 5  . Magnesium 100 MG TABS Take 50 mg by mouth daily.    Marland Kitchen oxycodone (OXY-IR) 5 MG capsule Take 5 mg by mouth every 6 (six) hours as needed.    Marland Kitchen POTASSIUM PO Take 1 tablet by mouth daily.    . sucralfate (CARAFATE) 1 G tablet Dissolve 1 tablet in 41ml H2O and swallow up to QID,PRN sore throat. (Patient not taking: Reported on 12/20/2014) 60 tablet 5   No current facility-administered medications for this encounter.    Physical Findings: The patient is in no acute distress. Patient is alert and oriented. Wt Readings from Last 3 Encounters:  01/12/15 156 lb 1.6 oz (70.806 kg)  12/27/14 157 lb 11.2 oz (71.532 kg)  12/20/14 160 lb  (72.576 kg)    height is 6' (1.829 m) and weight is 156 lb 1.6 oz (70.806 kg). His temperature is 98.9 F (37.2 C). His blood pressure is 93/54 and his pulse is 79. His oxygen saturation is 100%. . Not orthostatic. General: Alert and oriented, in no acute distress HEENT: Head is normocephalic. Extraocular movements are intact. Oropharynx is notable for slightly erythematous with no lesions.  Mucous membranes are moist Neck: Neck is notable for no cervical or supraclavicular masses Skin: Skin in treatment fields shows satisfactory healing intact  Lab Findings: Lab Results  Component Value Date   WBC 8.5 11/01/2014   HGB 14.5 11/01/2014   HCT 41.9 11/01/2014   MCV 92.6 11/01/2014   PLT 224 11/01/2014    Lab Results  Component Value Date   TSH 0.705 11/01/2014    Radiographic Findings: No results found.  Impression/Plan:    1) Head and Neck Cancer Status: healing well  2) Nutritional Status: weight relatively stable PEG tube: still using, will push PO  3) Risk Factors: The patient has been educated about risk factors including alcohol and tobacco abuse; they understand that avoidance of alcohol and tobacco is important to prevent recurrences as well as other cancers  4) Swallowing: ok with soft foods  5) Dental: Encouraged to continue regular followup with dentistry, and dental hygiene including fluoride rinses.    6) Thyroid function: check 6-12 months  Lab Results  Component Value Date   TSH 0.705 11/01/2014    7) Other:  Follow up with med/onc at [facility] in Nov, meeting Mike Craze H+N survivorship specialist today.  8) Follow-up in 2 months with Mike Craze, NP of survivorship at [facility] clinic. May see nutrition, SW, PT at that time. The patient was encouraged to call with any issues or questions before then. Elzie Rings will arrange followup with me after restaging scans done at the  [facility]    This document serves as a record of services personally performed by  Eppie Gibson, MD. It was created on her behalf by Janace Hoard, a trained medical scribe. The creation of this record is based on the scribe's personal observations and the provider's statements to them. This document has been checked and approved by the attending provider.  _____________________________________   Eppie Gibson, MD

## 2015-01-13 ENCOUNTER — Telehealth: Payer: Self-pay | Admitting: *Deleted

## 2015-01-13 ENCOUNTER — Encounter: Payer: Self-pay | Admitting: *Deleted

## 2015-01-13 NOTE — Telephone Encounter (Signed)
  Oncology Nurse Navigator Documentation   Navigator Encounter Type: Telephone (01/13/15 1125) Patient Visit Type: Follow-up (01/13/15 1125)     Called patient with post-tmt dental questions. He reported:  Using fluoride trays 3-4 times daily per [facility] dentist Dr. Hardin Negus' guidance.  I encouraged him to contact Dr. Hardin Negus to ask about reducing frequency to daily at bedtime, protocol of [place].  Saw community dentist this week for follow-up care. I recognized his proactive dental activities, encouraged him to continue.  Gayleen Orem, RN, BSN, [place] at [place] [phone]                      Time Spent with Patient: 15 (01/13/15 1125)

## 2015-01-13 NOTE — Progress Notes (Signed)
  Oncology Nurse Navigator Documentation   Navigator Encounter Type: Other (01/12/15 1130) Patient Visit Type: Radonc;Follow-up (01/12/15 1130)     To provide support and encouragement, care continuity and to assess for needs, met with patient during 2 week post-tmt f/u with Dr. Isidore Moos. He reported:  Minimal throat pain.  Dry mouth, drinking lots of water.  Trying some foods by mouth, sense of taste slowly returning.  I encouraged him to experiment with variety of foods.    Instilling 5-6 cans nutritional supplement and water daily via PEG.  We discussed goal of 100% oral intake with several weeks of stable weight before PEG could be removed.  Seeing Dr. Brigitte Pulse, medical oncologist, in early November. I provided him roll of Medipore tape for PEG dsg. He did not express any further needs or concerns at this time, I encouraged him to contact me if that changes, he verbalized understanding.  Gayleen Orem, RN, BSN, [place] at [place] [phone]                     Time Spent with Patient: 45 (01/12/15 1130)

## 2015-02-09 ENCOUNTER — Ambulatory Visit: Payer: Self-pay | Admitting: Radiation Oncology

## 2015-03-08 ENCOUNTER — Telehealth: Payer: Self-pay | Admitting: *Deleted

## 2015-03-08 NOTE — Telephone Encounter (Signed)
  Oncology Nurse Navigator Documentation   Navigator Encounter Type: Telephone (03/08/15 1045)     Patient called to apologize for missed 1-m post RT f/u appt with Dr. Isidore Moos on 02/09/15, would like to reschedule.  Indicated morning appts preferred.  I notified Earleen Newport, Radiation Scheduling.  Gayleen Orem, RN, BSN, [place] at [place] [phone]                     Time Spent with Patient: 15 (03/08/15 1045)

## 2015-03-30 ENCOUNTER — Telehealth: Payer: Self-pay | Admitting: Adult Health

## 2015-03-30 NOTE — Telephone Encounter (Signed)
I called George Nielsen to follow-up at the request of Dr. Isidore Moos re: [facility].  He tells me that his PET scan is scheduled for 05/10/15 at the [facility].  He remains under the care of Dr. Manuella Ghazi and Dr. Edison Nasuti [phone], [phone] or [phone] but will receive the scan in [place].  George Nielsen will receive a copy of the PET images on a disc before leaving.  I will work with Gayleen Orem, RN-H&N Navigator & Dr. Eugenia Pancoast office to coordinate [place] getting a copy of both the images and the radiology report.   I will follow-up with Dr. Isidore Moos re: when she would like to see the patient after the completion of his PET scan.    George Craze, NP [place] [phone]

## 2015-04-05 ENCOUNTER — Telehealth: Payer: Self-pay | Admitting: Adult Health

## 2015-04-05 NOTE — Telephone Encounter (Signed)
I spoke with Maryagnes Amos, RN-Oncology Navigator at [facility] [phone], [phone]) to discuss how to coordinate getting a copy of George Nielsen PET scan images on disc, as well as the report so that Dr. Isidore Moos may review them here at the cancer center.  George Nielsen is scheduled for his PET scan at the [facility] on 05/10/15.   George Nielsen tells me that the patient must sign a release of information to have the discs/reports sent to [place] here at the cancer center.  She tells me that George Nielsen is going to be coming to see them towards the end of this month, and she will give the patient a copy of the form for him to sign and bring with him to [place] to release his medical information to [place].  I also shared with her my direct phone number, fax number, and our cancer center address.  The PET discs will be mailed by USPS.  The report can be faxed to [place] after it is available, per George Nielsen.  I thanked her for her help and encouraged her to call us with any questions or concerns in the meantime for this patient.    Mike Craze, NP [place] [phone]

## 2015-04-11 ENCOUNTER — Telehealth: Payer: Self-pay | Admitting: Adult Health

## 2015-04-11 NOTE — Telephone Encounter (Signed)
I called to let Mr. Zafar know that I am working with his [employer], Butch Penny, to make arrangements for [place] to get copies of his PET scan images/reports after the test is completed in February.  Mr. Recine will see his Medical Oncologist at the [facility] next week for a routine follow-up visit.  Therefore, at the direction of Dr. Isidore Moos, I am going to cancel his appt with Dr. Isidore Moos for this Wednesday, and we will reschedule a visit for him to see Dr. Isidore Moos sometime after his PET scan is completed and we have received the images/report from the [place].  Mr. Tulk voiced verbal understanding of this plan and agrees with it. He knows to call me or Gayleen Orem, RN-H&N Navigator if he has any additional questions or concerns.   I am forwarding this message to Dr. Stark Bray, and Dr. Pearlie Oyster RN to make them aware.    Mike Craze, NP [place] [phone]

## 2015-04-13 ENCOUNTER — Ambulatory Visit: Admission: RE | Admit: 2015-04-13 | Payer: 59 | Source: Ambulatory Visit | Admitting: Radiation Oncology

## 2015-05-05 ENCOUNTER — Telehealth: Payer: Self-pay | Admitting: *Deleted

## 2015-05-05 NOTE — Telephone Encounter (Addendum)
  Oncology Nurse Navigator Documentation  Navigator Location: [facility]-Med Onc (05/05/15 0944) Navigator Encounter Type: Telephone (05/05/15 0944) Telephone: Outgoing Call;Patient Update (05/05/15 0944)             Barriers/Navigation Needs: No barriers at this time (05/05/15 0944)     Called Mr. George Nielsen to check on his well-being.  He completed RT on 12/29/14. He reported:  Still using PEG, instilling 4 cans of nutritional supplement daily. He is eating solid food for his evening meal.    Gaining weight.  Currently 161 lbs, up from 154 before Christmas.  Sense of taste is gradually returning; for example, he can taste sweets but not salt.  I educated that it may take an additional 6 months for taste buds to fully recover.  He is taking Aileen Fass' to enhance his sense of taste before meals, finds it to be somewhat beneficial.  Dry mouth, drinking lots of water.  I educated he can expect dry mouth to be a permanent SE of RT.  Denies throat pain, difficulty swallowing.  Has a "knot" under his chin that "started out soft and moved around"  but know is "hard" and stationary. His ENT and medical oncologist are aware and not concerned.   I educated on development and tmt of post-RT lymphedema; he doesn't think the swelling fits the description.  Has post-tmt PET scheduled for next week, 2/14.  He stated he will bring a copy of the scan and report. He understands he can contact me or Mike Craze, NP, Survivorship, with needs/concerns.  Gayleen Orem, RN, BSN, [place] at [place] [phone]                             Time Spent with Patient: 30 (05/05/15 0944)

## 2015-05-12 ENCOUNTER — Telehealth: Payer: Self-pay | Admitting: Adult Health

## 2015-05-12 NOTE — Telephone Encounter (Signed)
I spoke with Mr. Lord to follow-up after her PET scan he completed yesterday at the [facility].  He tells me that he gave them a copy of the medical record release form to have his images/report sent to [place] here at the cancer center.    He has an appt to see his medical oncologist today, 05/12/15.  He is seeing his ENT physician tomorrow, 05/13/15.  I will follow-up with Dr. Isidore Moos to see when she would like to see the patient, given he has close follow-up with his [facility] oncology team and we are waiting to receive his PET scan discs and report.  I let him know that someone from the radiation oncology office would give him a call to get an appt with Dr. Isidore Moos, based on her guidance of when she would like to see him.  He voiced understanding and agreed to this plan.  I encouraged him to call me with any other needs or concerns.   Mike Craze, NP [place] [phone]

## 2015-05-19 ENCOUNTER — Encounter: Payer: Self-pay | Admitting: Adult Health

## 2015-05-19 NOTE — Progress Notes (Signed)
Reports and diagnostic imaging discs received today from recent PET/CT completed at [facility] on 05/10/15.  Given to Dr. Isidore Moos for her review.    Mike Craze, NP [place] [phone]

## 2015-05-25 ENCOUNTER — Telehealth: Payer: Self-pay | Admitting: *Deleted

## 2015-05-25 ENCOUNTER — Other Ambulatory Visit: Payer: Self-pay | Admitting: Radiation Oncology

## 2015-05-25 ENCOUNTER — Encounter: Payer: Self-pay | Admitting: Radiation Oncology

## 2015-05-25 NOTE — Telephone Encounter (Signed)
CALLED PATIENT TO INFORM OF FU WITH DR. Isidore Moos ON 07-08-15 @ 2:50 PM, PATIENT DECLINED THIS APPT., HE REQUESTED AN EARLIER APPT. PT. SCHEDULED FOR 06-29-15 @ 11 AM , PATIENT AGREED TO THIS DATE AND TIME.

## 2015-05-25 NOTE — Progress Notes (Signed)
I looked at the PET imaging from the [facility] and he appears to have a complete response to treatment. I sent an order for a f/u to be scheduled w/ me in 6 wks. I've asked for his CT to be digitized so I can obtain the PET images via EPIC imaging in the future. -----------------------------------  Eppie Gibson, MD

## 2015-06-09 ENCOUNTER — Inpatient Hospital Stay
Admission: RE | Admit: 2015-06-09 | Discharge: 2015-06-09 | Disposition: A | Discharge status: Home / Self Care | Payer: Self-pay | Source: Ambulatory Visit | Attending: Radiation Oncology | Admitting: Radiation Oncology

## 2015-06-09 ENCOUNTER — Other Ambulatory Visit: Payer: Self-pay | Admitting: Radiation Oncology

## 2015-06-09 DIAGNOSIS — C801 Malignant (primary) neoplasm, unspecified: Secondary | ICD-10-CM

## 2015-06-21 NOTE — Progress Notes (Addendum)
George Nielsen presents for follow up of radiation completed 12/29/14 to his Base of Tongue and Bilateral Neck.  BP 115/53 mmHg  Pulse 73  Temp(Src) 97.7 F (36.5 C)  Ht 6' (1.829 m)  Wt 162 lb 6.4 oz (73.664 kg)  BMI 22.02 kg/m2 - Sitting BP 106/58 mmHg  Pulse 78  Temp(Src) 97.7 F (36.5 C)  Ht 6' (1.829 m)  Wt 162 lb 6.4 oz (73.664 kg)  BMI 22.02 kg/m2  SpO2 100%- Standing Denies any improvement in fatigue at this time.   Oral mucosa pink and intact.  Brownish area on tongue.   Pain issues, if any:  Using a feeding tube?: Feeding Tube with 2-4 cans of unknown supplement Weight changes, if any: Gained 6 lbs since 01/12/15 Swallowing issues, if any: Dry mouth decreases swallowing ability. Reports lack of taste of food.  Food "gets stuck in his throat" Smoking or chewing tobacco? No, Nicotine patch Using fluoride trays daily? Yes,TID Last ENT visit was on: 05/13/15 Dr. Silvestre Moment (Notes from [place] in papers) Other notable issues, if any:  PET 05/10/15

## 2015-06-29 ENCOUNTER — Ambulatory Visit
Admission: RE | Admit: 2015-06-29 | Discharge: 2015-06-29 | Disposition: A | Discharge status: Home / Self Care | Payer: Non-veteran care | Source: Ambulatory Visit | Attending: Radiation Oncology | Admitting: Radiation Oncology

## 2015-06-29 ENCOUNTER — Encounter: Payer: Self-pay | Admitting: Radiation Oncology

## 2015-06-29 ENCOUNTER — Encounter: Payer: Self-pay | Admitting: *Deleted

## 2015-06-29 VITALS — BP 106/58 | HR 78 | Temp 97.7°F | Ht 72.0 in | Wt 162.4 lb

## 2015-06-29 DIAGNOSIS — C01 Malignant neoplasm of base of tongue: Secondary | ICD-10-CM

## 2015-06-29 NOTE — Progress Notes (Signed)
  Oncology Nurse Navigator Documentation  Navigator Location: [facility] On[facility] (06/29/15 1120) Navigator Encounter Type: Follow-up Appt (06/29/15 1120)   Abnormal Finding Date: 09/30/14 (06/29/15 1120) Confirmed Diagnosis Date: 09/30/14 (06/29/15 1120)   Treatment Initiated Date: 11/10/14 (06/29/15 1120) Patient Visit Type: ELFYBO (06/29/15 1120) Treatment Phase: Post-Tx Follow-up (06/29/15 1120) Barriers/Navigation Needs: Coordination of Care (06/29/15 1120)   Interventions: Coordination of Care (06/29/15 1120)   Coordination of Care: Appts (with [facility]) (06/29/15 1120)     Met with George Nielsen during post-tmt follow-up appt with Dr. Isidore Moos.  He completed [place] RT 12/29/14.  He reports:  Resolved ropey saliva but persistently thick, xerostomia.  Drinking water throughout day.    Full sense of taste yet to return.  Can taste sweets and citrus.  Most other foods bland, cannot taste salt.  I educated it can take up to 12 months for sense of taste to fully return.  Reduced energy persists.  He recognizes he is not active, not exercising.  I encouraged increased activity to build stamina.  I educated it may take up to 12 months for baseline energy level to return, can improve more rapidly if he exercises.  He voiced understanding.  Denies pain, not taking any medications for pain.  Using fluoride trays TID.  I encouraged that daily at bedtime is recommended by Dr. Enrique Sack, [facility].  [facility] medical [facility] for routine follow-up.  Using PEG for instillation of 2 cans supplement daily, free water flushes.  Food is difficult to swallow because of mouth dryness.  "Gets stuck."    He voiced understanding of Dr. Pearlie Oyster recommendation for further swallowing evaluation including OP SLP and PT lymphedema, MBSS, TSH check now and again in 6 months.  He noted he will request the TSH monitoring when he sees his MedOnc tomorrow.  He understands I will contact [place] for referral  guidance.  He voiced understanding he will see Mike Craze, NP, for Survivorship in 5 months.  He understands to call me with questions/concerns.  Gayleen Orem, RN, BSN, [place] at [place] [phone]                 Time Spent with Patient: 75 (06/29/15 1120)

## 2015-06-29 NOTE — Progress Notes (Deleted)
BP 115/53 mmHg  Pulse 73  Temp(Src) 97.7 F (36.5 C)  Ht 6' (1.829 m)  Wt 162 lb 6.4 oz (73.664 kg)  BMI 22.02 kg/m2

## 2015-06-29 NOTE — Progress Notes (Signed)
Radiation Oncology         [phone]) [phone] ________________________________  Name: George Nielsen MRN: [id number]  Date: 06/29/2015  DOB: 1955/04/27  Follow-Up Visit Note  CC: TODD,JEFFREY Zenia Resides, MD  Berneice Heinrich, MD  Diagnosis and Prior Radiotherapy:       ICD-9-CM ICD-10-CM   1. Malignant neoplasm of base of tongue ([place]) 141.0 C01      Diagnosis:  T3N2cM0 Stage IVA Base of Tongue Squamous Cell Carcinoma     Indication for treatment:  Curative with CDDP concurrently      Radiation treatment dates:   11/10/14- 12/29/14  Site/dose:     BOT and bilateral neck / 70 Gy in 35 fractions to gross disease, 63 Gy in 35 fractions to high risk nodal echelons, and 56 Gy in 35 fractions to intermediate risk nodal echelons  Narrative:  The patient returns today for routine follow-up. I looked at the PET imaging from the [facility] and he appears to have a complete response to treatment. There is some residual hypermetabolic activity that is mild and likely treatment related. He reports that as a precaution, Dr Edison Nasuti ordered a PET for end of May to followup.  He Pain issues, if any: none stated Using a feeding tube?: Feeding Tube with 2-4 cans of unknown supplement Weight changes, if any: Gained 6 lbs since 01/12/15 Swallowing issues, if any: Dry mouth decreases swallowing ability. Reports lack of taste of food.  Food "gets stuck in his throat" Smoking or chewing tobacco? No, Nicotine patch Using fluoride trays daily? Yes,TID Last ENT visit was on: 05/13/15 Dr. Silvestre Moment    ALLERGIES:  is allergic to bee venom; vicodin; and poison ivy extract.  Meds: Current Outpatient Prescriptions  Medication Sig Dispense Refill  . amLODipine (NORVASC) 10 MG tablet Take 10 mg by mouth daily.    Marland Kitchen buPROPion (WELLBUTRIN SR) 150 MG 12 hr tablet TAKE 1 TABLET BY MOUTH DAILY FOR 3 DAYS. THEN TAKE 1 TAB TWICE A DAY  3  . lisinopril (PRINIVIL,ZESTRIL) 20 MG tablet Take 20 mg by mouth daily.    . nitroGLYCERIN  (NITROSTAT) 0.4 MG SL tablet Place 1 tablet (0.4 mg total) under the tongue every 5 (five) minutes as needed for chest pain (do not take more than 3 tablets in a row). 30 tablet 0  . albuterol (PROVENTIL HFA;VENTOLIN HFA) 108 (90 BASE) MCG/ACT inhaler Inhale 2 puffs into the lungs every 6 (six) hours as needed for wheezing or shortness of breath. Reported on 06/29/2015    . fentaNYL (DURAGESIC - DOSED MCG/HR) 25 MCG/HR patch Place 1 patch (25 mcg total) onto the skin every 3 (three) days. (Patient not taking: Reported on 06/29/2015) 1 patch 0  . LORazepam (ATIVAN) 0.5 MG tablet Take 1-2 tablets 30 minutes prior to radiotherapy to help with anxiety 35 tablet 0   No current facility-administered medications for this encounter.    Physical Findings: The patient is in no acute distress. Patient is alert and oriented. Wt Readings from Last 3 Encounters:  06/29/15 162 lb 6.4 oz (73.664 kg)  01/12/15 156 lb 1.6 oz (70.806 kg)  12/27/14 157 lb 11.2 oz (71.532 kg)    height is 6' (1.829 m) and weight is 162 lb 6.4 oz (73.664 kg). His temperature is 97.7 F (36.5 C). His blood pressure is 106/58 and his pulse is 78. His oxygen saturation is 100%. .   General: Alert and oriented, in no acute distress HEENT: Head is normocephalic. Extraocular movements are  intact. Oropharynx is  with no lesions.    Neck: Neck is notable for no cervical or supraclavicular masses. + lymphedema Skin: Skin in treatment fields shows satisfactory healing intact HEART RRR CHEST CTAB PEG site clean/ intact over abdomen  Lab Findings: Lab Results  Component Value Date   WBC 8.5 11/01/2014   HGB 14.5 11/01/2014   HCT 41.9 11/01/2014   MCV 92.6 11/01/2014   PLT 224 11/01/2014    Lab Results  Component Value Date   TSH 0.705 11/01/2014    Radiographic Findings: No results found.  Impression/Plan:    1) Head and Neck Cancer Status: NED  2) Nutritional Status: weight relatively stable to increased PEG tube: still  using, will push PO  3) Risk Factors: The patient has been educated about risk factors including alcohol and tobacco abuse; they understand that avoidance of alcohol and tobacco is important to prevent recurrences as well as other cancers  4) Swallowing: difficult due to lack of saliva - cannot rule out stricture. Try artificial saliva gel. Add sauces to food. See # 8 re: SLP and MBSS  5) Dental: Encouraged to continue regular followup with dentistry, and dental hygiene including fluoride rinses.    6) Thyroid function: check 6-12 months - told pt to discuss this with his med/onc so labs are done via [facility]  Lab Results  Component Value Date   TSH 0.705 11/01/2014    7) Other:  followup w/ Mike Craze H+N survivorship specialist in 5 mo; he'll see me again thereafter   8) Follow-up with med onc and ENT at [facility] as scheduled. Gayleen Orem, RN, our Head and Neck Oncology Navigator will talk to [facility] about eligibility for MBSS, SLP consult for dysphagia.  Also, he will check if eligible for PT consult for lymphedema of neck.   This document serves as a record of services personally performed by Eppie Gibson, MD. It was created on her behalf by Janace Hoard, a trained medical scribe. The creation of this record is based on the scribe's personal observations and the provider's statements to them. This document has been checked and approved by the attending provider.  _____________________________________   Eppie Gibson, MD

## 2015-07-01 ENCOUNTER — Telehealth: Payer: Self-pay | Admitting: *Deleted

## 2015-07-01 ENCOUNTER — Other Ambulatory Visit: Payer: Self-pay | Admitting: Radiation Oncology

## 2015-07-01 DIAGNOSIS — R635 Abnormal weight gain: Secondary | ICD-10-CM

## 2015-07-01 DIAGNOSIS — C760 Malignant neoplasm of head, face and neck: Secondary | ICD-10-CM

## 2015-07-01 NOTE — Telephone Encounter (Signed)
Patient wants another day with Garald Balding, appt. Has been moved to 5- 1-17 - arrival time- 1 pm, spoke with patient and he is aware of this appt.

## 2015-07-01 NOTE — Telephone Encounter (Signed)
Called patient to inform of PT appt. For 07-07-15- arrival time - 1:30 pm @ [facility] and his speech path. Appt. With Garald Balding on 07-11-15 @ 3:30 pm, spoke with patient and he is aware of these appts.

## 2015-07-05 ENCOUNTER — Telehealth: Payer: Self-pay | Admitting: Adult Health

## 2015-07-05 NOTE — Telephone Encounter (Signed)
I attempted to reach Mr. Engelhard to make him aware of an appt with me in 11/2015, as requested by Dr. Isidore Moos.  (he does not need labs; the [facility] manages his TSH)  I have made this appt and let him know that I would mail him a copy of his appt calendar.  I encouraged him to call me with any questions or concerns and I would be happy to see him sooner if needed.   Mike Craze, NP [place] [phone]

## 2015-07-06 ENCOUNTER — Telehealth: Payer: Self-pay | Admitting: *Deleted

## 2015-07-06 NOTE — Telephone Encounter (Signed)
Called patient to inform that PT appt. Has been moved to 07-15-15 - arrival time - 9:15 am @ [facility], due to form from [place], lvm for a return call

## 2015-07-07 ENCOUNTER — Ambulatory Visit: Payer: No Typology Code available for payment source | Admitting: Physical Therapy

## 2015-07-07 ENCOUNTER — Telehealth: Payer: Self-pay | Admitting: *Deleted

## 2015-07-07 NOTE — Telephone Encounter (Signed)
  Oncology Nurse Navigator Documentation  Navigator Location: [facility] (07/07/15 [id number]) Navigator Encounter Type: Letter/Fax/Email (07/07/15 [id number])      Faxed copy of 05/12/2015 labs received from [facility] indicating TSH of 2.03.  Report scanned, available in Chart Review, Labs.  Dr. Isidore Moos notified of updated value.   Gayleen Orem, RN, BSN, [place] at [place] [phone]                                       Time Spent with Patient: 15 (07/07/15 0925)

## 2015-07-07 NOTE — Telephone Encounter (Signed)
  Oncology Nurse Navigator Documentation  Navigator Location: [facility] (07/06/15 1107) Navigator Encounter Type: Telephone (07/06/15 1107) Telephone: Outgoing Call (07/06/15 1107)                 Interventions: Coordination of Care (07/06/15 1107)       [place] to check on status of authorization for PT lymphedema referral. LVMM asking for return call.  Gayleen Orem, RN, BSN, [place] at [place] [phone]                   Time Spent with Patient: 15 (07/06/15 1107)

## 2015-07-08 ENCOUNTER — Ambulatory Visit: Payer: Self-pay | Admitting: Radiation Oncology

## 2015-07-11 ENCOUNTER — Ambulatory Visit: Payer: Non-veteran care

## 2015-07-14 ENCOUNTER — Telehealth: Payer: Self-pay | Admitting: *Deleted

## 2015-07-14 NOTE — Telephone Encounter (Signed)
  Oncology Nurse Navigator Documentation  Navigator Location: [facility] (07/14/15 1157) Navigator Encounter Type: Telephone (07/14/15 1157) Telephone: Outgoing Call (07/14/15 1157)                     Coordination of Care: Other (07/14/15 1157)        Called again Lieutenant Diego, [place] [phone]), re: approval for PT lymphedema referral.  LVMM requesting call back.  Gayleen Orem, RN, BSN, [place] at [place] [phone]              Time Spent with Patient: 15 (07/14/15 1157)

## 2015-07-15 ENCOUNTER — Ambulatory Visit: Payer: No Typology Code available for payment source | Attending: Radiation Oncology | Admitting: Physical Therapy

## 2015-07-15 ENCOUNTER — Encounter: Payer: Self-pay | Admitting: Physical Therapy

## 2015-07-15 DIAGNOSIS — I890 Lymphedema, not elsewhere classified: Secondary | ICD-10-CM | POA: Diagnosis not present

## 2015-07-15 DIAGNOSIS — M6281 Muscle weakness (generalized): Secondary | ICD-10-CM | POA: Insufficient documentation

## 2015-07-15 NOTE — Therapy (Signed)
[place], [place], [address] Phone: [phone]   Fax:  [phone]  Physical Therapy Evaluation  Patient Details  Name: George Nielsen MRN: [id number] Date of Birth: March 25, 1956 Referring Provider: Dr. Isidore Moos  Encounter Date: 07/15/2015      PT End of Session - 07/15/15 1222    Visit Number 1   Number of Visits 25   Date for PT Re-Evaluation 09/09/15   PT Start Time 0940   PT Stop Time 1015   PT Time Calculation (min) 35 min   Activity Tolerance Patient tolerated treatment well   Behavior During Therapy [facility] for tasks assessed/performed      Past Medical History  Diagnosis Date  . Allergy   . Hypertension   . GERD (gastroesophageal reflux disease)   . Ulcer   . Hepatitis C   . COPD (chronic obstructive pulmonary disease) ([place])   . PNA (pneumonia)     Past Surgical History  Procedure Laterality Date  . Tonsillectomy    . Back surgery    . Shoulder surgery      There were no vitals filed for this visit.       Subjective Assessment - 07/15/15 0944    Subjective I noticed swelling during the treatments. It wasn't bad then it was kind of like a knot. When I wake up it seems the knot is close to my chin and once I have been up for a while it would move down my neck. As time went on it just kind of grew in to a big knot.    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 No/denies  pt reports it is uncomfortable            [facility] PT Assessment - 07/15/15 0001    Assessment   Medical Diagnosis base of tongue cancer   Referring Provider Dr. Isidore Moos   Onset Date/Surgical Date 09/30/14  biopsy of back of tongue   Hand Dominance Right   Prior Therapy  none   Precautions   Precautions Other (comment)  lymphedema   Restrictions   Weight Bearing Restrictions No   Balance Screen   Has the patient fallen in the past 6 months No  pt reports his right leg drags due to nerve damage in back   Has the patient had a decrease in activity level because of a fear of falling?  No   Is the patient reluctant to leave their home because of a fear of falling?  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, stooping, reaching   Leisure pt reports he exercises every other day for approx 20-30 min this includes walking   Cognition   Overall Cognitive Status Within Functional Limits for tasks assessed  Strength   Overall Strength Other (comment)  LLE gross 5/5, RLE 4/5 secondary to back injury, BUE 5/5           LYMPHEDEMA/ONCOLOGY QUESTIONNAIRE - 07/15/15 0956    Type   Cancer Type base of tongue   Surgeries   Other Surgery Date 09/30/14  base of tongue biopsy, trach   Date Lymphedema/Swelling Started   Date 11/25/14   Treatment   Active Chemotherapy Treatment No   Past Chemotherapy Treatment Yes   Date 11/25/14   Active Radiation Treatment No   Past Radiation Treatment Yes   Date 12/25/14   Body Site neck   Current Hormone Treatment No   Past Hormone Therapy No   What other symptoms do you have   Are you Having Heaviness or Tightness Yes   Are you having Pain No  discomfort   Are you having pitting edema No   Is it Hard or Difficult finding clothes that fit No   Do you have infections No   Head and  Neck   Right Corner of mouth to where ear lobe meets face 10 cm   Left Corner of mouth to where ear lobe meets face 9.7 cm   4 cm superior to sternal notch around neck 40.5 cm   6 cm superior to sternal notch around neck 42 cm   8 cm superior to sternal notch around neck 45.5 cm                           Short Term Clinic Goals - 07/15/15 1236    CC Short Term Goal  #1   Title Pt to receive trial of FlexiTouch compression head and neck garment for long term management of edema   Baseline na   Time 4   Period Weeks   Status New   CC Short Term Goal  #2   Title Pt to demonstrate a 0.5 cm decrease in circumferential measurements 8 cm up from sternal notch to improve comfort   Baseline 45.5   Time 4   Period Weeks   Status New   CC Short Term Goal  #3   Title Pt to demonstrate a 0.5 cm decrease in circumferential measurements 6 cm up from sternal notch to improve comfort   Baseline 42   Time 4   Period Weeks   Status New             Long Term Clinic Goals - 07/15/15 1237    CC Long Term Goal  #1   Title Pt to be independent in self manual lymphatic drainage for long term management of edema   Baseline na   Time 8   Period Weeks   Status New   CC Long Term Goal  #2   Title Pt to receive appropriate compression garment for long term management of edema   Baseline na   Time 8   Period Weeks   Status New   CC Long Term Goal  #3   Title Pt to report at least a 50% improvement in anterior neck fibrosis for improved comfort   Time 8   Period Weeks   Status New   CC Long Term Goal  #4   Title Pt to demonstrate a 1 cm decrease in circumferential measurements 8 cm up from sternal notch to improve comfort   Baseline 45.5   Time 8   Period Weeks   Status New  CC Long Term Goal  #5   Title Pt to be independent in a home exercise program to promote lymphatic drainage and improve cervical flexibility   Time 8   Period Weeks   Status New             Plan - 07/15/15 1223    Clinical Impression Statement Pt with history of base of tongue cancer and has completed chemotherapy and radiation. He has been having swelling in his anterior neck since the fall of last year. Palpation reveals increased fibrosis of this area. He states it is very uncomfortable and he also has dificulty swallowing. Pt would benefit from skilled PT services for complete decongestive therapy of head and neck to help decrease edema and to find appropriate compression garment for pt. He would also benefit from trial of FlexiTouch head and neck garment and a referral was made for that today. Pt also has RLE weakness from a past back injury but at this point the focus will remain on lymphedema.   Rehab Potential Good   PT Frequency 3x / week   PT Duration 8 weeks   PT Treatment/Interventions Vasopneumatic Device;Manual techniques;Manual lymph drainage;Therapeutic exercise;Taping;Passive range of motion;Compression bandaging;ADLs/Self Care Home Management   PT Next Visit Plan assess cervical ROM, begin MLD and teach self MLD, chip pak for anterior neck   Recommended Other Services FlexiTouch for head and neck pump   Consulted and Agree with Plan of Care Patient      Patient will benefit from skilled therapeutic intervention in order to improve the following deficits and impairments:  Decreased range of motion, Pain, Increased edema, Increased fascial restricitons  Visit Diagnosis: Lymphedema, not elsewhere classified - Plan: PT plan of care cert/re-cert  Muscle weakness (generalized) - Plan: PT plan of care cert/re-cert     Problem List Patient Active Problem List   Diagnosis Date Noted  . Malignant neoplasm of base of tongue ([place]) 10/27/2014  . Chronic kidney disease   . COPD (chronic obstructive pulmonary disease) ([place]) 07/10/2012  . Chest pain 07/10/2012  . HTN (hypertension) 07/10/2012  . Hyponatremia 07/10/2012  . TOBACCO USE 02/07/2010    Alexia Freestone 07/15/2015, 12:47 PM  [place], [place], [address] Phone: [phone]   Fax:  [phone]  Name: AKASHDEEP TRIMNAL MRN: [id number] Date of Birth: 05-11-1955   Allyson Sabal, PT 07/15/2015 12:48 PM

## 2015-07-25 ENCOUNTER — Ambulatory Visit: Payer: Non-veteran care

## 2015-07-25 ENCOUNTER — Ambulatory Visit: Payer: No Typology Code available for payment source

## 2015-08-31 ENCOUNTER — Ambulatory Visit: Payer: No Typology Code available for payment source | Attending: Radiation Oncology | Admitting: Physical Therapy

## 2015-08-31 DIAGNOSIS — M6281 Muscle weakness (generalized): Secondary | ICD-10-CM | POA: Diagnosis present

## 2015-08-31 DIAGNOSIS — R1310 Dysphagia, unspecified: Secondary | ICD-10-CM | POA: Diagnosis present

## 2015-08-31 DIAGNOSIS — R1312 Dysphagia, oropharyngeal phase: Secondary | ICD-10-CM | POA: Insufficient documentation

## 2015-08-31 DIAGNOSIS — I890 Lymphedema, not elsewhere classified: Secondary | ICD-10-CM | POA: Insufficient documentation

## 2015-08-31 DIAGNOSIS — R293 Abnormal posture: Secondary | ICD-10-CM | POA: Diagnosis present

## 2015-08-31 NOTE — Therapy (Addendum)
[place], [place], [address] Phone: [phone]   Fax:  [phone]  Physical Therapy Treatment  Patient Details  Name: George Nielsen MRN: [id number] Date of Birth: Mar 05, 1956 Referring Provider: Dr. Eppie Gibson  Encounter Date: 08/31/2015      PT End of Session - 08/31/15 2115    Visit Number 2   Number of Visits 25   Date for PT Re-Evaluation 09/09/15   PT Start Time 1028   PT Stop Time 1110   PT Time Calculation (min) 42 min   Activity Tolerance Patient tolerated treatment well   Behavior During Therapy [facility] for tasks assessed/performed      Past Medical History  Diagnosis Date  . Allergy   . Hypertension   . GERD (gastroesophageal reflux disease)   . Ulcer   . Hepatitis C   . COPD (chronic obstructive pulmonary disease) ([place])   . PNA (pneumonia)     Past Surgical History  Procedure Laterality Date  . Tonsillectomy    . Back surgery    . Shoulder surgery      There were no vitals filed for this visit.      Subjective Assessment - 08/31/15 1035    Subjective "I got that pump thing.  I use it about every other day, and it helps out.  I use it about 30 minutes."  Has difficulty eating and in the mornings, pills get caught in his throat.  Feels constrictive in the mornings especially.  Says his feeding tube fell out last week and he has not had it replaced; he has lost weight since then.   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  h/o lower back surgery in 2004 to fuse it with three rods and six screws; says he could barely walk before surgery.   Patient Stated Goals feel better, help get rid of some of this tightness; feels like it's constrictive in the morning and limits his motion;   Currently in Pain? Yes    Pain Score 7    Pain Location Throat  upper and lower back   Pain Descriptors / Indicators Discomfort   Aggravating Factors  lying down   Pain Relieving Factors Flexitouch, being upright   Multiple Pain Sites Yes   Pain Score 2   Pain Location Back   Pain Orientation Upper;Lower   Pain Descriptors / Indicators Aching   Aggravating Factors  nothing he can think jof   Pain Relieving Factors heat (heating pad)            [facility] PT Assessment - 08/31/15 0001    Assessment   Medical Diagnosis base of tongue cancer   Referring Provider Dr. Eppie Gibson   Onset Date/Surgical Date 09/30/14  biopsy of back of tongue   Hand Dominance Right   Precautions   Precautions Other (comment)   Precaution Comments cancer precautions, lymphedema   Restrictions   Weight Bearing Restrictions No   Balance Screen   Has the patient fallen in the past 6 months No  but says he needs to be careful   Has the patient had a decrease in activity level because of a fear of falling?  No   Is the patient reluctant to leave their home because of a fear of falling?  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   Sensation   Additional Comments Patient reports tingling in left fingers and bottoms of  his feet; pt. will [facility] about this   Posture/Postural Control   Posture/Postural Control Postural limitations   Postural Limitations Forward head;Rounded Shoulders   Posture Comments very marked forward head   ROM / Strength   AROM / PROM / Strength AROM   AROM   AROM Assessment Site Cervical   Cervical Flexion full   Cervical Extension 60% loss (feels pulling anteriorly)   Cervical - Right Side Bend 25% loss   Cervical - Left Side Bend 50% loss   Cervical - Right Rotation 50% loss and painful   Cervical - Left Rotation 50% loss and painful   Palpation   Palpation comment neck tissue feels firm and indurated; pt. points out a possible enlarged lymph node at left submental area  he was told to tell his oncologist about this tomorrow   Ambulation/Gait   Ambulation/Gait Yes   Ambulation/Gait Assistance 6: Modified independent (Device/Increase time)  reports SOB; walks 2 laps around a track, rests to go home           LYMPHEDEMA/ONCOLOGY QUESTIONNAIRE - 08/31/15 1057    Head and Neck   4 cm superior to sternal notch around neck 39.7 cm   6 cm superior to sternal notch around neck 40.4 cm   8 cm superior to sternal notch around neck 41.7 cm                  [facility] Adult PT Treatment/Exercise - 08/31/15 0001    Self-Care   Self-Care Other Self-Care Comments   Other Self-Care Comments  Fashioned foam chip pack in stockinette and placed it on patient's head around fullest part of neck.  Instructed patient in proper use of this, 2-4 hours/day,not at night, and 30 minutes prior to therapy program.                PT Education - 08/31/15 2115    Education provided Yes   Education Details use of foam chip pack for neck swelling   Person(s) Educated Patient   Methods Explanation;Demonstration   Comprehension Verbalized understanding           Short Term Clinic Goals - 08/31/15 2122    CC Short Term Goal  #1   Title Pt to receive trial of  FlexiTouch compression head and neck garment for long term management of edema   Status Achieved   CC Short Term Goal  #2   Title Pt to demonstrate a 0.5 cm decrease in circumferential measurements 8 cm up from sternal notch to improve comfort   Status Achieved   CC Short  Term Goal  #3   Title Pt to demonstrate a 0.5 cm decrease in circumferential measurements 6 cm up from sternal notch to improve comfort   Status Achieved             Long Term Clinic Goals - 07/15/15 1237    CC Long Term Goal  #1   Title Pt to be independent in self manual lymphatic drainage for long term management of edema   Baseline na   Time 8   Period Weeks   Status New   CC Long Term Goal  #2   Title Pt to receive appropriate compression garment for long term management of edema   Baseline na   Time 8   Period Weeks   Status New   CC Long Term Goal  #3   Title Pt to report at least a 50% improvement in anterior neck fibrosis for improved comfort   Time 8   Period Weeks   Status New   CC Long Term Goal  #4   Title Pt to demonstrate a 1 cm decrease in circumferential measurements 8 cm up from sternal notch to improve comfort   Baseline 45.5   Time 8   Period Weeks   Status New   CC Long Term Goal  #5   Title Pt to be independent in a home exercise program to promote lymphatic drainage and improve cervical flexibility   Time 8   Period Weeks   Status New            Plan - 09-04-2015 2116    Clinical Impression Statement Patient seemed to have a good understanding of the plan for treatment of his neck lymphedema and appears anxious to see some progress.  His measurements have decreased a bit since he was first measured about six weeks ago; this may be due to his having lost weight recently after his PEG tube fell out.  He is fibrotic at anterior neck and has marked, significant forward head posture.  His neck ROM is limited.  He seems to have little knowledge of or understanding of lymphedema.   He does already have a Flexitouch pump in place, and he finds it benefitcial.   Rehab Potential Good   PT Frequency 2x / week   PT Duration 8 weeks   PT Treatment/Interventions Vasopneumatic Device;Manual techniques;Manual lymph drainage;Therapeutic exercise;Taping;Passive range of motion;Compression bandaging;ADLs/Self Care Home Management   PT Next Visit Plan Begin manual lymph drainage and instruction in same.  Check benefit of foam chip pack.  Begin posture re-ed.   Consulted and Agree with Plan of Care Patient      Patient will benefit from skilled therapeutic intervention in order to improve the following deficits and impairments:  Decreased range of motion, Pain, Increased edema, Increased fascial restricitons  Visit Diagnosis: Lymphedema, not elsewhere classified - Plan: PT plan of care cert/re-cert  Muscle weakness (generalized) - Plan: PT plan of care cert/re-cert       G-Codes - 2015/09/04 2128    Functional Assessment Tool Used clinical judgement   Functional Limitation Self care   Changing and Maintaining Body Position Current Status [id number]) At least 80 percent but less than 100 percent impaired, limited or restricted   Changing and Maintaining Body Position Goal Status [id number]) At least 20 percent but less than 40 percent impaired, limited or restricted      Problem List Patient Active Problem List   Diagnosis Date Noted  . Malignant neoplasm of base of  tongue ([place]) 10/27/2014  . Chronic kidney disease   . COPD (chronic obstructive pulmonary disease) ([place]) 07/10/2012  . Chest pain 07/10/2012  . HTN (hypertension) 07/10/2012  . Hyponatremia 07/10/2012  . TOBACCO USE 02/07/2010    SALISBUR[place],DONNA 08/31/2015, 9:30 PM  [place], [place], [address] Phone: [phone]   Fax:  [phone]  Name: George Nielsen MRN: [id number] Date of Birth: 1955/06/28    Serafina Royals,  PT 08/31/2015 9:30 PM  Lymphedema life impact scale score is 26 = 46% impairment, with 3 questions left blank.  Serafina Royals, PT 09/09/2015 8:24 AM

## 2015-09-05 ENCOUNTER — Ambulatory Visit: Payer: No Typology Code available for payment source

## 2015-09-05 ENCOUNTER — Other Ambulatory Visit: Payer: Self-pay | Admitting: Adult Health

## 2015-09-05 DIAGNOSIS — I890 Lymphedema, not elsewhere classified: Secondary | ICD-10-CM | POA: Diagnosis not present

## 2015-09-05 DIAGNOSIS — R1310 Dysphagia, unspecified: Secondary | ICD-10-CM

## 2015-09-05 NOTE — Patient Instructions (Signed)
SWALLOWING EXERCISES   1. Effortful Swallows - Squeeze hard with the muscles in your neck while you swallow your  saliva or a sip of water - Repeat 20 times, 3 times a day, and use whenever you eat or drink  2. Masako Swallow - swallow with your tongue sticking out - Stick tongue out and gently bite tongue with your teeth - Swallow, while holding your tongue with your teeth - Repeat 20 times, 3 times a day *use a wet spoon if your mouth gets dry*  3. Shaker Exercise - head lift - Lie flat on your back in your bed or on a couch without pillows - Raise your head and look at your feet  - KEEP YOUR SHOULDERS DOWN - HOLD FOR 60 SECONDS, then lower your head back down - Repeat 3 times, 2 times a day        4.   Quick-Shaker Exercise   - Use the same head position as in the Shaker but hold head up for one second  - Do this 30 times, twice a day

## 2015-09-05 NOTE — Therapy (Signed)
[facility] [address] [place], [place], [address] Phone: [phone]   Fax:  [phone]  Speech Language Pathology Evaluation  Patient Details  Name: George Nielsen MRN: [id number] Date of Birth: 01/12/1956 Referring Provider: Eppie Gibson MD  Encounter Date: 09/05/2015      End of Session - 09/05/15 1224    Visit Number 1   Number of Visits 10   Date for SLP Re-Evaluation 12/23/15   Authorization Type [facility]   Authorization Time Period none noted   Authorization - Visit Number 1   Authorization - Number of Visits 10   SLP Start Time 0850   SLP Stop Time  0933   SLP Time Calculation (min) 43 min   Activity Tolerance Patient tolerated treatment well      Past Medical History  Diagnosis Date  . Allergy   . Hypertension   . GERD (gastroesophageal reflux disease)   . Ulcer   . Hepatitis C   . COPD (chronic obstructive pulmonary disease) ([place])   . PNA (pneumonia)     Past Surgical History  Procedure Laterality Date  . Tonsillectomy    . Back surgery    . Shoulder surgery      There were no vitals filed for this visit.      Subjective Assessment - 09/05/15 0916    Subjective Pt with difficulty with drier foods not passing through pharynx. Rice, chips, crackers, nuts.   Currently in Pain? No/denies            SLP Evaluation [facility] - 09/05/15 [id number]    SLP Visit Information   SLP Received On 09/05/15   Referring Provider Eppie Gibson MD   Medical Diagnosis Tongue Base Cancer   General Information   HPI Pt with coughing and difficulty wiht pharyngeal clearance consistently during meals.   Prior Functional Status   Cognitive/Linguistic Baseline Within functional limits   Cognition   Overall Cognitive Status Within Functional Limits for tasks assessed   Auditory Comprehension   Overall Auditory Comprehension Appears within functional limits for tasks assessed   Verbal Expression   Overall  Verbal Expression Appears within functional limits for tasks assessed   Oral Motor/Sensory Function   Overall Oral Motor/Sensory Function Impaired   Labial ROM Within Functional Limits   Labial Symmetry Within Functional Limits   Labial Strength Within Functional Limits   Lingual ROM Within Functional Limits   Lingual Symmetry Within Functional Limits   Lingual Strength Reduced  rt slightly weaker than lt   Lingual Coordination WFL   Velum Within Functional Limits   Motor Speech   Overall Motor Speech Appears within functional limits for tasks assessed     Pt currently tolerates modified dys III diet with thin liquids with reported frequent coughing during meals. Pt denies overt s/s aspiration PNA and none were observed today. Pt with moderately strong cough response and WNL voice quality. He completed head and neck rad for tongue base cancer in early October 2016, and completed HEP for swallowing for approx 3 weeks after his initial eval 11-30-14. Palpation of pt's neck lateral to thyroid revealed bil hardness. Given the patient only completed his HEP for approximately three weeks following his initial evaluation, SLP postulates this could be muscle fibrosis. He sees PT for lymphedema management as well.  POs: Pt with overt s/s difficulty with pharyngeal clearance with cereal bar and water, as there was immediate and consistent (80% of the time) throat clearing x5 bites, after  the first swallow. With swallow palpation, thyroid elevation appeared adequate, and swallows appeared timely. Oral residue noted as minimal/WFL. Pt's swallow deemed disordered at this time, and a modified barium swallow exam (MBSS) is recommended to objectify pt's swallowing disorder.   Because data states the risk for dysphagia during and after radiation treatment is high due to undergoing radiation tx, SLP taught pt about the possibility of reduced/limited ability for PO intake during rad tx. SLP encouraged pt to continue  swallowing POs as far into rad tx as possible, even ingesting POs and/or completing HEP shortly after administration of pain meds.   SLP educated pt re: changes to swallowing musculature after rad tx, and why adherence to dysphagia HEP provided today was necessary to inhibit any further suspected muscular fibrosis. Pt demonstrated understanding of these things to SLP.    SLP then developed a partial HEP for pt, and pt was instructed how to perform exercises involving lingual and pharyngeal strengthening. SLP performed each exercise and pt return demonstrated each exercise. SLP ensured pt performance was correct prior to moving on to next exercise. Pt was instructed to complete this program 3 times a day (twice for Shaker and Quick-Shaker).                     SLP Education - 09/05/15 1223    Education provided Yes   Education Details HEP, late effects head/neck radiation on swallowing, muscle fibrosis   Person(s) Educated Patient   Methods Explanation;Demonstration;Handout;Verbal cues   Comprehension Verbalized understanding;Returned demonstration;Verbal cues required;Need further instruction          SLP Short Term Goals - 09/05/15 1229    SLP SHORT TERM GOAL #1   Title pt will complete HEP with occasional min A   Time 3   Period Weeks   Status New   SLP SHORT TERM GOAL #2   Title pt will demo compensations with POs, derived from further testing of swallow ability, with occasional min A   Time 3   Period Weeks   Status New   SLP SHORT TERM GOAL #3   Title pt will tell SLP 3 s/s aspiration PNA with rare min A   Time 3   Period Weeks   Status New          SLP Long Term Goals - 09/05/15 1230    SLP LONG TERM GOAL #1   Title Pt will complete dysphagia HEP with modified independence over two sessions   Time 5   Period Weeks  or 10 visits, for all LTGs   Status New   SLP LONG TERM GOAL #2   Title over three ST sessions, pt will demonstrate compensations for  POs with rare min A   Time 5   Period Weeks   Status New          Plan - 09/05/15 1226    Clinical Impression Statement Pt presents with oropharyngeal dysphagia today, with a modified barium swallow exam (MBSS) recommended to objectify pt's swallowing ability and to plan most efficient HEP for pt. He would beneift from skilled ST to cont to assess safety with POs following MBSS, and to assess success with HEP to strengthen swallowing musculature and inhibit further muscle fibrosis of swallowing/pharyngeal musculature.   Speech Therapy Frequency 2x / week   Duration --  5 weeks, or 10 visits   Treatment/Interventions Aspiration precaution training;Pharyngeal strengthening exercises;Diet toleration management by SLP;Trials of upgraded texture/liquids;Patient/family education;Compensatory techniques;SLP instruction and feedback  Potential to Achieve Goals Good   Potential Considerations Severity of impairments   SLP Home Exercise Plan provided today   Consulted and Agree with Plan of Care Patient      Patient will benefit from skilled therapeutic intervention in order to improve the following deficits and impairments:   Dysphagia      G-Codes - 2015/09/07 1235    Functional Assessment Tool Used noms, clinical judgement - 5 (35% impaired)   Functional Limitations Swallowing   Swallow Current Status [id number]) At least 20 percent but less than 40 percent impaired, limited or restricted   Swallow Goal Status [id number]) At least 20 percent but less than 40 percent impaired, limited or restricted      Problem List Patient Active Problem List   Diagnosis Date Noted  . Malignant neoplasm of base of tongue ([place]) 10/27/2014  . Chronic kidney disease   . COPD (chronic obstructive pulmonary disease) ([place]) 07/10/2012  . Chest pain 07/10/2012  . HTN (hypertension) 07/10/2012  . Hyponatremia 07/10/2012  . TOBACCO USE 02/07/2010    [facility] ,[place], CCC-SLP  2015-09-07, 12:37 PM  [facility] [address] [place], [place], [address] Phone: [phone]   Fax:  [phone]  Name: George Nielsen MRN: [id number] Date of Birth: 1955/05/23

## 2015-09-06 ENCOUNTER — Other Ambulatory Visit: Payer: Self-pay | Admitting: Radiation Oncology

## 2015-09-06 ENCOUNTER — Telehealth: Payer: Self-pay | Admitting: *Deleted

## 2015-09-06 DIAGNOSIS — C01 Malignant neoplasm of base of tongue: Secondary | ICD-10-CM

## 2015-09-06 NOTE — Telephone Encounter (Signed)
Called patient to inform of SLP Modified Barium Swallow for 09-14-15- arrival time - 12:45 pm @ [facility] Admitting, no restrictions for test, spoke with patient and he is aware of this test.

## 2015-09-08 ENCOUNTER — Other Ambulatory Visit (HOSPITAL_COMMUNITY): Payer: Self-pay | Admitting: Radiation Oncology

## 2015-09-08 ENCOUNTER — Other Ambulatory Visit: Payer: Self-pay | Admitting: Adult Health

## 2015-09-08 ENCOUNTER — Ambulatory Visit: Payer: No Typology Code available for payment source

## 2015-09-08 DIAGNOSIS — M6281 Muscle weakness (generalized): Secondary | ICD-10-CM

## 2015-09-08 DIAGNOSIS — I890 Lymphedema, not elsewhere classified: Secondary | ICD-10-CM | POA: Diagnosis not present

## 2015-09-08 DIAGNOSIS — R1310 Dysphagia, unspecified: Secondary | ICD-10-CM

## 2015-09-08 NOTE — Therapy (Signed)
[place], [place], [address] Phone: [phone]   Fax:  [phone]  Physical Therapy Treatment  Patient Details  Name: George Nielsen MRN: [id number] Date of Birth: Aug 08, 1955 Referring Provider: Dr. Eppie Gibson  Encounter Date: 09/08/2015      PT End of Session - 09/08/15 1019    Visit Number 3   Number of Visits 25   Date for PT Re-Evaluation 09/09/15   PT Start Time 0932   PT Stop Time 1016   PT Time Calculation (min) 44 min   Activity Tolerance Patient tolerated treatment well   Behavior During Therapy [facility] for tasks assessed/performed      Past Medical History  Diagnosis Date  . Allergy   . Hypertension   . GERD (gastroesophageal reflux disease)   . Ulcer   . Hepatitis C   . COPD (chronic obstructive pulmonary disease) ([place])   . PNA (pneumonia)     Past Surgical History  Procedure Laterality Date  . Tonsillectomy    . Back surgery    . Shoulder surgery      There were no vitals filed for this visit.      Subjective Assessment - 09/08/15 0936    Subjective I've been wearing the chip pack for at least 2 hours and then try to sleep in it. It leaves little dimples on my skin from the foam.  Not having any pain this morning, just disocmfort at the throat.    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  h/o lower back surgery in 2004 to fuse it with three rods and six screws; says he could barely walk before surgery.   Patient Stated Goals feel better, help get rid of some of this tightness; feels like it's constrictive in the morning and limits his motion;   Currently in Pain? No/denies                         [facility] Adult PT Treatment/Exercise - 09/08/15 0001    Manual Therapy   Manual  Lymphatic Drainage (MLD) In Supine: Short and long neck, bil supraclavicular fossae, bil axillae nodes, bil anterior upper quadrants, superficial and deep abdominals, anterior throat, submandibular and submental nodes, bil masseters pre- and retroauricular nodes, and area under eyes and between eyes/temples, and suboccipital nodes directing all towards lateral neck. Began instructing pt in principles today                PT Education - 09/08/15 1019    Education provided Yes   Education Details Began instructing pt in basic principles of self manual lymph drainage while performing today   Person(s) Educated Patient   Methods Explanation;Demonstration   Comprehension Verbalized understanding;Need further instruction           Short Term Clinic Goals - 08/31/15 2122    CC Short Term Goal  #1   Title Pt to receive trial of FlexiTouch compression head and neck garment for long term management of edema   Status Achieved   CC Short Term Goal  #2   Title Pt to demonstrate a 0.5 cm decrease in circumferential measurements 8 cm up from sternal notch to improve comfort   Status Achieved   CC Short Term Goal  #3   Title  Pt to demonstrate a 0.5 cm decrease in circumferential measurements 6 cm up from sternal notch to improve comfort   Status Achieved             Long Term Clinic Goals - 09/08/15 1022    CC Long Term Goal  #1   Title Pt to be independent in self manual lymphatic drainage for long term management of edema   Status On-going   CC Long Term Goal  #2   Title Pt to receive appropriate compression garment for long term management of edema   Status On-going   CC Long Term Goal  #3   Title Pt to report at least a 50% improvement in anterior neck fibrosis for improved comfort   Status On-going   CC Long Term Goal  #4   Title Pt to demonstrate a 1 cm decrease in circumferential measurements 8 cm up from sternal notch to improve comfort   Status On-going   CC Long Term  Goal  #5   Title Pt to be independent in a home exercise program to promote lymphatic drainage and improve cervical flexibility   Status On-going            Plan - 09/08/15 1020    Clinical Impression Statement Pt tolerated first session of manual lymph drainage well today asking appropriate questions throughout. He will be ready to learn and practice on himself after another demonstration next visit.   Rehab Potential Good   PT Frequency 2x / week   PT Duration 8 weeks   PT Treatment/Interventions Vasopneumatic Device;Manual techniques;Manual lymph drainage;Therapeutic exercise;Taping;Passive range of motion;Compression bandaging;ADLs/Self Care Home Management   PT Next Visit Plan Renewal required next visit; Cont manual lymph drainage and instruction in same, issue handout.  Begin posture re-ed.   Consulted and Agree with Plan of Care Patient      Patient will benefit from skilled therapeutic intervention in order to improve the following deficits and impairments:  Decreased range of motion, Pain, Increased edema, Increased fascial restricitons  Visit Diagnosis: Lymphedema, not elsewhere classified  Muscle weakness (generalized)     Problem List Patient Active Problem List   Diagnosis Date Noted  . Malignant neoplasm of base of tongue ([place]) 10/27/2014  . Chronic kidney disease   . COPD (chronic obstructive pulmonary disease) ([place]) 07/10/2012  . Chest pain 07/10/2012  . HTN (hypertension) 07/10/2012  . Hyponatremia 07/10/2012  . TOBACCO USE 02/07/2010    Otelia Limes, PTA 09/08/2015, 10:22 AM  [facility] [place], [place], [address] Phone: [phone]   Fax:  [phone]  Name: George Nielsen MRN: [id number] Date of Birth: 01-16-1956

## 2015-09-09 ENCOUNTER — Inpatient Hospital Stay
Admission: RE | Admit: 2015-09-09 | Discharge: 2015-09-09 | Disposition: A | Discharge status: Home / Self Care | Payer: Self-pay | Source: Ambulatory Visit | Attending: Radiation Oncology | Admitting: Radiation Oncology

## 2015-09-09 ENCOUNTER — Other Ambulatory Visit: Payer: Self-pay | Admitting: Radiation Oncology

## 2015-09-09 ENCOUNTER — Other Ambulatory Visit: Payer: Self-pay | Admitting: Adult Health

## 2015-09-09 DIAGNOSIS — C801 Malignant (primary) neoplasm, unspecified: Secondary | ICD-10-CM

## 2015-09-09 DIAGNOSIS — R1310 Dysphagia, unspecified: Secondary | ICD-10-CM

## 2015-09-09 DIAGNOSIS — C01 Malignant neoplasm of base of tongue: Secondary | ICD-10-CM

## 2015-09-14 ENCOUNTER — Ambulatory Visit (HOSPITAL_COMMUNITY)
Admission: RE | Admit: 2015-09-14 | Discharge: 2015-09-14 | Disposition: A | Discharge status: Home / Self Care | Payer: No Typology Code available for payment source | Source: Ambulatory Visit | Attending: Radiation Oncology | Admitting: Radiation Oncology

## 2015-09-14 DIAGNOSIS — C01 Malignant neoplasm of base of tongue: Secondary | ICD-10-CM

## 2015-09-14 DIAGNOSIS — R1310 Dysphagia, unspecified: Secondary | ICD-10-CM | POA: Diagnosis not present

## 2015-09-15 ENCOUNTER — Ambulatory Visit: Payer: No Typology Code available for payment source | Admitting: Physical Therapy

## 2015-09-15 ENCOUNTER — Encounter: Payer: Self-pay | Admitting: Physical Therapy

## 2015-09-15 DIAGNOSIS — I890 Lymphedema, not elsewhere classified: Secondary | ICD-10-CM

## 2015-09-15 DIAGNOSIS — M6281 Muscle weakness (generalized): Secondary | ICD-10-CM

## 2015-09-15 DIAGNOSIS — R293 Abnormal posture: Secondary | ICD-10-CM

## 2015-09-15 NOTE — Patient Instructions (Addendum)
Manual Lymph Drainage for face/neck   1) Place hands on areas just above collar bones and do 15-20 stationary circles 2) Place hands on either side of neck and do 15-20 stationary circles  3) Do stationary circles at right armpit area (about 10 times) and the left armpit (about 10 times) 4) Standing at the head of the bed, do stationary circles on each side of the face at the jaw line (15-20) 5) Standing at the head of the bed, do stationary circles on each side of the face on the cheeks (15-20) 6) Standing at the head of the bed, do stationary circles on each side of the face between the eyes and ears (15-20) 7) Repeat step 2 8) Repeat step 1     Do not slide on the skin Only give enough pressure to stretch the skin Make sure to always wash your hands prior to massage  Exercise for stooped posture    Stand, back to wall with head, shoulders, buttocks and heels all touching wall. Hold position _30_ seconds, then take two steps away from wall. Step back to wall and correct position if needed. Repeat __3__ times. Do _3___ sessions per day.  http://gt2.exer.us/687   Copyright  VHI. All rights reserved.

## 2015-09-15 NOTE — Therapy (Signed)
[place], [place], [address] Phone: [phone]   Fax:  [phone]  Physical Therapy Treatment  Patient Details  Name: George Nielsen MRN: [id number] Date of Birth: 1956/03/05 Referring Provider: Dr. Eppie Gibson  Encounter Date: 09/15/2015      PT End of Session - 09/15/15 1015    Visit Number 4   Number of Visits 25   Date for PT Re-Evaluation 10/13/15   PT Start Time 0931   PT Stop Time 1014   PT Time Calculation (min) 43 min   Activity Tolerance Patient tolerated treatment well   Behavior During Therapy [facility] for tasks assessed/performed      Past Medical History  Diagnosis Date  . Allergy   . Hypertension   . GERD (gastroesophageal reflux disease)   . Ulcer   . Hepatitis C   . COPD (chronic obstructive pulmonary disease) ([place])   . PNA (pneumonia)     Past Surgical History  Procedure Laterality Date  . Tonsillectomy    . Back surgery    . Shoulder surgery      There were no vitals filed for this visit.      Subjective Assessment - 09/15/15 0937    Subjective Seems like my swelling has gone down some.  I wear the chip pack and my skin seems lumpy from the foam.   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  h/o lower back surgery in 2004 to fuse it with three rods and six screws; says he could barely walk before surgery.   Patient Stated Goals feel better, help get rid of some of this tightness; feels like it's constrictive in the morning and limits his motion;   Currently in Pain? No/denies               LYMPHEDEMA/ONCOLOGY QUESTIONNAIRE - 09/15/15 1005    Head and Neck   4 cm superior to sternal notch around neck 39.2 cm   6 cm superior to sternal notch around neck 40.2 cm   8 cm superior to  sternal notch around neck 42.5 cm                  [facility] Adult PT Treatment/Exercise - 09/15/15 0001    Exercises   Exercises Neck   Neck Exercises: Machines for Strengthening   Other Machines for Strengthening Had pt stand upright with buttocks, shoulders, and head against wall; pt had difficulty with getting head against wall  due to cervical parapsinal weakness and tightness.   Manual Therapy   Manual Lymphatic Drainage (MLD) In Supine: Short and long neck, bil supraclavicular fossae, bil axillae nodes, bil anterior upper quadrants, superficial and deep abdominals, anterior throat, submandibular and submental nodes, bil masseters pre- and retroauricular nodes, and area under eyes and between eyes/temples, and suboccipital nodes directing all towards lateral neck. instructed pt in principles today and he returned demo each step after therapist performed each step; did not instruct with abdominals.                PT Education - 09/15/15 [phone]    Education provided Yes   Education Details Self manual lymph drainage for head and neck; upright posture exercise   Person(s) Educated Patient   Methods Explanation;Demonstration;Tactile cues;Verbal cues;Handout   Comprehension Verbalized understanding;Returned demonstration;Verbal  cues required;Tactile cues required           Short Term Clinic Goals - 08/31/15 2122    CC Short Term Goal  #1   Title Pt to receive trial of FlexiTouch compression head and neck garment for long term management of edema   Status Achieved   CC Short Term Goal  #2   Title Pt to demonstrate a 0.5 cm decrease in circumferential measurements 8 cm up from sternal notch to improve comfort   Status Achieved   CC Short Term Goal  #3   Title Pt to demonstrate a 0.5 cm decrease in circumferential measurements 6 cm up from sternal notch to improve comfort   Status Achieved             Long Term Clinic Goals - 09/15/15 1049    CC Long Term Goal   #1   Title Pt to be independent in self manual lymphatic drainage for long term management of edema   Time 4   Period Weeks   Status On-going   CC Long Term Goal  #2   Title Pt to receive appropriate compression garment for long term management of edema   Time 4   Period Weeks   Status On-going   CC Long Term Goal  #3   Title Pt to report at least a 50% improvement in anterior neck fibrosis for improved comfort   Time 4   Period Weeks   Status On-going   CC Long Term Goal  #4   Title Pt to demonstrate a 1 cm decrease in circumferential measurements 8 cm up from sternal notch to improve comfort   Time 4   Period Weeks   Status Achieved   CC Long Term Goal  #5   Title Pt to be independent in a home exercise program to promote lymphatic drainage and improve cervical flexibility   Time 4   Period Weeks   Status On-going   CC Long Term Goal  #6   Title Pt will be able to demonstrate proper sitting and standing posture to reduce stress on cervical spine.   Time 4   Period Weeks   Status New   Additional Goals   Additional Goals Yes            Plan - 09/15/15 1015    Clinical Impression Statement Pt had significant difficulty with holding head upright today.  He needs help improving his posture and needs to work on strengthening his cervical parapsinals.  Swelling was reduced when measured today and he is pleased with his progress.  He will benefit from continued PT focused on head and neck manual lymph drainage and postural strengthening.   Rehab Potential Good   PT Frequency 2x / week   PT Duration 4 weeks   PT Treatment/Interventions Vasopneumatic Device;Manual techniques;Manual lymph drainage;Therapeutic exercise;Taping;Passive range of motion;Compression bandaging;ADLs/Self Care Home Management   PT Next Visit Plan Renewal completed Cont manual lymph drainage and instruction in same, issue handout.  Review posture exercise and progress postural exercises   Consulted and  Agree with Plan of Care Patient      Patient will benefit from skilled therapeutic intervention in order to improve the following deficits and impairments:  Decreased range of motion, Pain, Increased edema, Increased fascial restricitons  Visit Diagnosis: Lymphedema, not elsewhere classified - Plan: PT plan of care cert/re-cert  Muscle weakness (generalized) - Plan: PT plan of care cert/re-cert  Abnormal posture - Plan: PT plan of  care cert/re-cert     Problem List Patient Active Problem List   Diagnosis Date Noted  . Malignant neoplasm of base of tongue ([place]) 10/27/2014  . Chronic kidney disease   . COPD (chronic obstructive pulmonary disease) ([place]) 07/10/2012  . Chest pain 07/10/2012  . HTN (hypertension) 07/10/2012  . Hyponatremia 07/10/2012  . TOBACCO USE 02/07/2010    Annia Friendly, PT 09/15/2015 10:52 AM  [place], [place], [address] Phone: [phone]   Fax:  [phone]  Name: DORANCE PATITUCCI MRN: [id number] Date of Birth: 1955/12/04

## 2015-09-20 ENCOUNTER — Ambulatory Visit: Payer: No Typology Code available for payment source

## 2015-09-20 DIAGNOSIS — M6281 Muscle weakness (generalized): Secondary | ICD-10-CM

## 2015-09-20 DIAGNOSIS — I890 Lymphedema, not elsewhere classified: Secondary | ICD-10-CM

## 2015-09-20 DIAGNOSIS — R293 Abnormal posture: Secondary | ICD-10-CM

## 2015-09-20 NOTE — Patient Instructions (Signed)
Over Head Pull: Narrow Grip With Chin tuck    Cancer Rehab [phone]   On back, knees bent, feet flat, band across thighs, elbows straight but relaxed. Pull hands apart (start). Keeping elbows straight, bring arms up and over head, hands toward floor. Keep pull steady on band. Hold momentarily. Return slowly, keeping pull steady, back to start. Repeat _10__ times. Band color __red____   Side Pull: Double Arm with chin tuck   On back, knees bent, feet flat. Arms perpendicular to body, shoulder level, elbows straight but relaxed. Pull arms out to sides, elbows straight. Resistance band comes across collarbones, hands toward floor. Hold momentarily. Slowly return to starting position. Repeat _10__ times. Band color _red____   Sword holding chin tuck (stop this one if Rt arm discomfort increases to pain)   On back, knees bent, feet flat, left hand on left hip, right hand above left. Pull right arm DIAGONALLY (hip to shoulder) across chest. Bring right arm along head toward floor. Hold momentarily. Slowly return to starting position. Repeat _10__ times. Do with left arm. Band color _red_____   Shoulder Rotation: Double Arm holding chin tuck   On back, knees bent, feet flat, elbows tucked at sides, bent 90, hands palms up. Pull hands apart and down toward floor, keeping elbows near sides. Hold momentarily. Slowly return to starting position. Repeat __10_ times. Band color _red_____

## 2015-09-20 NOTE — Therapy (Signed)
[place], [place], [address] Phone: [phone]   Fax:  [phone]  Physical Therapy Treatment  Patient Details  Name: George Nielsen MRN: [id number] Date of Birth: 04-28-1955 Referring Provider: Dr. Eppie Gibson  Encounter Date: 09/20/2015      PT End of Session - 09/20/15 1020    Visit Number 5   Number of Visits 25   Date for PT Re-Evaluation 10/13/15   PT Start Time 0936   PT Stop Time 1018   PT Time Calculation (min) 42 min   Activity Tolerance Patient tolerated treatment well   Behavior During Therapy [facility] for tasks assessed/performed      Past Medical History  Diagnosis Date  . Allergy   . Hypertension   . GERD (gastroesophageal reflux disease)   . Ulcer   . Hepatitis C   . COPD (chronic obstructive pulmonary disease) ([place])   . PNA (pneumonia)     Past Surgical History  Procedure Laterality Date  . Tonsillectomy    . Back surgery    . Shoulder surgery      There were no vitals filed for this visit.      Subjective Assessment - 09/20/15 0937    Subjective Been doing the exercises she gave me last time. Also I've been doing the pump every morning and wearing my chip pack to bed I think it's all helping.   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  h/o lower back surgery in 2004 to fuse it with three rods and six screws; says he could barely walk before surgery.   Patient Stated Goals feel better, help get rid of some of this tightness; feels like it's constrictive in the morning and limits his motion;   Currently in Pain? Yes   Pain Score 2    Pain Location Back   Pain Orientation Lower   Pain Descriptors / Indicators Dull   Pain Type Chronic pain;Surgical pain   Pain Onset More than a month ago   Pain Frequency Intermittent   Aggravating Factors  just from old surgery, standing or sitting too long and cold weather   Pain Relieving Factors heating pad                         [facility] Adult PT Treatment/Exercise - 09/20/15 0001    Shoulder Exercises: Supine   Horizontal ABduction Strengthening;Both;5 reps;Theraband  2 set of 5 times holding chin tuck throughout   Theraband Level (Shoulder Horizontal ABduction) Level 2 (Red)   Horizontal ABduction Limitations Cuing for proper chin tuck, challenging for pt to hold.   External Rotation Strengthening;Both;5 reps;Theraband  2 sets of 5 times with holding chin tuck throughout   Flexion Strengthening;Both;5 reps;Theraband  Just narrow grip 2 sets of 5 times with chin tuck   Theraband Level (Shoulder Flexion) Level 2 (Red)   Other Supine Exercises Bil D2 7 times each side with red theraband holding chin tuck throughout, some discomfort in Rt UE so pt may not continue this one if hurting later.   Manual Therapy   Manual Lymphatic Drainage (MLD) In Supine: Short and long neck, bil supraclavicular fossae, bil axillae nodes, bil anterior upper quadrants, superficial and deep abdominals, anterior throat, submandibular and submental nodes, bil masseters pre- and retroauricular  nodes, and area under eyes and between eyes/temples, and suboccipital nodes directing all towards lateral neck. Verbally reviewed with pt today while performing.                PT Education - 09/20/15 0955    Education provided Yes   Education Details Supine scapular series with red theraband   Person(s) Educated Patient   Methods Explanation;Demonstration;Tactile cues;Handout;Verbal cues   Comprehension Verbalized understanding;Returned demonstration;Need further instruction           Short Term Clinic Goals - 08/31/15 2122    CC Short Term Goal  #1   Title Pt to receive trial of FlexiTouch compression head and neck garment for long term  management of edema   Status Achieved   CC Short Term Goal  #2   Title Pt to demonstrate a 0.5 cm decrease in circumferential measurements 8 cm up from sternal notch to improve comfort   Status Achieved   CC Short Term Goal  #3   Title Pt to demonstrate a 0.5 cm decrease in circumferential measurements 6 cm up from sternal notch to improve comfort   Status Achieved             Long Term Clinic Goals - 09/15/15 1049    CC Long Term Goal  #1   Title Pt to be independent in self manual lymphatic drainage for long term management of edema   Time 4   Period Weeks   Status On-going   CC Long Term Goal  #2   Title Pt to receive appropriate compression garment for long term management of edema   Time 4   Period Weeks   Status On-going   CC Long Term Goal  #3   Title Pt to report at least a 50% improvement in anterior neck fibrosis for improved comfort   Time 4   Period Weeks   Status On-going   CC Long Term Goal  #4   Title Pt to demonstrate a 1 cm decrease in circumferential measurements 8 cm up from sternal notch to improve comfort   Time 4   Period Weeks   Status Achieved   CC Long Term Goal  #5   Title Pt to be independent in a home exercise program to promote lymphatic drainage and improve cervical flexibility   Time 4   Period Weeks   Status On-going   CC Long Term Goal  #6   Title Pt will be able to demonstrate proper sitting and standing posture to reduce stress on cervical spine.   Time 4   Period Weeks   Status New   Additional Goals   Additional Goals Yes            Plan - 09/20/15 1026    Clinical Impression Statement Pt reports doing exercises issued him last visit and noting the benefit of these especially combined with exercises from speech therapist. Pt did well with new postural strengthening exercises issued today for scapular strength with incorporating cervical stability. Overall he is pleased with his continuing progress thus far.    Rehab  Potential Good   PT Frequency 2x / week   PT Duration 4 weeks   PT Treatment/Interventions Vasopneumatic Device;Manual techniques;Manual lymph drainage;Therapeutic exercise;Taping;Passive range of motion;Compression bandaging;ADLs/Self Care Home Management   PT Next Visit Plan Cont manual lymph drainage and instruction in same, issue handout.  Review posture exercise and progress postural exercises prn.   Consulted and Agree with Plan of Care Patient  Patient will benefit from skilled therapeutic intervention in order to improve the following deficits and impairments:  Decreased range of motion, Pain, Increased edema, Increased fascial restricitons  Visit Diagnosis: Lymphedema, not elsewhere classified  Muscle weakness (generalized)  Abnormal posture     Problem List Patient Active Problem List   Diagnosis Date Noted  . Malignant neoplasm of base of tongue ([place]) 10/27/2014  . Chronic kidney disease   . COPD (chronic obstructive pulmonary disease) ([place]) 07/10/2012  . Chest pain 07/10/2012  . HTN (hypertension) 07/10/2012  . Hyponatremia 07/10/2012  . TOBACCO USE 02/07/2010    Otelia Limes, PTA 09/20/2015, 10:32 AM  [place], [place], [address] Phone: [phone]   Fax:  [phone]  Name: George Nielsen MRN: [id number] Date of Birth: 01/31/1956

## 2015-09-21 ENCOUNTER — Ambulatory Visit: Payer: No Typology Code available for payment source

## 2015-09-21 DIAGNOSIS — I890 Lymphedema, not elsewhere classified: Secondary | ICD-10-CM | POA: Diagnosis not present

## 2015-09-21 DIAGNOSIS — R1312 Dysphagia, oropharyngeal phase: Secondary | ICD-10-CM

## 2015-09-21 NOTE — Therapy (Signed)
[place] [address] [place], [place], [address] Phone: [phone]   Fax:  [phone]  Speech Language Pathology Treatment  Patient Details  Name: George Nielsen MRN: [id number] Date of Birth: 09-20-1955 Referring Provider: Eppie Gibson MD  Encounter Date: 09/21/2015      End of Session - 09/21/15 1341    Visit Number 2   Number of Visits 10   Date for SLP Re-Evaluation 12/23/15   Authorization Type [facility]   Authorization Time Period "Learned approved 10 ST visits"   Authorization - Visit Number 2   Authorization - Number of Visits 10   SLP Start Time [phone]   SLP Stop Time  1019   SLP Time Calculation (min) 45 min   Activity Tolerance Patient tolerated treatment well      Past Medical History  Diagnosis Date  . Allergy   . Hypertension   . GERD (gastroesophageal reflux disease)   . Ulcer   . Hepatitis C   . COPD (chronic obstructive pulmonary disease) ([place])   . PNA (pneumonia)     Past Surgical History  Procedure Laterality Date  . Tonsillectomy    . Back surgery    . Shoulder surgery      There were no vitals filed for this visit.      Subjective Assessment - 09/21/15 0941    Subjective "Between and physical therapy you all keeping me busy." Pt's submental musculature is at least mod fibrous.               ADULT SLP TREATMENT - 09/21/15 0942    General Information   Behavior/Cognition Alert;Cooperative;Pleasant mood   Treatment Provided   Treatment provided Dysphagia   Dysphagia Treatment   Temperature Spikes Noted No   Respiratory Status Room air   Treatment Methods Therapeutic exercise;Patient/caregiver education;Compensation strategy training;Skilled observation   Patient observed directly with PO's Yes   Other treatment/comments Pt denied material for dys III diet foods. SLP assessed pt's completion of HEP and added one exercise to his HEP and educated pt with this  (MEndelsohn). Pt said he has been completing HEP as prescribed for approx one week. SLP reiterated to patient that HEP was necessary to have chance for more normalized swallowing function at this time. SLP reviewed precautions from modified (MBSS) wtih pt including results, precautions, and rationale for precautions. Pt named one "precaution" spontaneously ("swallow harder", which was not specified on MBSS report).    Pain Assessment   Pain Assessment 0-10   Pain Score 3    Pain Location back   Pain Descriptors / Indicators Sore;Constant   Pain Intervention(s) Monitored during session   Dysphagia Recommendations   Diet recommendations Dysphagia 3 (mechanical soft);Thin liquid   Liquids provided via Cup   Medication Administration Whole meds with puree   Compensations Slow rate;Small sips/bites;Multiple dry swallows after each bite/sip;Follow solids with liquid  "hock" after finishing meal   Progression Toward Goals   Progression toward goals Progressing toward goals          SLP Education - 09/21/15 1341    Education provided Yes   Education Details swallowing study results, precautions, and rationale for precautions, swallowing HEP   Person(s) Educated Patient   Methods Explanation;Demonstration;Verbal cues;Handout   Comprehension Returned demonstration;Verbalized understanding;Verbal cues required;Need further instruction          SLP Short Term Goals - 09/21/15 1345    SLP SHORT TERM GOAL #1   Title pt will complete  HEP with occasional min A   Time 3   Period Weeks   Status On-going   SLP SHORT TERM GOAL #2   Title pt will demo compensations with POs, derived from further testing of swallow ability, with occasional min A   Time 3   Period Weeks   Status On-going   SLP SHORT TERM GOAL #3   Title pt will tell SLP 3 s/s aspiration PNA with rare min A   Time 3   Period Weeks   Status On-going          SLP Long Term Goals - 09/21/15 1345    SLP LONG TERM GOAL #1    Title Pt will complete dysphagia HEP with modified independence over two sessions   Time 5   Period Weeks  or 10 visits, for all LTGs   Status On-going   SLP LONG TERM GOAL #2   Title over three ST sessions, pt will demonstrate compensations for POs with rare min A   Time 5   Period Weeks   Status On-going          Plan - 09/21/15 1343    Clinical Impression Statement Pt cont to present with oropharyngeal dysphagia. Modified barium swallow exam (MBSS) was completed 09-14-15 with rec: dys III thin liquids with alternate solid/liquid, small bites/sips, slow rate, whole meds with puree, multiple dry swallows. Pt unable to tell SLP any of these recommendations today. He would beneift from skilled ST to cont to assess safety with POs following MBSS, and to assess success with HEP to strengthen swallowing musculature and inhibit further muscle fibrosis of swallowing/pharyngeal musculature.   Speech Therapy Frequency 2x / week   Duration --  5 weeks, or 9 visits   Treatment/Interventions Aspiration precaution training;Pharyngeal strengthening exercises;Diet toleration management by SLP;Trials of upgraded texture/liquids;Patient/family education;Compensatory techniques;SLP instruction and feedback   Potential to Achieve Goals Good   Potential Considerations Severity of impairments   SLP Home Exercise Plan provided today   Consulted and Agree with Plan of Care Patient      Patient will benefit from skilled therapeutic intervention in order to improve the following deficits and impairments:   Oropharyngeal dysphagia    Problem List Patient Active Problem List   Diagnosis Date Noted  . Malignant neoplasm of base of tongue ([place]) 10/27/2014  . Chronic kidney disease   . COPD (chronic obstructive pulmonary disease) ([place]) 07/10/2012  . Chest pain 07/10/2012  . HTN (hypertension) 07/10/2012  . Hyponatremia 07/10/2012  . TOBACCO USE 02/07/2010    [facility] ,MS, CCC-SLP   09/21/2015,  1:47 PM  [place] [address] [place], [place], [address] Phone: [phone]   Fax:  [phone]   Name: George Nielsen MRN: [id number] Date of Birth: 08-Jul-1955

## 2015-09-21 NOTE — Patient Instructions (Signed)
5.   George Nielsen ("hold the swallow")  Swallow hard (after you do the effortful swallow) and hold the squeeze for 5-7 seconds  Do 20 times, 3 times a day

## 2015-09-22 ENCOUNTER — Ambulatory Visit: Payer: No Typology Code available for payment source

## 2015-09-22 DIAGNOSIS — R293 Abnormal posture: Secondary | ICD-10-CM

## 2015-09-22 DIAGNOSIS — I890 Lymphedema, not elsewhere classified: Secondary | ICD-10-CM | POA: Diagnosis not present

## 2015-09-22 DIAGNOSIS — M6281 Muscle weakness (generalized): Secondary | ICD-10-CM

## 2015-09-22 NOTE — Therapy (Signed)
[place], [place], [address] Phone: [phone]   Fax:  [phone]  Physical Therapy Treatment  Patient Details  Name: George Nielsen MRN: [id number] Date of Birth: 1956-03-13 Referring Provider: Dr. Eppie Gibson  Encounter Date: 09/22/2015      PT End of Session - 09/22/15 1016    Visit Number 6   Number of Visits 25   Date for PT Re-Evaluation 10/13/15   PT Start Time 0930   PT Stop Time 1012   PT Time Calculation (min) 42 min   Activity Tolerance Patient tolerated treatment well   Behavior During Therapy [facility] for tasks assessed/performed      Past Medical History  Diagnosis Date  . Allergy   . Hypertension   . GERD (gastroesophageal reflux disease)   . Ulcer   . Hepatitis C   . COPD (chronic obstructive pulmonary disease) ([place])   . PNA (pneumonia)     Past Surgical History  Procedure Laterality Date  . Tonsillectomy    . Back surgery    . Shoulder surgery      There were no vitals filed for this visit.      Subjective Assessment - 09/22/15 0933    Subjective I've started walking more and between that the exercises you and Glendell Docker (speech therapist) have given me, you guys have about killed me! I'm just doing alot more and can tell.   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  h/o lower back surgery in 2004 to fuse it with three rods and six screws; says he could barely walk before surgery.   Patient Stated Goals feel better, help get rid of some of this tightness; feels like it's constrictive in the morning and limits his motion;   Currently in Pain? No/denies               LYMPHEDEMA/ONCOLOGY QUESTIONNAIRE - 09/22/15 0934    Head and Neck   4 cm superior to sternal notch around neck 39.5 cm   6 cm  superior to sternal notch around neck 40 cm   8 cm superior to sternal notch around neck 41.9 cm                  [facility] Adult PT Treatment/Exercise - 09/22/15 0001    Manual Therapy   Manual Lymphatic Drainage (MLD) In Supine: Short and long neck, bil supraclavicular fossae, bil axillae nodes, bil anterior upper quadrants, superficial and deep abdominals, anterior throat, submandibular and submental nodes, bil masseters pre- and retroauricular nodes, and area under eyes and between eyes/temples, and suboccipital nodes directing all towards lateral neck. Verbally reviewed with pt today while performing.                   Short Term Clinic Goals - 08/31/15 2122    CC Short Term Goal  #1   Title Pt to receive trial of FlexiTouch compression head and neck garment for long term management of edema   Status Achieved   CC Short Term Goal  #2   Title Pt to demonstrate a 0.5 cm decrease in circumferential measurements 8 cm up from sternal notch to improve comfort   Status Achieved   CC Short Term Goal  #3   Title Pt to demonstrate a 0.5 cm decrease  in circumferential measurements 6 cm up from sternal notch to improve comfort   Status Achieved             Long Term Clinic Goals - 09/22/15 1020    CC Long Term Goal  #1   Title Pt to be independent in self manual lymphatic drainage for long term management of edema   Status Partially Met   CC Long Term Goal  #2   Title Pt to receive appropriate compression garment for long term management of edema   Status On-going   CC Long Term Goal  #3   Title Pt to report at least a 50% improvement in anterior neck fibrosis for improved comfort   Status On-going   CC Long Term Goal  #4   Title Pt to demonstrate a 1 cm decrease in circumferential measurements 8 cm up from sternal notch to improve comfort   Baseline 45.5, 41.9 cm 09/21/15   Status Achieved   CC Long Term Goal  #5   Title Pt to be independent in a home exercise  program to promote lymphatic drainage and improve cervical flexibility   Status Partially Met   CC Long Term Goal  #6   Title Pt will be able to demonstrate proper sitting and standing posture to reduce stress on cervical spine.   Status On-going            Plan - 09/22/15 1016    Clinical Impression Statement Pt has been very compliant with his HEP reporting noticing increase in fatigue lately which he attributes to being more active. Advised pt okay to not do as many exercises on days he feels more fatigued or on days he was more active around the house. Just overall to make sure he is listening to signs his body is giving him if he's doing too much and he verbalized understanding. Pt had some reductions with his circumference mesaurements today and he was pleased with this. Also pt wanted to weigh himself and reported he was maintaining his weight which he was happy about.    Rehab Potential Good   PT Frequency 2x / week   PT Duration 4 weeks   PT Treatment/Interventions Vasopneumatic Device;Manual techniques;Manual lymph drainage;Therapeutic exercise;Taping;Passive range of motion;Compression bandaging;ADLs/Self Care Home Management   PT Next Visit Plan Cont manual lymph drainage and instruction in same, issue handout if needed.  Review posture exercise and progress postural exercises prn.   Consulted and Agree with Plan of Care Patient      Patient will benefit from skilled therapeutic intervention in order to improve the following deficits and impairments:  Decreased range of motion, Pain, Increased edema, Increased fascial restricitons  Visit Diagnosis: Lymphedema, not elsewhere classified  Muscle weakness (generalized)  Abnormal posture     Problem List Patient Active Problem List   Diagnosis Date Noted  . Malignant neoplasm of base of tongue ([place]) 10/27/2014  . Chronic kidney disease   . COPD (chronic obstructive pulmonary disease) ([place]) 07/10/2012  . Chest pain  07/10/2012  . HTN (hypertension) 07/10/2012  . Hyponatremia 07/10/2012  . TOBACCO USE 02/07/2010    Otelia Limes, PTA 09/22/2015, 10:21 AM  [place], [place], [address] Phone: [phone]   Fax:  [phone]  Name: JAARON OLESON MRN: [id number] Date of Birth: 11-02-1955

## 2015-09-29 ENCOUNTER — Ambulatory Visit: Payer: No Typology Code available for payment source | Attending: Radiation Oncology

## 2015-09-29 DIAGNOSIS — I890 Lymphedema, not elsewhere classified: Secondary | ICD-10-CM | POA: Diagnosis present

## 2015-09-29 DIAGNOSIS — R1310 Dysphagia, unspecified: Secondary | ICD-10-CM | POA: Diagnosis present

## 2015-09-29 DIAGNOSIS — R293 Abnormal posture: Secondary | ICD-10-CM | POA: Insufficient documentation

## 2015-09-29 DIAGNOSIS — R1312 Dysphagia, oropharyngeal phase: Secondary | ICD-10-CM | POA: Diagnosis present

## 2015-09-29 DIAGNOSIS — M6281 Muscle weakness (generalized): Secondary | ICD-10-CM | POA: Insufficient documentation

## 2015-09-29 NOTE — Therapy (Signed)
[place], [place], [address] Phone: [phone]   Fax:  [phone]  Physical Therapy Treatment  Patient Details  Name: George Nielsen MRN: [id number] Date of Birth: Jun 26, 1955 Referring Provider: Dr. Eppie Gibson  Encounter Date: 09/29/2015      PT End of Session - 09/29/15 1019    Visit Number 7   Number of Visits 25   Date for PT Re-Evaluation 10/13/15   PT Start Time 0932   PT Stop Time 1018   PT Time Calculation (min) 46 min   Activity Tolerance Patient tolerated treatment well   Behavior During Therapy [facility] for tasks assessed/performed      Past Medical History  Diagnosis Date  . Allergy   . Hypertension   . GERD (gastroesophageal reflux disease)   . Ulcer   . Hepatitis C   . COPD (chronic obstructive pulmonary disease) ([place])   . PNA (pneumonia)     Past Surgical History  Procedure Laterality Date  . Tonsillectomy    . Back surgery    . Shoulder surgery      There were no vitals filed for this visit.             LYMPHEDEMA/ONCOLOGY QUESTIONNAIRE - 09/29/15 0947    Head and Neck   4 cm superior to sternal notch around neck 39.8 cm   6 cm superior to sternal notch around neck 40 cm   8 cm superior to sternal notch around neck 41.9 cm                  [facility] Adult PT Treatment/Exercise - 09/29/15 0001    Neck Exercises: Seated   Cervical Rotation Both;Other reps (comment)  2 times each side with 10 second holds    Lateral Flexion Both;Other reps (comment)  2 times each side with VC to hold 10 seconds   Manual Therapy   Manual Lymphatic Drainage (MLD) In Supine: Short and long neck, bil supraclavicular fossae, bil axillae nodes, bil anterior upper quadrants, superficial and deep abdominals, anterior throat, submandibular and submental nodes, bil masseters pre- and retroauricular nodes, and area under eyes and between eyes/temples, and suboccipital nodes  directing all towards lateral neck. Verbally reviewed with pt today while performing.                PT Education - 09/29/15 1210    Education provided Yes   Education Details Began instrucitng pt in importance of cervical flexibility today and had pt return demonstration of Bil cervical side bending and rotation with VC on importance of holding stretches >10 seconds and how improving his cervical flexibility will overall help promote better lymphatic drainage. Pt reports will try to work a few of them in during day.   Person(s) Educated Patient   Methods Explanation   Comprehension Verbalized understanding;Returned demonstration           Short Term Clinic Goals - 08/31/15 2122    CC Short Term Goal  #1   Title Pt to receive trial of FlexiTouch compression head and neck garment for long term management of edema   Status Achieved   CC Short Term Goal  #2   Title Pt to demonstrate a 0.5 cm decrease in circumferential measurements 8 cm up from sternal notch to improve comfort   Status Achieved   CC Short Term Goal  #3   Title Pt to demonstrate a 0.5 cm decrease in circumferential measurements 6 cm up  from sternal notch to improve comfort   Status Achieved             Long Term Clinic Goals - 09/29/15 0934    CC Long Term Goal  #1   Title Pt to be independent in self manual lymphatic drainage for long term management of edema   Status Achieved   CC Long Term Goal  #2   Title Pt to receive appropriate compression garment for long term management of edema   Baseline na; Pt has compression pump he uses daily and wears his chip pack daily, is not interested in pursuing compression garment for head/neck at this time but did twant handout for information so issued this today 09/29/15   Status Achieved   CC Long Term Goal  #3   Title Pt to report at least a 50% improvement in anterior neck fibrosis for improved comfort  30% imorovement reported by pt 09/29/15   Status On-going    CC Long Term Goal  #4   Title Pt to demonstrate a 1 cm decrease in circumferential measurements 8 cm up from sternal notch to improve comfort   Baseline 45.5, 41.9 cm 09/21/15, 41.9 cm 09/29/15   Status Achieved   CC Long Term Goal  #5   Title Pt to be independent in a home exercise program to promote lymphatic drainage and improve cervical flexibility   Status Partially Met   CC Long Term Goal  #6   Title Pt will be able to demonstrate proper sitting and standing posture to reduce stress on cervical spine.   Status On-going            Plan - 09/29/15 1019    Clinical Impression Statement Pt reports really likes using his compression pump and chip pack daily and is overall pleased with progress thus far but hopes to see more progress in regards to his fluid reducing.  Pts cirucmference measurements were essentially unchanged from last week showing good compliance with his Maintenance Phase of treatment at home. When trying to instruct pt in beginning of session today on cervical flexibililty pt was a little apprehensive at first about learning more exercises reporting he "has enough to do" but after explaining to him how improved muscle flexibility will help improve lymphatic flow/drainage he tried the exercises a few times.   Rehab Potential Good   PT Frequency 2x / week   PT Duration 4 weeks   PT Treatment/Interventions Vasopneumatic Device;Manual techniques;Manual lymph drainage;Therapeutic exercise;Taping;Passive range of motion;Compression bandaging;ADLs/Self Care Home Management   PT Next Visit Plan Cont manual lymph drainage and instruction in same.  Review posture exercise and progress postural exercises prn.   Consulted and Agree with Plan of Care Patient      Patient will benefit from skilled therapeutic intervention in order to improve the following deficits and impairments:  Decreased range of motion, Pain, Increased edema, Increased fascial restricitons  Visit  Diagnosis: Lymphedema, not elsewhere classified  Muscle weakness (generalized)  Abnormal posture     Problem List Patient Active Problem List   Diagnosis Date Noted  . Malignant neoplasm of base of tongue ([place]) 10/27/2014  . Chronic kidney disease   . COPD (chronic obstructive pulmonary disease) ([place]) 07/10/2012  . Chest pain 07/10/2012  . HTN (hypertension) 07/10/2012  . Hyponatremia 07/10/2012  . TOBACCO USE 02/07/2010    Otelia Limes, PTA 09/29/2015, 12:17 PM  [place], [place], [address] Phone: [phone]  Fax:  [phone]  Name: NICKOLAI RINKS MRN: [id number] Date of Birth: 1956-02-26

## 2015-10-03 ENCOUNTER — Ambulatory Visit: Payer: No Typology Code available for payment source | Admitting: *Deleted

## 2015-10-03 DIAGNOSIS — I890 Lymphedema, not elsewhere classified: Secondary | ICD-10-CM | POA: Diagnosis not present

## 2015-10-03 DIAGNOSIS — R1312 Dysphagia, oropharyngeal phase: Secondary | ICD-10-CM

## 2015-10-03 NOTE — Patient Instructions (Signed)
Pt to trial meds whole in puree

## 2015-10-03 NOTE — Therapy (Signed)
[facility] [address] [place], [place], [address] Phone: [phone]   Fax:  [phone]  Speech Language Pathology Treatment  Patient Details  Name: George Nielsen MRN: [id number] Date of Birth: [birth year] Referring Provider: Eppie Gibson MD  Encounter Date: 10/03/2015      End of Session - 10/03/15 1239    Visit Number 3   Number of Visits 10   Date for SLP Re-Evaluation 12/23/15   Authorization Type [facility]   Authorization Time Period "[place] approved 10 ST visits"   Authorization - Visit Number 3   Authorization - Number of Visits 10   SLP Start Time [id number]   SLP Stop Time  1225   SLP Time Calculation (min) 40 min   Activity Tolerance Patient tolerated treatment well      Past Medical History  Diagnosis Date  . Allergy   . Hypertension   . GERD (gastroesophageal reflux disease)   . Ulcer   . Hepatitis C   . COPD (chronic obstructive pulmonary disease) ([place])   . PNA (pneumonia)     Past Surgical History  Procedure Laterality Date  . Tonsillectomy    . Back surgery    . Shoulder surgery      There were no vitals filed for this visit.      Subjective Assessment - 10/03/15 1153    Subjective "Therapy feels like a fulltime job."   Pain Score 2    Pain Location Back               ADULT SLP TREATMENT - 10/03/15 0001    General Information   Behavior/Cognition Alert;Cooperative;Pleasant mood   Treatment Provided   Treatment provided Dysphagia   Dysphagia Treatment   Temperature Spikes Noted No   Respiratory Status Room air   Oral Cavity - Dentition Adequate natural dentition   Treatment Methods Skilled observation;Therapeutic exercise;Compensation strategy training;Patient/caregiver education   Patient observed directly with PO's Yes   Type of PO's observed Thin liquids   Pharyngeal Phase Signs & Symptoms Multiple swallows   Other treatment/comments Pt reports he has been observing a  soft diet at home and has habitualized using the effortful swallow with all PO. Pt reported that he has combined his swallowing exercises to complete the [place], [place] and effortful swallows into 1. Pt able to demonstrate this self-conceived mega swallow. Discussed that although this is possible, it decreases the number of repetitions prescribed. Encouraged pt to build-in pharyngeal exercises to his daily routine as they need to be long-term in nature to guard against late effects of radiation. Pt reports he is taking a supplement with his breakfast and has not experienced weight loss. Able to provide all of the recommendations from the MBSS with the exception of meds whole in puree. Pt initially verbalized resistance to this strategy, but after explanation for neccessity, he was somewhat more agreeable.   Pain Assessment   Pain Assessment 0-10   Pain Score 2    Pain Location back   Pain Descriptors / Indicators Sore   Pain Intervention(s) Monitored during session   Assessment / Recommendations / Plan   Plan Continue with current plan of care   Dysphagia Recommendations   Diet recommendations Thin liquid;Dysphagia 3 (mechanical soft)   Liquids provided via Cup   Medication Administration Whole meds with puree   Supervision Patient able to self feed   Compensations Slow rate;Small sips/bites;Multiple dry swallows after each bite/sip;Follow solids with liquid   Postural Changes  and/or Swallow Maneuvers Seated upright 90 degrees   Progression Toward Goals   Progression toward goals Progressing toward goals          SLP Education - 10/03/15 1239    Education provided Yes   Education Details Rationale for meds whole in puree.    Person(s) Educated Patient   Methods Explanation   Comprehension Verbalized understanding          SLP Short Term Goals - 10/03/15 1242    SLP SHORT TERM GOAL #1   Title pt will complete HEP with occasional min A   Time 2   Period Weeks   Status  On-going   SLP SHORT TERM GOAL #2   Title pt will demo compensations with POs, derived from further testing of swallow ability, with occasional min A   Time 2   Period Weeks   Status On-going   SLP SHORT TERM GOAL #3   Title pt will tell SLP 3 s/s aspiration PNA with rare min A   Time 2   Period Weeks   Status On-going          SLP Long Term Goals - 10/03/15 1244    SLP LONG TERM GOAL #1   Title Pt will complete dysphagia HEP with modified independence over two sessions   Time 5   Period Weeks   Status On-going   SLP LONG TERM GOAL #2   Title over three ST sessions, pt will demonstrate compensations for POs with rare min A   Time 5   Period Weeks   Status On-going          Plan - 10/03/15 1240    Clinical Impression Statement Pt with improved participation in [place] although he is not completing it as prescribed. Pt would benefit from ongoing education to facilitate lifestyle changes to maximize swallow function.   Speech Therapy Frequency 2x / week   Duration --  5 weeks for 9 visits   Treatment/Interventions Aspiration precaution training;Pharyngeal strengthening exercises;Diet toleration management by SLP;Trials of upgraded texture/liquids;Patient/family education;Compensatory techniques;SLP instruction and feedback   Potential to Achieve Goals Good   Potential Considerations Severity of impairments   Consulted and Agree with Plan of Care Patient      Patient will benefit from skilled therapeutic intervention in order to improve the following deficits and impairments:   Oropharyngeal dysphagia    Problem List Patient Active Problem List   Diagnosis Date Noted  . Malignant neoplasm of base of tongue ([place]) 10/27/2014  . Chronic kidney disease   . COPD (chronic obstructive pulmonary disease) ([place]) 07/10/2012  . Chest pain 07/10/2012  . HTN (hypertension) 07/10/2012  . Hyponatremia 07/10/2012  . TOBACCO USE 02/07/2010    Vinetta Bergamo MA, CCC-SLP 10/03/2015,  12:45 PM  [facility] [address] [place], [place], [address] Phone: [phone]   Fax:  [phone]   Name: George Nielsen MRN: [id number] Date of Birth: April 04, 1955

## 2015-10-04 ENCOUNTER — Ambulatory Visit: Payer: No Typology Code available for payment source

## 2015-10-04 DIAGNOSIS — I890 Lymphedema, not elsewhere classified: Secondary | ICD-10-CM

## 2015-10-04 DIAGNOSIS — R293 Abnormal posture: Secondary | ICD-10-CM

## 2015-10-04 DIAGNOSIS — M6281 Muscle weakness (generalized): Secondary | ICD-10-CM

## 2015-10-04 NOTE — Therapy (Signed)
[place], [place], [address] Phone: [phone]   Fax:  [phone]  Physical Therapy Treatment  Patient Details  Name: George Nielsen MRN: [id number] Date of Birth: [birth year] Referring Provider: Dr. Eppie Gibson  Encounter Date: 10/04/2015      PT End of Session - 10/04/15 1229    Visit Number 8   Number of Visits 25   Date for PT Re-Evaluation 10/13/15   PT Start Time 0937   PT Stop Time 1020   PT Time Calculation (min) 43 min   Activity Tolerance Patient tolerated treatment well   Behavior During Therapy [facility] for tasks assessed/performed      Past Medical History  Diagnosis Date  . Allergy   . Hypertension   . GERD (gastroesophageal reflux disease)   . Ulcer   . Hepatitis C   . COPD (chronic obstructive pulmonary disease) ([place])   . PNA (pneumonia)     Past Surgical History  Procedure Laterality Date  . Tonsillectomy    . Back surgery    . Shoulder surgery      There were no vitals filed for this visit.      Subjective Assessment - 10/04/15 0940    Subjective My Rt ear hearing has slowly been getting worse, but in the last week it's really gotten worse. I see my audiologist next mlnth so I'll wait until then, they are the ones who gave me my hearing aids.    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  h/o lower back surgery in 2004 to fuse it with three rods and six screws; says he could barely walk before surgery.   Patient Stated Goals feel better, help get rid of some of this tightness; feels like it's constrictive in the morning and limits his motion;   Currently in Pain? No/denies               LYMPHEDEMA/ONCOLOGY QUESTIONNAIRE - 10/04/15 0941    Head and Neck   4 cm superior to sternal notch  around neck 39.2 cm   6 cm superior to sternal notch around neck 40.1 cm   8 cm superior to sternal notch around neck 41.7 cm                  [facility] Adult PT Treatment/Exercise - 10/04/15 0001    Neck Exercises: Machines for Strengthening   UBE (Upper Arm Bike) Level 1, 4 minutes total (2 mins FWD/2 mins BKWD)  VC for erect posture throughout   Cybex Row 25# 2 sets of 10 times with VC for full scapular retraction.   Shoulder Exercises: Standing   Flexion Strengthening;Both;10 reps;Weights  Back/shoulders against wall for correct posture    Shoulder Flexion Weight (lbs) 2   ABduction AROM;Both;10 reps  Back/shoulders against wall within pts available ROM   Manual Therapy   Manual Lymphatic Drainage (MLD) In Supine: Short and long neck, bil supraclavicular fossae, bil axillae nodes, bil anterior upper quadrants, superficial and deep abdominals, anterior throat, submandibular and submental nodes, bil masseters pre- and retroauricular nodes, and area under eyes and between eyes/temples, and suboccipital nodes directing all towards lateral neck. Verbally reviewed with pt today while performing.  Short Term Clinic Goals - 08/31/15 2122    CC Short Term Goal  #1   Title Pt to receive trial of FlexiTouch compression head and neck garment for long term management of edema   Status Achieved   CC Short Term Goal  #2   Title Pt to demonstrate a 0.5 cm decrease in circumferential measurements 8 cm up from sternal notch to improve comfort   Status Achieved   CC Short Term Goal  #3   Title Pt to demonstrate a 0.5 cm decrease in circumferential measurements 6 cm up from sternal notch to improve comfort   Status Achieved             Long Term Clinic Goals - 10/04/15 1233    CC Long Term Goal  #1   Title Pt to be independent in self manual lymphatic drainage for long term management of edema   Status Achieved   CC Long Term Goal  #2   Title Pt to  receive appropriate compression garment for long term management of edema   Status Achieved   CC Long Term Goal  #3   Title Pt to report at least a 50% improvement in anterior neck fibrosis for improved comfort   Status On-going   CC Long Term Goal  #4   Title Pt to demonstrate a 1 cm decrease in circumferential measurements 8 cm up from sternal notch to improve comfort   Baseline 45.5, 41.9 cm 09/21/15, 41.9 cm 09/29/15, 41.7 cm 10/04/15   Status Achieved   CC Long Term Goal  #5   Title Pt to be independent in a home exercise program to promote lymphatic drainage and improve cervical flexibility   Status Partially Met   CC Long Term Goal  #6   Title Pt will be able to demonstrate proper sitting and standing posture to reduce stress on cervical spine.   Status On-going            Plan - 10/04/15 1230    Clinical Impression Statement Progressed postural strengthening exercises today (see flowsheet) which pt tolerated well and reported could feel muscles working but no pain. His circumference measurements were slightly improved from last week and overall pt is progressing well with his goals.    Rehab Potential Good   PT Frequency 2x / week   PT Duration 4 weeks   PT Treatment/Interventions Vasopneumatic Device;Manual techniques;Manual lymph drainage;Therapeutic exercise;Taping;Passive range of motion;Compression bandaging;ADLs/Self Care Home Management   PT Next Visit Plan Cont with postural sterngtheing exercises/see how pt tolerated todays session. Cont manual lymph drainage.     Consulted and Agree with Plan of Care Patient      Patient will benefit from skilled therapeutic intervention in order to improve the following deficits and impairments:  Decreased range of motion, Pain, Increased edema, Increased fascial restricitons  Visit Diagnosis: Lymphedema, not elsewhere classified  Muscle weakness (generalized)  Abnormal posture     Problem List Patient Active Problem List    Diagnosis Date Noted  . Malignant neoplasm of base of tongue ([place]) 10/27/2014  . Chronic kidney disease   . COPD (chronic obstructive pulmonary disease) ([place]) 07/10/2012  . Chest pain 07/10/2012  . HTN (hypertension) 07/10/2012  . Hyponatremia 07/10/2012  . TOBACCO USE 02/07/2010    Otelia Limes, PTA 10/04/2015, 12:35 PM  [place], [place], [address] Phone: [phone]   Fax:  [phone]  Name: George Nielsen MRN: [id number] Date  of Birth: 04-Oct-1955

## 2015-10-05 ENCOUNTER — Ambulatory Visit: Payer: No Typology Code available for payment source

## 2015-10-05 DIAGNOSIS — I890 Lymphedema, not elsewhere classified: Secondary | ICD-10-CM | POA: Diagnosis not present

## 2015-10-05 DIAGNOSIS — R1310 Dysphagia, unspecified: Secondary | ICD-10-CM

## 2015-10-05 NOTE — Therapy (Signed)
[place] [address] [place], [place], [address] Phone: [phone]   Fax:  [phone]  Speech Language Pathology Treatment  Patient Details  Name: George Nielsen MRN: [id number] Date of Birth: [birth year] Referring Provider: Eppie Gibson MD  Encounter Date: 10/05/2015      End of Session - 10/05/15 1027    Visit Number 4   Number of Visits 10   Date for SLP Re-Evaluation 12/23/15   Authorization Type VA   Authorization Time Period "VA approved 10 ST visits"   Authorization - Visit Number 4   Authorization - Number of Visits 10   SLP Start Time 0935   SLP Stop Time  [id number]   SLP Time Calculation (min) 39 min   Activity Tolerance Patient tolerated treatment well      Past Medical History  Diagnosis Date  . Allergy   . Hypertension   . GERD (gastroesophageal reflux disease)   . Ulcer   . Hepatitis C   . COPD (chronic obstructive pulmonary disease) ([place])   . PNA (pneumonia)     Past Surgical History  Procedure Laterality Date  . Tonsillectomy    . Back surgery    . Shoulder surgery      There were no vitals filed for this visit.      Subjective Assessment - 10/05/15 0951    Subjective "Y'all wearing me out."   Currently in Pain? Yes   Pain Score 3    Pain Location Back   Pain Orientation Lower   Pain Descriptors / Indicators Dull   Pain Type Chronic pain;Surgical pain   Pain Onset More than a month ago   Pain Frequency Intermittent   Aggravating Factors  cold weather, standing/sitting too long   Pain Relieving Factors heating pad               ADULT SLP TREATMENT - 10/05/15 0955    General Information   Behavior/Cognition Alert;Cooperative;Pleasant mood   Treatment Provided   Treatment provided Dysphagia   Dysphagia Treatment   Temperature Spikes Noted No   Respiratory Status Room air   Oral Cavity - Dentition Adequate natural dentition   Treatment Methods Skilled  observation;Therapeutic exercise;Compensation strategy training;Patient/caregiver education   Patient observed directly with PO's Yes   Type of PO's observed Thin liquids            SLP Short Term Goals - 10/05/15 1031    SLP SHORT TERM GOAL #1   Title pt will complete HEP with occasional min A   Time 2   Period Weeks   Status Achieved   SLP SHORT TERM GOAL #2   Title pt will demo compensations with POs, derived from further testing of swallow ability, with occasional min A   Time 2   Period Weeks   Status On-going   SLP SHORT TERM GOAL #3   Title pt will tell SLP 3 s/s aspiration PNA with rare min A   Time 2   Period Weeks   Status On-going          SLP Long Term Goals - 10/05/15 1032    SLP LONG TERM GOAL #1   Title Pt will complete dysphagia HEP with modified independence over two sessions   Time 5   Period Weeks   Status On-going   SLP LONG TERM GOAL #2   Title over three ST sessions, pt will demonstrate compensations for POs with rare min A   Time  5   Period Weeks   Status On-going          Plan - 10/05/15 1027    Clinical Impression Statement Pt with improved participation in HEP. Pt would benefit from ongoing education to facilitate lifestyle changes to maximize swallow function.   Speech Therapy Frequency 2x / week   Duration --  5 weeks or remaining 7 visits   Treatment/Interventions Aspiration precaution training;Pharyngeal strengthening exercises;Diet toleration management by SLP;Trials of upgraded texture/liquids;Patient/family education;Compensatory techniques;SLP instruction and feedback   Potential to Achieve Goals Good   Potential Considerations Severity of impairments      Patient will benefit from skilled therapeutic intervention in order to improve the following deficits and impairments:   Dysphagia    Problem List Patient Active Problem List   Diagnosis Date Noted  . Malignant neoplasm of base of tongue ([place]) 10/27/2014  . Chronic  kidney disease   . COPD (chronic obstructive pulmonary disease) ([place]) 07/10/2012  . Chest pain 07/10/2012  . HTN (hypertension) 07/10/2012  . Hyponatremia 07/10/2012  . TOBACCO USE 02/07/2010    [facility] ,MS, CCC-SLP   10/05/2015, 10:33 AM  [facility] [address] [place], [place], [address] Phone: [phone]   Fax:  [phone]   Name: George Nielsen MRN: [id number] Date of Birth: 07-07-1955

## 2015-10-06 ENCOUNTER — Ambulatory Visit: Payer: No Typology Code available for payment source

## 2015-10-06 DIAGNOSIS — M6281 Muscle weakness (generalized): Secondary | ICD-10-CM

## 2015-10-06 DIAGNOSIS — R293 Abnormal posture: Secondary | ICD-10-CM

## 2015-10-06 DIAGNOSIS — I890 Lymphedema, not elsewhere classified: Secondary | ICD-10-CM

## 2015-10-06 NOTE — Therapy (Signed)
[place], [place], [address] Phone: [phone]   Fax:  [phone]  Physical Therapy Treatment  Patient Details  Name: George Nielsen MRN: [id number] Date of Birth: [birth year] Referring Provider: Dr. Eppie Gibson  Encounter Date: 10/06/2015      PT End of Session - 10/06/15 1018    Visit Number 9   Number of Visits 25   Date for PT Re-Evaluation 10/13/15   PT Start Time 0931   PT Stop Time 1017   PT Time Calculation (min) 46 min   Activity Tolerance Patient tolerated treatment well   Behavior During Therapy [facility] for tasks assessed/performed      Past Medical History  Diagnosis Date  . Allergy   . Hypertension   . GERD (gastroesophageal reflux disease)   . Ulcer   . Hepatitis C   . COPD (chronic obstructive pulmonary disease) ([place])   . PNA (pneumonia)     Past Surgical History  Procedure Laterality Date  . Tonsillectomy    . Back surgery    . Shoulder surgery      There were no vitals filed for this visit.      Subjective Assessment - 10/06/15 0934    Subjective I don't know that I've noticed an improvement in my posture except I do know that I am much more aware of now than I used to be. I used to not pay attention to it at all.    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  h/o lower back surgery in 2004 to fuse it with three rods and six screws; says he could barely walk before surgery.   Patient Stated Goals feel better, help get rid of some of this tightness; feels like it's constrictive in the morning and limits his motion;   Currently in Pain? Yes   Pain Score 2    Pain Location Back   Pain Orientation Lower   Pain Descriptors / Indicators Dull   Pain Type Chronic pain;Surgical pain   Pain Onset More  than a month ago   Pain Frequency Intermittent   Aggravating Factors  cold weather, standing/sitting too long   Pain Relieving Factors just a heating pad                         [facility] Adult PT Treatment/Exercise - 10/06/15 0001    Neck Exercises: Machines for Strengthening   UBE (Upper Arm Bike) Level 1, 4 minutes total (2 mins FWD/2 mins BKWD)  Less VC required for erect posture today   Cybex Row 25# 2 sets of 10 times with improved technique today   Other Machines for Strengthening FreeMotion for bil UE extension 3 kg each, 2 sets of 10 times with VC to keep core engaged and hold erect posture   Neck Exercises: Supine   Shoulder Flexion Both;10 reps;Other (comment)  Supine on towel roll between scapulae for bil "V"   Shoulder ABduction Both;10 reps;Other (comment)  Supine on towel roll between scapulae for horizontal abd   Manual Therapy   Manual Lymphatic Drainage (MLD) In Supine: Short and long neck, bil supraclavicular fossae, bil axillae nodes, bil anterior upper quadrants, superficial and deep abdominals, anterior throat, submandibular and submental nodes, bil masseters pre- and retroauricular nodes, and area  under eyes and between eyes/temples, and suboccipital nodes directing all towards lateral neck. Verbally reviewed with pt today while performing.                   Short Term Clinic Goals - 08/31/15 2122    CC Short Term Goal  #1   Title Pt to receive trial of FlexiTouch compression head and neck garment for long term management of edema   Status Achieved   CC Short Term Goal  #2   Title Pt to demonstrate a 0.5 cm decrease in circumferential measurements 8 cm up from sternal notch to improve comfort   Status Achieved   CC Short Term Goal  #3   Title Pt to demonstrate a 0.5 cm decrease in circumferential measurements 6 cm up from sternal notch to improve comfort   Status Achieved             Long Term Clinic Goals - 10/04/15 1233    CC  Long Term Goal  #1   Title Pt to be independent in self manual lymphatic drainage for long term management of edema   Status Achieved   CC Long Term Goal  #2   Title Pt to receive appropriate compression garment for long term management of edema   Status Achieved   CC Long Term Goal  #3   Title Pt to report at least a 50% improvement in anterior neck fibrosis for improved comfort   Status On-going   CC Long Term Goal  #4   Title Pt to demonstrate a 1 cm decrease in circumferential measurements 8 cm up from sternal notch to improve comfort   Baseline 45.5, 41.9 cm 09/21/15, 41.9 cm 09/29/15, 41.7 cm 10/04/15   Status Achieved   CC Long Term Goal  #5   Title Pt to be independent in a home exercise program to promote lymphatic drainage and improve cervical flexibility   Status Partially Met   CC Long Term Goal  #6   Title Pt will be able to demonstrate proper sitting and standing posture to reduce stress on cervical spine.   Status On-going            Plan - 10/06/15 1019    Clinical Impression Statement Continued with postural strengthening today as pt did not have any increased pain after last session. Progressed pt with a few other postural exercises today as well (see flowsheet) which he reported tolerating okay, just felt a little sore after but also "knows he needs it". Pt continues to show good progress towards his goals and plans on D/C at this time possibly next week per POC.    Rehab Potential Good   PT Frequency 2x / week   PT Duration 4 weeks   PT Treatment/Interventions Vasopneumatic Device;Manual techniques;Manual lymph drainage;Therapeutic exercise;Taping;Passive range of motion;Compression bandaging;ADLs/Self Care Home Management   PT Next Visit Plan D/C per POC next week? Cont postural strength and manual lymph drainage.   Consulted and Agree with Plan of Care Patient      Patient will benefit from skilled therapeutic intervention in order to improve the following  deficits and impairments:  Decreased range of motion, Pain, Increased edema, Increased fascial restricitons  Visit Diagnosis: Lymphedema, not elsewhere classified  Muscle weakness (generalized)  Abnormal posture     Problem List Patient Active Problem List   Diagnosis Date Noted  . Malignant neoplasm of base of tongue ([place]) 10/27/2014  . Chronic kidney disease   . COPD (chronic  obstructive pulmonary disease) ([place]) 07/10/2012  . Chest pain 07/10/2012  . HTN (hypertension) 07/10/2012  . Hyponatremia 07/10/2012  . TOBACCO USE 02/07/2010    Otelia Limes, PTA 10/06/2015, 10:24 AM  [place], [place], [address] Phone: [phone]   Fax:  [phone]  Name: George Nielsen MRN: [id number] Date of Birth: 1955/12/03

## 2015-10-10 ENCOUNTER — Ambulatory Visit: Payer: No Typology Code available for payment source

## 2015-10-10 DIAGNOSIS — I890 Lymphedema, not elsewhere classified: Secondary | ICD-10-CM | POA: Diagnosis not present

## 2015-10-10 DIAGNOSIS — R1310 Dysphagia, unspecified: Secondary | ICD-10-CM

## 2015-10-10 NOTE — Therapy (Signed)
[facility] [address] [place], [place], [address] Phone: [phone]   Fax:  [phone]  Speech Language Pathology Treatment  Patient Details  Name: George Nielsen MRN: [id number] Date of Birth: 1955/05/19 Referring Provider: Eppie Gibson MD  Encounter Date: 10/10/2015      End of Session - 10/10/15 1227    Visit Number 5   Number of Visits 10   Date for SLP Re-Evaluation 12/23/15   Authorization Type VA   Authorization Time Period "VA approved 10 ST visits"   Authorization - Visit Number 5   Authorization - Number of Visits 10   SLP Start Time [phone]   SLP Stop Time  1011  pt satisfied with progress in today's visit   SLP Time Calculation (min) 35 min   Activity Tolerance Patient tolerated treatment well      Past Medical History  Diagnosis Date  . Allergy   . Hypertension   . GERD (gastroesophageal reflux disease)   . Ulcer   . Hepatitis C   . COPD (chronic obstructive pulmonary disease) ([place])   . PNA (pneumonia)     Past Surgical History  Procedure Laterality Date  . Tonsillectomy    . Back surgery    . Shoulder surgery      There were no vitals filed for this visit.      Subjective Assessment - 10/10/15 0942    Subjective "(the exercises) are a pain in the neck." Pt reports he feels coughing less-frequently than prior to ST.               ADULT SLP TREATMENT - 10/10/15 0944    General Information   Behavior/Cognition Alert;Cooperative;Pleasant mood   Treatment Provided   Treatment provided Dysphagia   Dysphagia Treatment   Temperature Spikes Noted No   Respiratory Status Room air   Oral Cavity - Dentition Adequate natural dentition   Patient observed directly with PO's Yes   Type of PO's observed Thin liquids   Oral Phase Signs & Symptoms --  none observed   Pharyngeal Phase Signs & Symptoms --  none observed   Other treatment/comments SLP observed pt with his HEP -  pt was independent. SLP added pt's choice of exercise, either towel hold or chin push/fist exercise. SLP educated pt re: s/s aspiration PNA with explanation and handout provided. Pt told SLP 3 s/s independently.   Pain Assessment   Pain Assessment 0-10   Pain Score 3    Pain Location back   Pain Descriptors / Indicators Sore   Pain Intervention(s) Monitored during session   Assessment / Recommendations / Plan   Plan Continue with current plan of care   Progression Toward Goals   Progression toward goals Progressing toward goals          SLP Education - 10/10/15 1226    Education provided Yes   Education Details s/s aspiration PNA, towel hold and chin/fist resist exercises   Person(s) Educated Patient   Methods Explanation;Demonstration;Handout   Comprehension Verbalized understanding;Returned demonstration;Verbal cues required          SLP Short Term Goals - 10/10/15 1229    SLP SHORT TERM GOAL #1   Title pt will complete HEP with occasional min A   Time 1   Period Weeks   Status Achieved   SLP SHORT TERM GOAL #2   Title pt will demo compensations with POs, derived from further testing of swallow ability, with occasional min A  Status Deferred  until next session, time constraints witheld checking this goal   SLP SHORT TERM GOAL #3   Title pt will tell SLP 3 s/s aspiration PNA with rare min A   Status Achieved          SLP Long Term Goals - 10/10/15 1230    SLP LONG TERM GOAL #1   Title Pt will complete dysphagia HEP with modified independence over two sessions   Baseline one session 10-10-15   Time 4   Period Weeks   Status On-going   SLP LONG TERM GOAL #2   Title over three ST sessions, pt will demonstrate compensations for POs with rare min A   Time 4   Period Weeks   Status On-going          Plan - 10/10/15 1006    Clinical Impression Statement Pt with improved participation in [place]. He has additional exercise to choose from to add to his regimen  (either towel hold or chin/fist resist exercise) Pt would benefit from ongoing education to facilitate lifestyle changes to maximize swallow function. Due to progress, pt will be reduced to once/week.    Speech Therapy Frequency 1x /week   Duration 4 weeks  or remaining 6 visits   Treatment/Interventions Aspiration precaution training;Pharyngeal strengthening exercises;Diet toleration management by SLP;Trials of upgraded texture/liquids;Patient/family education;Compensatory techniques;SLP instruction and feedback   Potential to Achieve Goals Good   Potential Considerations Severity of impairments      Patient will benefit from skilled therapeutic intervention in order to improve the following deficits and impairments:   Dysphagia    Problem List Patient Active Problem List   Diagnosis Date Noted  . Malignant neoplasm of base of tongue ([place]) 10/27/2014  . Chronic kidney disease   . COPD (chronic obstructive pulmonary disease) ([place]) 07/10/2012  . Chest pain 07/10/2012  . HTN (hypertension) 07/10/2012  . Hyponatremia 07/10/2012  . TOBACCO USE 02/07/2010    [facility] ,MS, CCC-SLP  10/10/2015, 12:30 PM  [place] [address] [place], [place], [address] Phone: [phone]   Fax:  [phone]   Name: George Nielsen MRN: [id number] Date of Birth: 27-Nov-1955

## 2015-10-10 NOTE — Patient Instructions (Signed)
Add one of these to your exercise regimen:  TOWEL HOLD: Roll up a towel 3-4 inches in diameter, put under your chin and hold for 45 seconds - 3 times, twice a day  OR  CHIN-FIST RESIST Open your mouth, put your fist under your chin (near your neck), and press up for 5 seconds keeping your mouth open. Repeat 10 times, twice a day  ======================================================================   Signs of Aspiration Pneumonia   . Chest pain/tightness . Fever (can be low grade) . Cough  o With foul-smelling phlegm (sputum) o With sputum containing pus or blood o With greenish sputum . Fatigue  . Shortness of breath  . Wheezing   **IF YOU HAVE THESE SIGNS, CONTACT YOUR DOCTOR OR GO TO THE EMERGENCY DEPARTMENT OR URGENT CARE AS SOON AS POSSIBLE**

## 2015-10-11 ENCOUNTER — Ambulatory Visit: Payer: No Typology Code available for payment source

## 2015-10-11 DIAGNOSIS — R293 Abnormal posture: Secondary | ICD-10-CM

## 2015-10-11 DIAGNOSIS — I890 Lymphedema, not elsewhere classified: Secondary | ICD-10-CM

## 2015-10-11 DIAGNOSIS — M6281 Muscle weakness (generalized): Secondary | ICD-10-CM

## 2015-10-11 NOTE — Therapy (Signed)
[place], [place], [address] Phone: [phone]   Fax:  [phone]  Physical Therapy Treatment  Patient Details  Name: George Nielsen MRN: [id number] Date of Birth: 1955/06/22 Referring Provider: Dr. Eppie Gibson  Encounter Date: 10/11/2015      PT End of Session - 10/11/15 0943    Visit Number 10   Number of Visits 25   Date for PT Re-Evaluation 10/13/15   PT Start Time 0932   PT Stop Time 1015   PT Time Calculation (min) 43 min   Activity Tolerance Patient tolerated treatment well   Behavior During Therapy [facility] for tasks assessed/performed      Past Medical History  Diagnosis Date  . Allergy   . Hypertension   . GERD (gastroesophageal reflux disease)   . Ulcer   . Hepatitis C   . COPD (chronic obstructive pulmonary disease) ([place])   . PNA (pneumonia)     Past Surgical History  Procedure Laterality Date  . Tonsillectomy    . Back surgery    . Shoulder surgery      There were no vitals filed for this visit.      Subjective Assessment - 10/11/15 0935    Subjective George Nielsen gave me an exercise trying to prevent aspiration pneumonia I think? I've been more focused on standing and sitting taller. Felt fine after last visit, just tired like usual.    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  h/o lower back surgery in 2004 to fuse it with three rods and six screws; says he could barely walk before surgery.   Patient Stated Goals feel better, help get rid of some of this tightness; feels like it's constrictive in the morning and limits his motion;   Currently in Pain? Yes   Pain Score 2    Pain Location Back   Pain Orientation Right   Pain Descriptors / Indicators Dull   Pain Type Chronic pain;Surgical pain   Pain  Onset More than a month ago   Pain Frequency Intermittent   Aggravating Factors  cold weather; standing/sitting too long   Pain Relieving Factors heating pad               LYMPHEDEMA/ONCOLOGY QUESTIONNAIRE - 10/11/15 1000    Head and Neck   4 cm superior to sternal notch around neck 39 cm   6 cm superior to sternal notch around neck 39.7 cm   8 cm superior to sternal notch around neck 40.9 cm                  [facility] Adult PT Treatment/Exercise - 10/11/15 0001    Neck Exercises: Machines for Strengthening   UBE (Upper Arm Bike) Level 1.5, 4 minutes total (2 mins FWD/2 mins BKWD)   Other Machines for Strengthening FreeMotion for bil UE extension 3 kg each, 2 sets of 10 times, then scapular retraction, tried 4.5 kg and got to 5 times but too heavy so completed the 2 sets of 10 with 3 kg each with VC to keep core engaged and hold erect posture   Neck Exercises: Supine   Shoulder ABduction Both;10 reps;Weights;Other (comment)  Horizontal abd on towel roll   Shoulder Abduction Weights (lbs) 2   Manual Therapy   Manual Lymphatic Drainage (MLD) In  Supine: Short and long neck, bil supraclavicular fossae, bil axillae nodes, bil anterior upper quadrants.                   Short Term Clinic Goals - 10/11/15 1124    CC Short Term Goal  #1   Title Pt to receive trial of FlexiTouch compression head and neck garment for long term management of edema   Status Achieved   CC Short Term Goal  #2   Title Pt to demonstrate a 0.5 cm decrease in circumferential measurements 8 cm up from sternal notch to improve comfort   Baseline 45.5, 40.9 cm 10/11/15   Status Achieved   CC Short Term Goal  #3   Title Pt to demonstrate a 0.5 cm decrease in circumferential measurements 6 cm up from sternal notch to improve comfort   Baseline 42, 39.7 cm 10/11/15   Status Achieved             Long Term Clinic Goals - 10/11/15 1116    CC Long Term Goal  #1   Title Pt to be independent  in self manual lymphatic drainage for long term management of edema   Status Achieved   CC Long Term Goal  #2   Title Pt to receive appropriate compression garment for long term management of edema   Status Achieved   CC Long Term Goal  #3   Title Pt to report at least a 50% improvement in anterior neck fibrosis for improved comfort  Pt reports this feeling much improved, >50%.   Status Achieved   CC Long Term Goal  #4   Title Pt to demonstrate a 1 cm decrease in circumferential measurements 8 cm up from sternal notch to improve comfort   Baseline 45.5, 41.9 cm 09/21/15, 41.9 cm 09/29/15, 41.7 cm 10/04/15, 40.9 cm 10/11/15   Status Achieved   CC Long Term Goal  #5   Title Pt to be independent in a home exercise program to promote lymphatic drainage and improve cervical flexibility   Status Achieved   CC Long Term Goal  #6   Title Pt will be able to demonstrate proper sitting and standing posture to reduce stress on cervical spine.  Pt demonstrates improved posture throughout session today with less VC to correct this.    Status Partially Met            Plan - 10/11/15 0944    Clinical Impression Statement Pt demonstrated more erect posture throughout session today with less VCs and he reports he finds himself more aware of this now, more than he ever used to be. Progressing well towards his goals and plans on D/C end of this week per POC. Though pt was asking if he could cont if the [place] would approve more visits so he was going to call about this, but therapist did encourage pt that he has made great progress and is ready for D/C.    Rehab Potential Good   PT Frequency 2x / week   PT Duration 4 weeks   PT Treatment/Interventions Vasopneumatic Device;Manual techniques;Manual lymph drainage;Therapeutic exercise;Taping;Passive range of motion;Compression bandaging;ADLs/Self Care Home Management   PT Next Visit Plan Probable D/C per POC next visit but pt [facility] to see if [facility] could  get more visits. Cont postural strength and manual lymph drainage.   Consulted and Agree with Plan of Care Patient      Patient will benefit from skilled therapeutic intervention in order to  improve the following deficits and impairments:  Decreased range of motion, Pain, Increased edema, Increased fascial restricitons  Visit Diagnosis: Lymphedema, not elsewhere classified  Muscle weakness (generalized)  Abnormal posture     Problem List Patient Active Problem List   Diagnosis Date Noted  . Malignant neoplasm of base of tongue ([place]) 10/27/2014  . Chronic kidney disease   . COPD (chronic obstructive pulmonary disease) ([place]) 07/10/2012  . Chest pain 07/10/2012  . HTN (hypertension) 07/10/2012  . Hyponatremia 07/10/2012  . TOBACCO USE 02/07/2010    Otelia Limes, PTA 10/11/2015, 11:33 AM  [place], [place], [address] Phone: [phone]   Fax:  [phone]  Name: George Nielsen MRN: [id number] Date of Birth: 1956/02/27

## 2015-10-17 ENCOUNTER — Ambulatory Visit: Payer: No Typology Code available for payment source

## 2015-10-17 DIAGNOSIS — I890 Lymphedema, not elsewhere classified: Secondary | ICD-10-CM | POA: Diagnosis not present

## 2015-10-17 DIAGNOSIS — R1310 Dysphagia, unspecified: Secondary | ICD-10-CM

## 2015-10-17 NOTE — Therapy (Signed)
[facility] [address] [place], [place], [address] Phone: [phone]   Fax:  [phone]  Speech Language Pathology Treatment  Patient Details  Name: George Nielsen MRN: [id number] Date of Birth: 13-Feb-1956 Referring Provider: Eppie Gibson MD  Encounter Date: 10/17/2015      End of Session - 10/17/15 0930    Visit Number 6   Number of Visits 10   Date for SLP Re-Evaluation 12/23/15   Authorization Type [facility]   Authorization Time Period "[place] approved 10 ST visits"   Authorization - Visit Number 6   Authorization - Number of Visits 10   SLP Start Time [phone]   SLP Stop Time  [phone]   SLP Time Calculation (min) 39 min   Activity Tolerance Patient tolerated treatment well      Past Medical History:  Diagnosis Date  . Allergy   . COPD (chronic obstructive pulmonary disease) ([place])   . GERD (gastroesophageal reflux disease)   . Hepatitis C   . Hypertension   . PNA (pneumonia)   . Ulcer     Past Surgical History:  Procedure Laterality Date  . BACK SURGERY    . SHOULDER SURGERY    . TONSILLECTOMY      There were no vitals filed for this visit.      Subjective Assessment - 10/17/15 0857    Subjective Pt reports cramping - has had this in the past.               ADULT SLP TREATMENT - 10/17/15 0857      General Information   Behavior/Cognition Alert;Cooperative;Pleasant mood     Treatment Provided   Treatment provided Dysphagia     Dysphagia Treatment   Temperature Spikes Noted No  denies s/s aspiration PNA   Other treatment/comments Pt alternated between towel hold and fist push/chin resist exercise at home since last visit, both looked WNL today. Pt performed his "Super swallow" combining Masako, Mendelsohn, and effortful swallow. SLP reminded pt he needed to get approx 120 reps of that exercise in each day.  He refused the Shaker, telling SLP he is "cramping up." SLP told him to contact PCP  if cramping got worse. Pt ate cereal bar and drank H2O with usual throat clears after sips. SLP told pt to ensure he was using sips between bites and after this he did so. Pt with occasional clearing after that time. He provided SLP with 4 s/s aspiration PNA independently, 6 with min questioning cues.     Assessment / Recommendations / Plan   Plan --  pt will decr to x1 every other week due to progress     Dysphagia Recommendations   Diet recommendations Dysphagia 3 (mechanical soft);Thin liquid   Compensations Slow rate;Small sips/bites;Multiple dry swallows after each bite/sip;Follow solids with liquid   Postural Changes and/or Swallow Maneuvers Seated upright 90 degrees            SLP Short Term Goals - 10/10/15 1229      SLP SHORT TERM GOAL #1   Title pt will complete HEP with occasional min A   Time 1   Period Weeks   Status Achieved     SLP SHORT TERM GOAL #2   Title pt will demo compensations with POs, derived from further testing of swallow ability, with occasional min A   Status Deferred  until next session, time constraints witheld checking this goal     SLP SHORT TERM GOAL #3  Title pt will tell SLP 3 s/s aspiration PNA with rare min A   Status Achieved          SLP Long Term Goals - 10/17/15 0932      SLP LONG TERM GOAL #1   Title Pt will complete dysphagia HEP with modified independence over two sessions   Status Achieved     SLP LONG TERM GOAL #2   Title over three ST sessions, pt will demonstrate compensations for POs with rare min A   Baseline one session 10-17-15   Time 3   Period Weeks  or visits   Status On-going          Plan - 10/17/15 0930    Clinical Impression Statement Pt independent with HEP, and is educated re: aspiration PNA. He needed cues for alternating sips/bites and when he did he had less throat clearing frequency. Pt is reduced to x1/every other week  due to progress. Likely d/c in 1-2 visits   Speech Therapy Frequency  Biweekly   Duration --  3 weeks/visits   Treatment/Interventions Aspiration precaution training;Pharyngeal strengthening exercises;Diet toleration management by SLP;Trials of upgraded texture/liquids;Patient/family education;Compensatory techniques;SLP instruction and feedback   Potential to Achieve Goals Good   Potential Considerations Severity of impairments      Patient will benefit from skilled therapeutic intervention in order to improve the following deficits and impairments:   Dysphagia    Problem List Patient Active Problem List   Diagnosis Date Noted  . Malignant neoplasm of base of tongue ([place]) 10/27/2014  . Chronic kidney disease   . COPD (chronic obstructive pulmonary disease) ([place]) 07/10/2012  . Chest pain 07/10/2012  . HTN (hypertension) 07/10/2012  . Hyponatremia 07/10/2012  . TOBACCO USE 02/07/2010    [facility] ,[place], CCC-SLP  10/17/2015, 9:33 AM  [facility] [address] [place], [place], [address] Phone: [phone]   Fax:  [phone]   Name: George Nielsen MRN: [id number] Date of Birth: 1955/05/10

## 2015-10-18 ENCOUNTER — Ambulatory Visit: Payer: No Typology Code available for payment source

## 2015-11-02 ENCOUNTER — Ambulatory Visit: Payer: No Typology Code available for payment source | Attending: Radiation Oncology

## 2015-11-02 DIAGNOSIS — R1310 Dysphagia, unspecified: Secondary | ICD-10-CM | POA: Diagnosis not present

## 2015-11-03 DIAGNOSIS — R1310 Dysphagia, unspecified: Secondary | ICD-10-CM | POA: Diagnosis not present

## 2015-11-03 NOTE — Therapy (Signed)
[facility] [address] [place], [place], [address] Phone: [phone]   Fax:  [phone]  Speech Language Pathology Treatment  Patient Details  Name: George Nielsen MRN: [id number] Date of Birth: [birth year] Referring Provider: Eppie Gibson MD  Encounter Date: 11/02/2015      End of Session - 11/03/15 1741    Visit Number 7   Number of Visits 10   Date for SLP Re-Evaluation 12/23/15   Authorization Type [facility]   Authorization Time Period "[place] approved 10 ST visits"   Authorization - Visit Number 7   Authorization - Number of Visits 10   SLP Start Time 4401   SLP Stop Time  1100   SLP Time Calculation (min) 39 min   Activity Tolerance Patient limited by lethargy      Past Medical History:  Diagnosis Date  . Allergy   . COPD (chronic obstructive pulmonary disease) ([place])   . GERD (gastroesophageal reflux disease)   . Hepatitis C   . Hypertension   . PNA (pneumonia)   . Ulcer     Past Surgical History:  Procedure Laterality Date  . BACK SURGERY    . SHOULDER SURGERY    . TONSILLECTOMY      There were no vitals filed for this visit.      Subjective Assessment - 11/02/15 1025    Subjective "I can tell, I can feel whenever something is down in there."               ADULT SLP TREATMENT - 11/03/15 0001      General Information   Behavior/Cognition Alert;Cooperative;Pleasant mood     Treatment Provided   Treatment provided Dysphagia     Dysphagia Treatment   Temperature Spikes Noted No   Patient observed directly with PO's Yes   Type of PO's observed Dysphagia 3 (soft);Thin liquids   Pharyngeal Phase Signs & Symptoms Delayed throat clear;Wet vocal quality;Multiple swallows   Other treatment/comments pt denies s/s aspriation PNA/non observed today. Pt has not done HEP to prescribed frequency. Is performing equivalent of 5 reps/exercise twice per day. Pt complained of"knotting up" in his  neck with Shaker so SLP suggested towel hold as alternative, as pt told SLP he had not been performing this exercise, so SLP suggested omitting Shaker and substituting with towel hold exercise from this time onward.  Pt's HEP done independently including towel hold. Pt ate american cheese and drank H2O with wet voice on 25% of the swallows, cleared with spontaneous throat clear. He used his precautions from modified barium swallow exam withindependence.     Pain Assessment   Pain Assessment 0-10   Pain Score 3    Pain Location back   Pain Descriptors / Indicators Aching   Pain Intervention(s) Monitored during session     Dysphagia Recommendations   Diet recommendations Dysphagia 3 (mechanical soft);Thin liquid   Compensations Slow rate;Small sips/bites;Multiple dry swallows after each bite/sip;Follow solids with liquid   Postural Changes and/or Swallow Maneuvers Seated upright 90 degrees     Progression Toward Goals   Progression toward goals Goals met, education completed, patient discharged from [facility] - 10/10/15 [place] #1   Title pt will complete HEP with occasional min A   Time 1   Period Weeks   Status Achieved     SLP SHORT TERM  GOAL #2   Title pt will demo compensations with POs, derived from further testing of swallow ability, with occasional min A   Status Deferred  until next session, time constraints witheld checking this goal     SLP SHORT TERM GOAL #3   Title pt will tell SLP 3 s/s aspiration PNA with rare min A   Status Achieved          SLP Long Term Goals - 11/03/15 1742      SLP LONG TERM GOAL #1   Title Pt will complete dysphagia HEP with modified independence over two sessions   Status Achieved     SLP LONG TERM GOAL #2   Title over three ST sessions, pt will demonstrate compensations for POs with rare min A   Baseline two sessions 11-08-15   Period --  or visits   Status Partially Met           Plan - 11/03/15 1741    Clinical Impression Statement Pt continues independent with HEP, and remains educated re: aspiration PNA. Precautions with POs were independnent today. Pt is appropriate for d/c today and he agrees.   Treatment/Interventions Aspiration precaution training;Pharyngeal strengthening exercises;Diet toleration management by SLP;Trials of upgraded texture/liquids;Patient/family education;Compensatory techniques;SLP instruction and feedback   Potential to Achieve Goals Good   Potential Considerations Severity of impairments     SPEECH THERAPY DISCHARGE SUMMARY  Visits from Start of Care: 7  Current functional level related to goals / functional outcomes: Pt improved his ability to independently monitor swallow precautions with POs during therapy sessions. He remains free of s/s aspiration PNA.  Pt notably complained about time/effort to complete HEP for swallowing. SLP req'd to provide consistent encouragement to pt to complete HEP as prescribed, with explanations with many of those instances of encouragement.   Remaining deficits: See goal update, above. Mild dysphagia suspected.   Education / Equipment: HEP, swallow precautions, need to complete HEP as directed to mitigate muscle fibrosis/cont'd dysphagia . Plan: Patient agrees to discharge.  Patient goals were partially met. pt is independent with HEP as well as with swallow precautions, and is without overt s/s aspiration PNA. Pt needs to cont with HEP and with precautions. RECOMMEND FOLLOW UP MBSS IN MID-September.?????     7 Patient will benefit from skilled therapeutic intervention in order to improve the following deficits and impairments:   Dysphagia      G-Codes - 11-08-15 1743    Functional Assessment Tool Used noms, clinical judgement - 5 (~25% impaired)   Functional Limitations Swallowing   Swallow Goal Status (O1751) At least 20 percent but less than 40 percent impaired, limited or  restricted   Swallow Discharge Status [phone]) At least 20 percent but less than 40 percent impaired, limited or restricted      Problem List Patient Active Problem List   Diagnosis Date Noted  . Malignant neoplasm of base of tongue ([place]) 10/27/2014  . Chronic kidney disease   . COPD (chronic obstructive pulmonary disease) ([place]) 07/10/2012  . Chest pain 07/10/2012  . HTN (hypertension) 07/10/2012  . Hyponatremia 07/10/2012  . TOBACCO USE 02/07/2010    [facility] ,[place], CCC-SLP  11/03/2015, 5:44 PM  [facility] [address] [place], [place], [address] Phone: [phone]   Fax:  [phone]   Name: TOMAZ JANIS MRN: [id number] Date of Birth: 07/16/1955

## 2015-11-03 NOTE — Patient Instructions (Signed)
I will see what Dr. Isidore Moos thinks about  scheduling another swallow test in mid September.

## 2015-11-04 NOTE — Telephone Encounter (Signed)
Oncology Nurse Navigator Documentation  Oncology Nurse Navigator Flowsheets 07/06/2015 07/07/2015 07/14/2015  Navigator Location [facility]  Navigator Encounter Type Telephone Letter/Fax/Email Telephone  Telephone Outgoing Call - Outgoing Call  Abnormal Finding Date - - -  Confirmed Diagnosis Date - - -  Treatment Initiated Date - - -  Patient Visit Type - - -  Treatment Phase - - -  Barriers/Navigation Needs - - -  Interventions Coordination of Care - -  Coordination of Care - - Other  Education Method - - -  Time Spent with Patient 15 15 15

## 2015-11-07 ENCOUNTER — Other Ambulatory Visit: Payer: Self-pay | Admitting: Radiation Oncology

## 2015-11-07 DIAGNOSIS — C01 Malignant neoplasm of base of tongue: Secondary | ICD-10-CM

## 2015-11-09 ENCOUNTER — Other Ambulatory Visit (HOSPITAL_COMMUNITY): Payer: Self-pay | Admitting: Radiation Oncology

## 2015-11-09 DIAGNOSIS — R1310 Dysphagia, unspecified: Secondary | ICD-10-CM

## 2015-12-06 ENCOUNTER — Encounter: Payer: Self-pay | Admitting: Adult Health

## 2015-12-06 NOTE — Progress Notes (Signed)
Encounter opened in error. Please disregard.   Mike Craze, NP [place] [phone]

## 2015-12-07 ENCOUNTER — Ambulatory Visit (HOSPITAL_BASED_OUTPATIENT_CLINIC_OR_DEPARTMENT_OTHER): Payer: No Typology Code available for payment source | Admitting: Adult Health

## 2015-12-07 ENCOUNTER — Ambulatory Visit (HOSPITAL_COMMUNITY): Payer: No Typology Code available for payment source

## 2015-12-07 ENCOUNTER — Telehealth: Payer: Self-pay | Admitting: Adult Health

## 2015-12-07 ENCOUNTER — Ambulatory Visit (HOSPITAL_COMMUNITY)
Admission: RE | Admit: 2015-12-07 | Discharge: 2015-12-07 | Disposition: A | Discharge status: Home / Self Care | Payer: No Typology Code available for payment source | Source: Ambulatory Visit | Attending: Radiation Oncology | Admitting: Radiation Oncology

## 2015-12-07 ENCOUNTER — Encounter: Payer: Self-pay | Admitting: Adult Health

## 2015-12-07 VITALS — BP 121/71 | HR 79 | Temp 98.4°F | Resp 18 | Ht 72.0 in | Wt 159.2 lb

## 2015-12-07 DIAGNOSIS — C01 Malignant neoplasm of base of tongue: Secondary | ICD-10-CM | POA: Diagnosis not present

## 2015-12-07 DIAGNOSIS — E039 Hypothyroidism, unspecified: Secondary | ICD-10-CM | POA: Diagnosis not present

## 2015-12-07 NOTE — Telephone Encounter (Signed)
I called [facility] to speak with someone from Dr. Kasandra Knudsen team re: physical exam findings today (are these findings new? Does Dr. Edison Nasuti want to see the patient sooner than 02/2016?)  I was able to speak with Anderson Malta, the case manager who works with Dr. Edison Nasuti.  She let me know that Dr. Edison Nasuti was not in their clinic today, but would be back on Friday, 12/09/15.  She reviewed Dr. Eugenia Pancoast last office note with me via phone. I asked if Anderson Malta would please fax [place] a copy of that note so that we may have it for our records.  I let her know that I would also fax my office visit note to them so that Dr. Edison Nasuti could review that and decide if she would to re-evaluate the patient.    Phone number for [facility] dept: [phone], ext [phone] or ext [phone].  Extension for [employer], case manager: [phone] Fax number for [place]: [place], [place] [phone]

## 2015-12-07 NOTE — Progress Notes (Signed)
CLINIC:  Survivorship  REASON FOR VISIT:  Routine follow-up for head & neck cancer.  BRIEF ONCOLOGIC HISTORY:    Malignant neoplasm of base of tongue ([place])   09/14/2014 Imaging    CT neck [facility]): Irregular mucosal thickening at base of tongue, suspicious for neoplasm such as squamous cell carcinoma. Enlarged bilateral level 2 and right level 3 LNs suspicious for metastatic disease. LNs elsewhere in neck are conspicuous, but not frankly enlarged by size criteria.       09/30/2014 Surgery    [facility]): s/p panendoscopy & tracheotomy. Lurline Idol done because patient could not be intubated for procedure].       09/30/2014 Initial Biopsy    [facility]; Accession No. [id number]).  Right tongue base mass revealed invasive squamous cell carcinoma, moderately to poorly differentiated. HPV(+).        10/19/2014 PET scan    [facility]): Prominent hypermetabolic activity in tongue mass centered in lingual tonsils c/w reported history of tongue cancer. Multiple hypermetabolic (R) posterior cervical and (L) level 2 cervical lymph notes present. Small hypermetabolic nodule in (L) parotid gland. No worrisome hypermetabolic activity in chest, abdomen, or pelvis. No worrisome hypermetabolic osseous lesions.       10/27/2014 Initial Diagnosis    Malignant neoplasm of base of tongue (HCC)      Chemotherapy    CDDP at the [facility] (records not available for review)        11/10/2014 - 12/29/2014 Radiation Therapy    Radiation therapy [facility]). BOT and bilateral neck / 70 Gy in 35 fractions to gross disease, 63 Gy in 35 fractions to high risk nodal echelons, and 56 Gy in 35 fractions to intermediate risk nodal echelons      05/10/2015 PET scan    [facility]): Post-radiation changes in neck with only mild metabolic activity in posterior tongue at site of previously noted hypermetabolic mass. Interval resolution of hypermetabolic cervical lymphadenopathy. No worrisome hypermetabolic foci in neck, chest, abdomen, or  pelvis.       08/16/2015 PET scan    [facility]): Post-radiation changes seen in neck without worrisome focus of hypermetabolic activity. No foci of hypermetabolic activity in cervical lymph nodes. Tiny, sub 4 mm lung nodules seen and stable. No abnormal uptake in lungs, mediastinal/hilar lymph nodes.  No abnormal update in abdomen, pelvis, or bones.       INTERVAL HISTORY:  Mr. 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    -ENT: He last saw Dr. Silvestre Moment from the [facility] a couple of weeks ago.  He was told at that time that "everything looked good" and she would see him again in 02/2016.   -Pain: Denies any throat pain, which the exception of occasional pain with dry mouth.  Has chronic back pain; does not take any prescribed pain medications.   -Appetite/Dysphagia: Appetite is okay; he is eating better. Feeding tube "fell out" in January/Febuary.  He has lost about 3 pounds since his last visit to the cancer center.  Taste has not improved much; able to taste some sweet foods, but not salty foods; this is distressing for him because he used to love salty foods.  Has been working with Garald Balding, SLP and patient feels better about managing his dysphagia now. "I know what to do if I get strangled now and can manage it on my own now."  He has occasional nausea. He has hiccups often.    -  Xerostomia: Dry mouth is one of his biggest complaints today.  He uses the Biotene gel, as well as another artificial saliva product which helps.    -Neck lymphedema: Has been working with PT; he uses his lymphedema pump every day at home.  He feels like his symptoms are improving and has been pleased with the reduction in his neck measurements over time since using the pump.    -Oral care: Using fluoride trays daily.    -Fatigue: Continues to struggle with fatigue. He tells me he was recently diagnosed with hypothyroidism, but has  not been able to start the Synthroid because he is waiting on coordination of care from the [place] and his PCP.     -Tobacco/Alcohol: Continues to abstain from both; quit smoking about 10 months ago.  Still has cravings for cigarettes; did not find the nicotine patches helpful.  He denies any alcohol use.  -Depression: Endorses "spells of depression"; denies any suicidal/homicidal ideation.   ADDITIONAL REVIEW OF SYSTEMS:  Review of Systems  Constitutional: Positive for malaise/fatigue and weight loss.  HENT:       Hearing aids  Eyes: Negative.   Respiratory: Positive for cough.        Chronic productive cough (h/o COPD)  Cardiovascular: Negative.   Gastrointestinal: Positive for nausea. Negative for constipation and vomiting.       Bowels moving regularly Hiccups daily Occasional nausea (about 1x/week)  Genitourinary: Negative.   Musculoskeletal: Positive for back pain.       Chronic back pain  Skin: Negative.   Psychiatric/Behavioral: Positive for depression.    CURRENT MEDICATIONS:  Current Outpatient Prescriptions on File Prior to Visit  Medication Sig Dispense Refill  . amLODipine (NORVASC) 10 MG tablet Take 10 mg by mouth daily.    Marland Kitchen lisinopril (PRINIVIL,ZESTRIL) 20 MG tablet Take 20 mg by mouth daily.    Marland Kitchen albuterol (PROVENTIL HFA;VENTOLIN HFA) 108 (90 BASE) MCG/ACT inhaler Inhale 2 puffs into the lungs every 6 (six) hours as needed for wheezing or shortness of breath. Reported on 07/15/2015    . nitroGLYCERIN (NITROSTAT) 0.4 MG SL tablet Place 1 tablet (0.4 mg total) under the tongue every 5 (five) minutes as needed for chest pain (do not take more than 3 tablets in a row). (Patient not taking: Reported on 12/07/2015) 30 tablet 0   No current facility-administered medications on file prior to visit.   *Of note, patient's medication list as listed above is not complete; he notes that he takes several other medications but he is not sure of the drug names/dosages. He did not bring  his medications or updated drug list to this appointment.   ALLERGIES:  Allergies  Allergen Reactions  . Bee Venom Swelling  . Vicodin [Hydrocodone-Acetaminophen] Nausea And Vomiting  . Poison Ivy Extract [Poison Ivy Extract] Rash     PHYSICAL EXAM:  Vitals:   12/07/15 1230  BP: 121/71  Pulse: 79  Resp: 18  Temp: 98.4 F (36.9 C)   Filed Weights   12/07/15 1230  Weight: 159 lb 3.2 oz (72.2 kg)   Wt Readings from Last 3 Encounters:  06/29/15 162 lb 6.4 oz (73.664 kg)  01/12/15 156 lb 1.6 oz (70.806 kg)  12/27/14 157 lb 11.2 oz (71.532 kg)   General: Chronically-ill appearing male in no acute distress. Unaccompanied today.  HEENT: Head is atraumatic.  Pupils equal and reactive to light. Conjunctivae clear without exudate.  Sclerae anicteric. Oral mucosa is pink and dry.  Tongue is furry/dark brown.  No palpable masses to the base of tongue, buccal mucosa, or floor of mouth. Oropharynx is pink with mild erythema.  Lymph: Notable (L) submandibular firm, non-mobile mass, measuring about 2 cm.  Also with (R) submandibular, firm, non-mobile mass that is slightly smaller, measuring about 1 cm.   Neck: Skin on neck is intact. Mild lymphedema; (+) anterior neck fibrosis.   Cardiovascular: Normal rate and rhythm. Respiratory: Expiratory wheezes to bilateral bases, otherwise clear to auscultation. Productive cough (h/o COPD).  Breathing non-labored; no accessory muscle use.  GI: Abdomen soft and round. Abdomen non-tender, non-distended.  GU: Deferred.   Neuro: No focal deficits. Steady gait.   Psych: Mildly flat affect.  Extremities: No edema.   Skin: Warm and dry.    LABORATORY DATA:  None at this visit.  DIAGNOSTIC IMAGING:  None at this visit.    ASSESSMENT & PLAN:  George Nielsen is a pleasant 60 y.o. male with history of Stage IVA (T3N2cM0) squamous cell carcinoma of the base of tongue, diagnosed in 08/2014; treated with concurrent chemoradiation with CDDP through the [facility]  and radiation therapy at [facility]; completed treatment on 12/29/14.  Patient presents to survivorship clinic today for routine follow-up and continued surveillance.   1. Cancer of the base of tongue:  Mr. Layden will continue surveillance with Dr. Silvestre Moment at the [facility] and Dr. Eppie Gibson at our cancer center.  He will see Dr. Edison Nasuti in 02/2016; he will see Dr. Isidore Moos in 05/2016. Mr. Einstein understands the importance of close follow-up during the first 2 years after treatment, as this is the highest risk of recurrence in most H&N cancer patients.    2. Bilateral neck masses: There are bilateral discrete, firm, palpable masses bilaterally on the patient's neck today in the submandibular regions. This may simply be related to submandibular swelling, but malignancy cannot be excluded.  His last PET scan in 07/2015 showed no evidence of malignancy in the neck. The fact that the masses are symmetric is mildly reassuring for benign etiology, but I discussed the patient's physical exam findings with Dr. Isidore Moos, the patient's radiation oncologist today.  Dr. Isidore Moos recommended that I reach out to Dr. Edison Nasuti at the [facility] to inquire if these masses are indeed new or if they were noted in her previous visit with Mr. Travassos.  I will call the [place] and try to speak to Dr. Eugenia Pancoast team regarding this patient.  If he needs additional imaging, then I will defer to Dr. Eugenia Pancoast team at the [facility] to coordinate that testing since the patient's insurance requires his imaging to be done at a [place] facility.    3. Hypothyroidism: Reportedly patient now has hypothyroidism. This could certainly be contributing to his continued complaint of fatigue.  Encouraged him to follow-up with his PCP about getting started on the Synthroid as soon as he is able.  We will continue to defer TSH monitoring to his [facility] team.    4. Lymphedema: He has completed PT for lymphedema. Continue use of lymphedema pump at home.  Report any worsening  symptoms to his physical therapy or medical team.    5. Appetite/Weight loss/Xerostomia: Encouraged to continue to work on trying different foods, as tolerated.  Overall, his weight is stable with only about 3 lb weight loss in the past 5 months.  High-protein, high-calorie soft foods remain important in his diet.   6. Oral care: Continue close follow-up with dentist, at least 3-4 times per year. Continue fluoride trays to  prevent dental caries associated with xerostomia after radiation therapy for H&N cancer.   7. Tobacco/Alcohol cessation:  I commended his hard work in remaining tobacco and alcohol-free.  He currently is taking the Wellbutrin to help with his smoking cessation efforts.  The Wellbutrin may also be giving him the added benefit of mood stability as well, which is great.  Although his cancer was HPV (+), he understands the importance of not smoking to help prevent recurrence of H&N cancers or new primary cancers.    8. Adjustment disorder with depressed mood: I offered support today through active listening, validation/normalization of his concerns, and expressive supportive counseling.  He is currently on Wellbutrin for smoking cessation, but his dose is also therapeutic for mood stabalization as well.  Currently, he feels like his mood is well-managed and stable. I encouraged him to let his team know if he begins to feel more sad/tearful and we can certainly help ensure he gets access to the appropriate resources for additional support.    9. Medication adherence: Encouraged Mr. Morath to bring an updated medication list to his next visit so that we may have an accurate record of his current medications.  He understands the importance of this and will try to remember to bring his med list to his next follow-up here at the cancer center.      Dispo:  -Follow-up with Dr. Edison Nasuti at the [facility] in 02/2016.  -Return to cancer center to see Dr. Isidore Moos in 05/2016.    A total of 35 minutes  was spent in the face-to-face care of this patient, with greater than 50% of that time spent in counseling and care-coordination.   Mike Craze, NP [place] [phone]

## 2015-12-14 ENCOUNTER — Encounter: Payer: Self-pay | Admitting: Adult Health

## 2015-12-14 NOTE — Telephone Encounter (Signed)
Fax number incorrect for Anderson Malta, Tourist information centre manager at [facility].  Correct fax number is: Wise, NP [place] [phone]

## 2015-12-14 NOTE — Progress Notes (Signed)
Office visit from 12/07/15 faxed to [facility], attention Anderson Malta (the case manager who works with Dr. Edison Nasuti).   I had attempted to fax this note several times and the fax failed, so I called Anderson Malta today at the [place] and the fax number I had was incorrect.  Fax then resent to [facility] successfully today.   Mike Craze, NP [place] [phone]

## 2015-12-22 ENCOUNTER — Ambulatory Visit (HOSPITAL_COMMUNITY)
Admission: RE | Admit: 2015-12-22 | Discharge: 2015-12-22 | Disposition: A | Discharge status: Home / Self Care | Payer: No Typology Code available for payment source | Source: Ambulatory Visit | Attending: Radiation Oncology | Admitting: Radiation Oncology

## 2015-12-22 DIAGNOSIS — C01 Malignant neoplasm of base of tongue: Secondary | ICD-10-CM | POA: Diagnosis not present

## 2015-12-22 DIAGNOSIS — R1310 Dysphagia, unspecified: Secondary | ICD-10-CM

## 2016-01-03 ENCOUNTER — Telehealth: Payer: Self-pay | Admitting: *Deleted

## 2016-01-03 NOTE — Telephone Encounter (Signed)
CALLED PATIENT TO INFORM OF FU ON 06-08-16 @ 10:20 AM, SPOKE WITH PATIENT AND HE IS AWARE OF THIS APPT.

## 2016-06-06 ENCOUNTER — Encounter: Payer: Self-pay | Admitting: Radiation Oncology

## 2016-06-06 NOTE — Progress Notes (Addendum)
Mr. George Nielsen presents for follow up of radiation completed 12/29/14 to his BOT and bilateral neck.   Pain issues, if any: He denies pain except lower back pain which is chronic. He does report discomfort in throat from swelling Using a feeding tube?: No Weight changes, if any:  Wt Readings from Last 3 Encounters:  06/08/16 161 lb (73 kg)  12/07/15 159 lb 3.2 oz (72.2 kg)  06/29/15 162 lb 6.4 oz (73.7 kg)   Swallowing issues, if any: He has had speech therapy and swallowing therpy which he says has helped. He tells me that food and pills will get "caught" in his throat, and he will cough it up to clear it.  Smoking or chewing tobacco? No Using fluoride trays daily? Yes he is using them twice daily for 30 minutes.  Last ENT visit was on: Dr. Silvestre Moment ([place]) last seen 05/30/16 to have PET scan results from 05/28/16 resulted and had laryngoscopy performed at that time. He tells me that she observed excessive swelling to his throat.  Other notable issues, if any:  He has brought his results from his PET scan with him today He reports that his skin is itching, and [facility] is [facility] and other medicine.  He tells me he is taking something to supplement his thyroid and his blood, but does not have the names and dosages, so they were not added to his medicine list. He will let us know updates.  BP 134/67   Pulse 76   Temp 98.4 F (36.9 C)   Ht 6' (1.829 m)   Wt 161 lb (73 kg)   SpO2 99% Comment: room air  BMI 21.84 kg/m

## 2016-06-08 ENCOUNTER — Ambulatory Visit
Admission: RE | Admit: 2016-06-08 | Discharge: 2016-06-08 | Disposition: A | Discharge status: Home / Self Care | Payer: No Typology Code available for payment source | Source: Ambulatory Visit | Attending: Radiation Oncology | Admitting: Radiation Oncology

## 2016-06-08 ENCOUNTER — Encounter: Payer: Self-pay | Admitting: Radiation Oncology

## 2016-06-08 DIAGNOSIS — C01 Malignant neoplasm of base of tongue: Secondary | ICD-10-CM

## 2016-06-08 DIAGNOSIS — Z79899 Other long term (current) drug therapy: Secondary | ICD-10-CM | POA: Insufficient documentation

## 2016-06-08 DIAGNOSIS — M545 Low back pain: Secondary | ICD-10-CM | POA: Insufficient documentation

## 2016-06-08 HISTORY — DX: Personal history of irradiation: Z92.3

## 2016-06-08 NOTE — Progress Notes (Signed)
Radiation Oncology         [phone]) [phone] ________________________________  Name: George Nielsen MRN: [id number]  Date: 06/08/2016  DOB: [birth year]  Follow-Up Visit Note  CC: Pcp Not In System  Berneice Heinrich, MD  Diagnosis and Prior Radiotherapy:       ICD-9-CM ICD-10-CM   1. Malignant neoplasm of base of tongue ([place]) 141.0 C01    T3N2cM0 Stage IVA Base of Tongue Squamous Cell Carcinoma    11/10/14- 12/29/14 : BOT and bilateral neck treated to 70 Gy in 35 fractions.  Narrative:  The patient returns today for routine follow-up of radiation completed 12/29/14.  On review of systems, the patient denies pain, other than some chronic lower back pain. He reports mild discomfort in throat from swelling. He has had speech/swallowing therapy, which he reports has helped his swallowing, though occasionally food will still feel "caught" in his throat and he must cough to clear it. The patient is not using a feeding tube. He is using daily fluoride trays and sees dentist regularly.. The patient's last ENT visit with Dr. Silvestre Moment ([place]) was 05/30/16 to discuss his PET scan from 05/28/16. He underwent laryngoscopy at this time, which he reports revealed swelling in his throat.  The patient brought copies of his recent PET scan with him today. I reviewed the images which look good. The report indicates no worrisome hypermetabolic foci within the head, neck, chest, abdomen, or pelvis. I reviewed the images personally during the encounter.  ALLERGIES:  is allergic to bee venom; vicodin [hydrocodone-acetaminophen]; and poison ivy extract [poison ivy extract].  Meds: Current Outpatient Prescriptions  Medication Sig Dispense Refill  . albuterol (PROVENTIL HFA;VENTOLIN HFA) 108 (90 BASE) MCG/ACT inhaler Inhale 2 puffs into the lungs every 6 (six) hours as needed for wheezing or shortness of breath. Reported on 07/15/2015    . amLODipine (NORVASC) 10 MG tablet Take 10 mg by mouth daily.    Marland Kitchen ascorbic acid  (VITAMIN C) 250 MG tablet Take 250 mg by mouth daily.    Marland Kitchen buPROPion (WELLBUTRIN SR) 150 MG 12 hr tablet Take 150 mg by mouth 2 (two) times daily.    Marland Kitchen gabapentin (NEURONTIN) 100 MG capsule Take 100 mg by mouth daily.    Marland Kitchen guaifenesin (HUMIBID E) 400 MG TABS tablet Take 400 mg by mouth every 6 (six) hours as needed.    Marland Kitchen lisinopril (PRINIVIL,ZESTRIL) 20 MG tablet Take 20 mg by mouth daily.    Marland Kitchen loratadine (CLARITIN) 10 MG tablet Take 10 mg by mouth daily.    . pantoprazole (PROTONIX) 40 MG tablet Take 40 mg by mouth daily.    . ranitidine (ZANTAC) 300 MG tablet Take 300 mg by mouth at bedtime.    . sodium chloride 1 g tablet Take 1 g by mouth once.    . nitroGLYCERIN (NITROSTAT) 0.4 MG SL tablet Place 1 tablet (0.4 mg total) under the tongue every 5 (five) minutes as needed for chest pain (do not take more than 3 tablets in a row). (Patient not taking: Reported on 12/07/2015) 30 tablet 0   No current facility-administered medications for this encounter.     Physical Findings: The patient is in no acute distress. Patient is alert and oriented. Wt Readings from Last 3 Encounters:  06/08/16 161 lb (73 kg)  12/07/15 159 lb 3.2 oz (72.2 kg)  06/29/15 162 lb 6.4 oz (73.7 kg)    height is 6' (1.829 m) and weight is 161 lb (73 kg). His  temperature is 98.4 F (36.9 C). His blood pressure is 134/67 and his pulse is 76. His oxygen saturation is 99%.  General: Alert and oriented, in no acute distress. HEENT: Bilateral hearing aids in place. Mucous membranes are reasonably moist on exam. Oropharynx and oral cavity are clear.    Neck: Neck is notable for no cervical or supraclavicular masses. Some tissue fibrosis in the submandibular region bilaterally. Skin: Skin is well healed over the neck. Chest: Heart regular in rate and rhythm, no murmurs. Lungs: Clear to auscultation bilaterally. Abdomen: Soft, non tender, non distended. Extremities: Some pitting edema in lower extremities. Psych: insight and  judgment appear intact.  Affect is pleasant  Laryngoscopy deferred due to recent procedure with Dr. Edison Nasuti.   Lab Findings: Lab Results  Component Value Date   WBC 8.5 11/01/2014   HGB 14.5 11/01/2014   HCT 41.9 11/01/2014   MCV 92.6 11/01/2014   PLT 224 11/01/2014    Lab Results  Component Value Date   TSH 0.705 11/01/2014    Radiographic Findings:  as above  Impression/Plan:    1) Head and Neck Cancer Status: NED   2) Nutritional Status: weight stable PEG tube: no  3) Risk Factors: The patient has been educated about risk factors including alcohol and tobacco abuse; they understand that avoidance of tobacco is important to prevent recurrences as well as other cancers.   4) Swallowing: somewhat difficult due to lack of saliva. Try Biotene artificial saliva gel. Add sauces to food.   5) Dental: Encouraged to continue regular followup with dentistry, and dental hygiene including fluoride rinses.  6) Thyroid function: continue supplements and testing at [facility] Lab Results  Component Value Date   TSH 0.705 11/01/2014    7) Other:  none  8) Follow-up with me in 6 months.   _____________________________________   Eppie Gibson, MD  This document serves as a record of services personally performed by Eppie Gibson, MD. It was created on her behalf by Maryla Morrow, a trained medical scribe. The creation of this record is based on the scribe's personal observations and the provider's statements to them. This document has been checked and approved by the attending provider.

## 2016-11-29 NOTE — Progress Notes (Signed)
George Nielsen presents for follow up of radiation completed 12/29/14 to his Base of Tongue and bilateral neck.   Pain issues, if any: He denies Using a feeding tube?: No Weight changes, if any:  Wt Readings from Last 3 Encounters:  12/07/16 161 lb (73 kg)  06/08/16 161 lb (73 kg)  12/07/15 159 lb 3.2 oz (72.2 kg)   Swallowing issues, if any: He reports continued taste changes. He has decreased saliva. He drinks water frequently to help swallow foods.  Smoking or chewing tobacco? No Using fluoride trays daily? He is using fluoride trays twice daily.  Last ENT visit was on: Dr. Silvestre Moment ([place]), he tells me he last saw her about 2 months ago. He has another appointment in October. Other notable issues, if any:  He saw his medical oncologist through the [place] yesterday (Dr. Roslyn Smiling) at [facility]. He reports he had a recent scan that was "good".  BP 129/60   Pulse 78   Temp 98.2 F (36.8 C)   Ht 6' (1.829 m)   Wt 161 lb (73 kg)   SpO2 100% Comment: room air  BMI 21.84 kg/m

## 2016-12-07 ENCOUNTER — Encounter: Payer: Self-pay | Admitting: Radiation Oncology

## 2016-12-07 ENCOUNTER — Ambulatory Visit
Admission: RE | Admit: 2016-12-07 | Discharge: 2016-12-07 | Disposition: A | Discharge status: Home / Self Care | Payer: Non-veteran care | Source: Ambulatory Visit | Attending: Radiation Oncology | Admitting: Radiation Oncology

## 2016-12-07 VITALS — BP 129/60 | HR 78 | Temp 98.2°F | Ht 72.0 in | Wt 161.0 lb

## 2016-12-07 DIAGNOSIS — Z91048 Other nonmedicinal substance allergy status: Secondary | ICD-10-CM | POA: Insufficient documentation

## 2016-12-07 DIAGNOSIS — Z85810 Personal history of malignant neoplasm of tongue: Secondary | ICD-10-CM | POA: Diagnosis not present

## 2016-12-07 DIAGNOSIS — Z79899 Other long term (current) drug therapy: Secondary | ICD-10-CM | POA: Diagnosis not present

## 2016-12-07 DIAGNOSIS — Z885 Allergy status to narcotic agent status: Secondary | ICD-10-CM | POA: Insufficient documentation

## 2016-12-07 DIAGNOSIS — Z91030 Bee allergy status: Secondary | ICD-10-CM | POA: Diagnosis not present

## 2016-12-07 DIAGNOSIS — C01 Malignant neoplasm of base of tongue: Secondary | ICD-10-CM

## 2016-12-07 DIAGNOSIS — Z923 Personal history of irradiation: Secondary | ICD-10-CM | POA: Diagnosis not present

## 2016-12-07 NOTE — Progress Notes (Addendum)
Radiation Oncology         [phone]) [phone] ________________________________  Name: George Nielsen MRN: [id number]  Date: 12/07/2016  DOB: [birth year]  Follow-Up Visit Note  CC: System, George Nielsen  George Heinrich, MD  Diagnosis and Prior Radiotherapy:       ICD-10-CM   1. Malignant neoplasm of base of tongue ([place]) C01    T3N2cM0 Stage IVA Base of Tongue Squamous Cell Carcinoma    Radiation treatment dates: 11/10/2014 - 12/29/2014 Site/dose: BOT and bilateral neck treated to 70 Gy Nielsen 35 fractions  Chief Complaint: Followup for base of tongue cancer  Narrative:  The patient returns today for routine follow-up of radiation completed 12/29/2014 to the base of his tongue and his bilateral neck.   Pain issues, if any: He denies. Using a feeding tube?: No. Weight changes, if any:  Wt Readings from Last 3 Encounters:  12/07/16 161 lb (73 kg)  06/08/16 161 lb (73 kg)  12/07/15 159 lb 3.2 oz (72.2 kg)   Swallowing issues, if any: He reports continued taste changes. He has decreased saliva. He drinks water frequently to help swallow foods.  Smoking or chewing tobacco? No. Using fluoride trays daily? He is using fluoride trays twice daily.  Last ENT visit was on: About 2 months ago he saw George Nielsen ([place]). He has another appointment scheduled Nielsen October. Other notable issues, if any: He saw his medical oncologist, Dr. Roslyn Nielsen, through the [facility] yesterday at [facility]. He reports he had a recent CT scan that was "good". I don't have access to it today.  He reports normal TSH labs.  ALLERGIES:  is allergic to bee venom; vicodin [hydrocodone-acetaminophen]; and poison ivy extract [poison ivy extract].  Meds: Current Outpatient Prescriptions  Medication Sig Dispense Refill  . albuterol (PROVENTIL HFA;VENTOLIN HFA) 108 (90 BASE) MCG/ACT inhaler Inhale 2 puffs into the lungs every 6 (six) hours as needed for wheezing or shortness of breath. Reported on 07/15/2015    . amLODipine (NORVASC) 10 MG tablet  Take 10 mg by mouth daily.    Marland Kitchen buPROPion (WELLBUTRIN SR) 150 MG 12 hr tablet Take 150 mg by mouth 2 (two) times daily.    Marland Kitchen gabapentin (NEURONTIN) 100 MG capsule Take 100 mg by mouth daily.    Marland Kitchen guaifenesin (HUMIBID E) 400 MG TABS tablet Take 400 mg by mouth every 6 (six) hours as needed.    Marland Kitchen lisinopril (PRINIVIL,ZESTRIL) 20 MG tablet Take 20 mg by mouth daily.    Marland Kitchen loratadine (CLARITIN) 10 MG tablet Take 10 mg by mouth daily.    . pantoprazole (PROTONIX) 40 MG tablet Take 40 mg by mouth daily.    . sodium chloride 1 g tablet Take 1 g by mouth once.    Marland Kitchen ascorbic acid (VITAMIN C) 250 MG tablet Take 250 mg by mouth daily.    . nitroGLYCERIN (NITROSTAT) 0.4 MG SL tablet Place 1 tablet (0.4 mg total) under the tongue every 5 (five) minutes as needed for chest pain (do not take more than 3 tablets Nielsen a row). (Patient not taking: Reported on 12/07/2015) 30 tablet 0  . ranitidine (ZANTAC) 300 MG tablet Take 300 mg by mouth at bedtime.     No current facility-administered medications for this encounter.    Review of Systems: As above.  Physical Findings: Wt Readings from Last 3 Encounters:  12/07/16 161 lb (73 kg)  06/08/16 161 lb (73 kg)  12/07/15 159 lb 3.2 oz (72.2 kg)  height is 6' (1.829 m) and weight is 161 lb (73 kg). His temperature is 98.2 F (36.8 C). His blood pressure is 129/60 and his pulse is 78. His oxygen saturation is 100%.  General: Alert and oriented, Nielsen no acute distress. HEENT: He has bilateral hearing aids Nielsen place. Oropharynx is clear. He has a brown hairy coating to the middle of his tongue, not of clinical concern. Oropharyngeal mucosa is intact with no thrush or lesions. Neck: Lymphedema Nielsen the anterior neck. He has some thickening Nielsen the right supraclavicular fossa that extends into the right neck which feels like fibrosis and not a clinical concern. No palpable lymphadenopathy Nielsen the neck.  Heart: Regular Nielsen rate and rhythm with no murmurs, rubs, or gallops. Chest:  Clear to auscultation bilaterally, with no rhonchi, wheezes, or rales. Abdomen: Soft, nontender, nondistended, with no rigidity or guarding. Extremities: Edema Nielsen bilateral ankles. Lymphatics: see Neck Exam Skin: No concerning lesions. Skin intact and smooth over neck.  Psychiatric: Judgment and insight are intact. Affect is appropriate.      Lab Findings: Lab Results  Component Value Date   WBC 8.5 11/01/2014   HGB 14.5 11/01/2014   HCT 41.9 11/01/2014   MCV 92.6 11/01/2014   PLT 224 11/01/2014    Lab Results  Component Value Date   TSH 0.705 11/01/2014    Radiographic Findings: As above.  Impression/Plan:    1) Head and Neck Cancer Status: NED   2) Nutritional Status: Weight stable PEG tube: no  3) Risk Factors: The patient has been educated about risk factors including alcohol and tobacco abuse; they understand that avoidance of tobacco is important to prevent recurrences as well as other cancers.   4) Swallowing: No acute issues.  5) Dental: Encouraged to continue regular followup with dentistry, and dental hygiene including fluoride rinses.  6) Thyroid function: Continue TSH testing at [facility]. Lab Results  Component Value Date   TSH 0.705 11/01/2014   7) NED. Follow-up with me Nielsen 1 year. I look forward to seeing him at Survivorship celebrations Nielsen the interim.  _____________________________________   George Gibson, MD  This document serves as a record of services personally performed by George Gibson, MD. It was created on her behalf by George Nielsen, a trained medical scribe. The creation of this record is based on the scribe's personal observations and the provider's statements to them. This document has been checked and approved by the attending provider.

## 2016-12-21 ENCOUNTER — Other Ambulatory Visit: Payer: Self-pay | Admitting: Radiation Oncology

## 2016-12-21 ENCOUNTER — Inpatient Hospital Stay
Admission: RE | Admit: 2016-12-21 | Discharge: 2016-12-21 | Disposition: A | Discharge status: Home / Self Care | Payer: Self-pay | Source: Ambulatory Visit | Attending: Radiation Oncology | Admitting: Radiation Oncology

## 2016-12-21 DIAGNOSIS — C801 Malignant (primary) neoplasm, unspecified: Secondary | ICD-10-CM

## 2017-05-03 ENCOUNTER — Other Ambulatory Visit: Payer: Self-pay

## 2017-05-03 NOTE — Patient Outreach (Signed)
[facility]) Care Management  05/03/2017  George Nielsen 1955/12/25 [id number]   Telephone call to review health risk assessment and screen for care management needs for  [employer]. Member states that he receives all of his care at the [place].  States that he does not have a local primary care provider.  States he gets his medications from the [place].  Denies any case management needs.  Given information on how to get a local [place] primary care provider and to call his concierge to assist him with getting a primary care provider. Member assessed with no further interventions needed. Peter Garter RN, [facility] Care Management Coordinator [place] Care Management [phone]

## 2017-05-28 DIAGNOSIS — Z0120 Encounter for dental examination and cleaning without abnormal findings: Secondary | ICD-10-CM | POA: Diagnosis not present

## 2017-06-14 NOTE — Progress Notes (Signed)
I'm so sorry.  I didn't realize I could receive messages through this system and just saw this tonight.  I'm not sure where things are with this pt given it was Sept 2017 when I saw him.  Again, I apologize.

## 2017-09-21 IMAGING — RF DG SWALLOWING FUNCTION
10 series · 19 of 24 positions shown · non-contrast
Comparison: None.

CLINICAL DATA: Dysphagia. History of tongue base cancer with
radiation.

EXAM:
MODIFIED BARIUM SWALLOW
TECHNIQUE: Different consistencies of barium were administered orally to the
patient by the Speech Pathologist. Imaging of the pharynx was
performed in the lateral projection.
FLUOROSCOPY TIME:  Fluoroscopy Time:  1 minutes 54 second
Radiation Exposure Index (if provided by the fluoroscopic device):
Number of Acquired Spot Images: 0

[Series 1: cp_standard · 0.35mm/px · 2 of 62 frames shown (1 of 10)]
[frame 5/62]
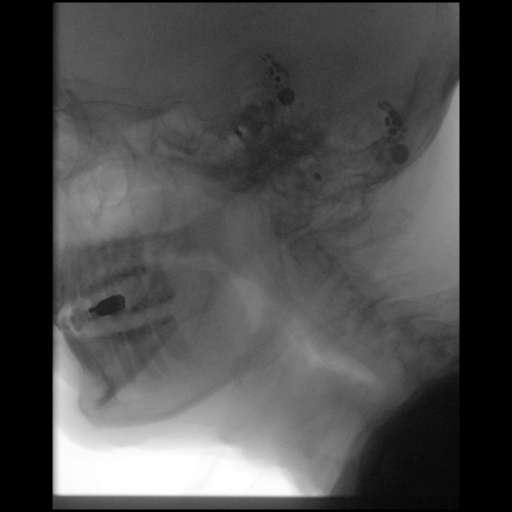
[frame 32/62]
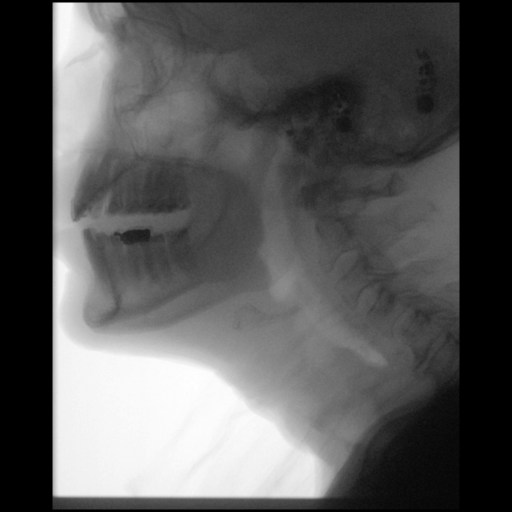

[Series 2: cp_standard · 0.35mm/px · 2 of 29 frames shown (2 of 10)]
[frame 5/29]
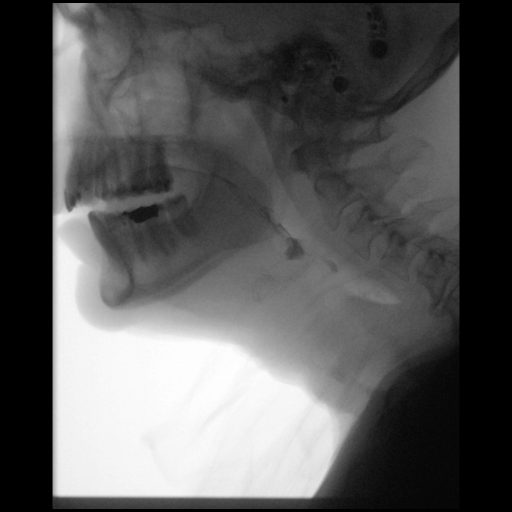
[frame 25/29]
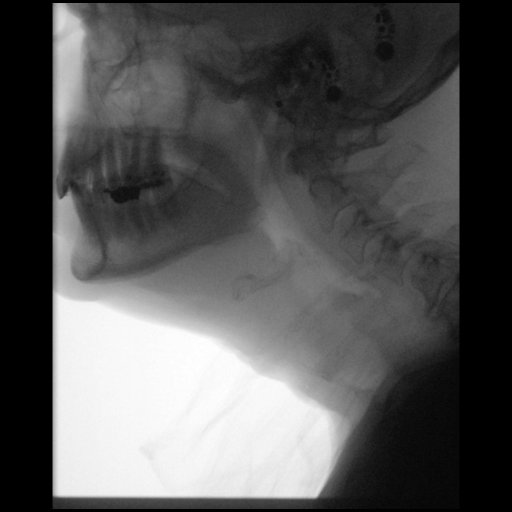

[Series 3: cp_standard · 0.35mm/px · 2 of 36 frames shown (3 of 10)]
[frame 6/36]
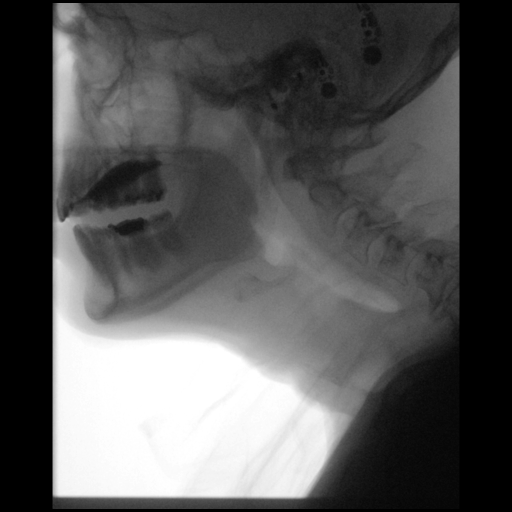
[frame 29/36]
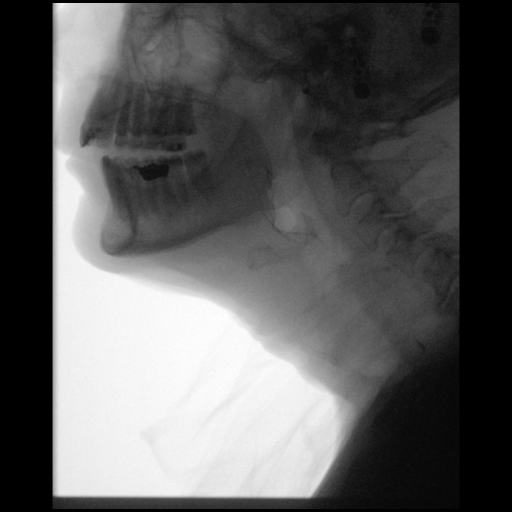

[Series 4: cp_standard · 0.35mm/px · 2 of 64 frames shown (4 of 10)]
[frame 33/64]
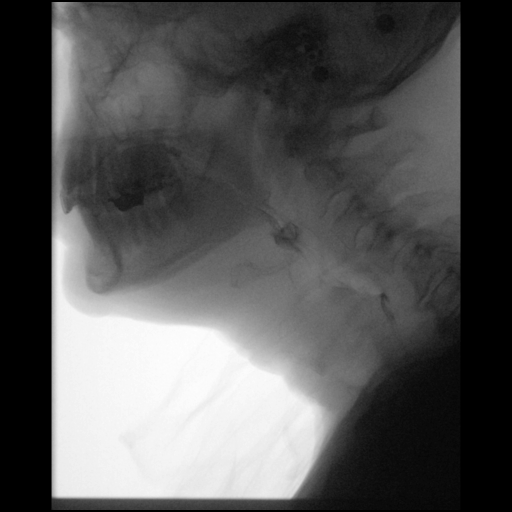
[frame 55/64]
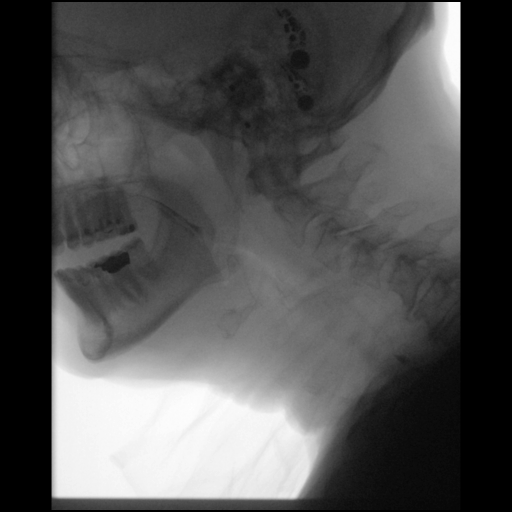

[Series 5: cp_standard · 0.35mm/px · 1 of 117 frames shown (5 of 10)]
[frame 23/117]
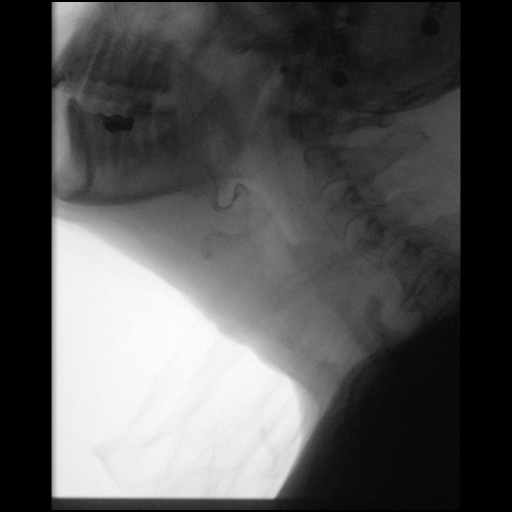

[Series 6: cp_standard · 0.35mm/px · 2 of 37 frames shown (6 of 10)]
[frame 6/37]
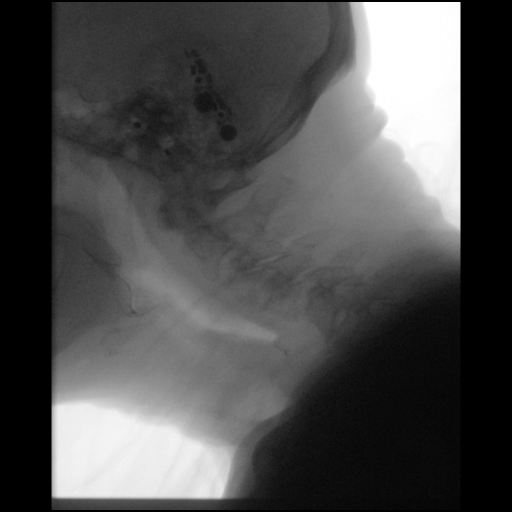
[frame 19/37]
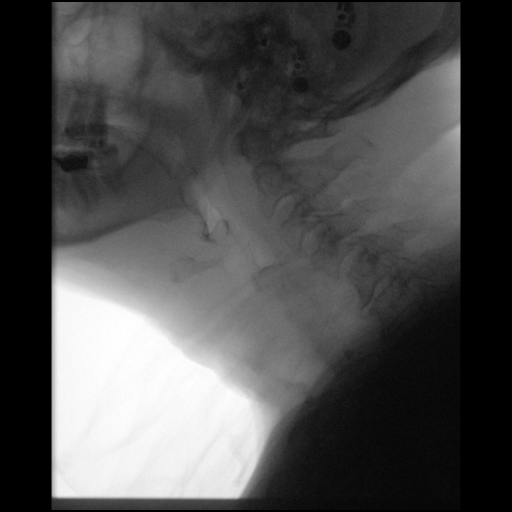

[Series 7: cp_standard · 0.35mm/px · 2 of 109 frames shown (7 of 10)]
[frame 17/109]
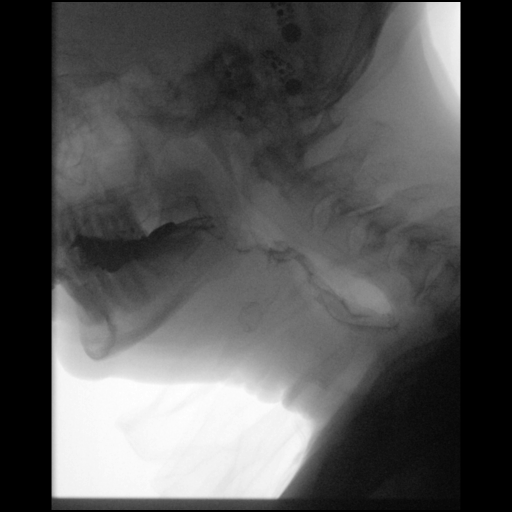
[frame 53/109]
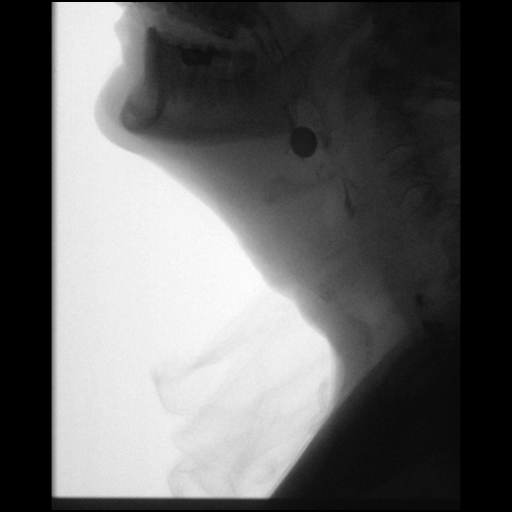

[Series 8: cp_standard · 0.35mm/px · 2 of 137 frames shown (8 of 10)]
[frame 37/137]
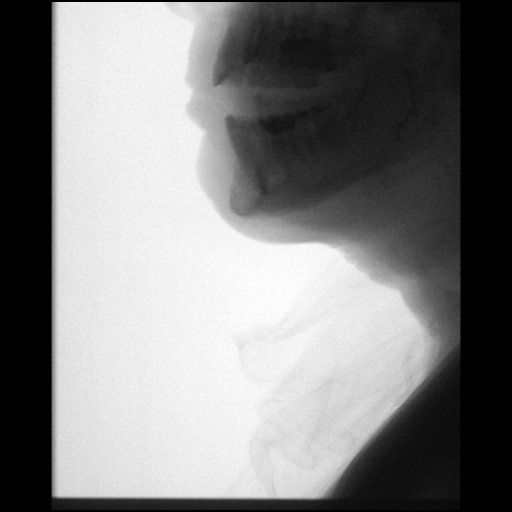
[frame 117/137]
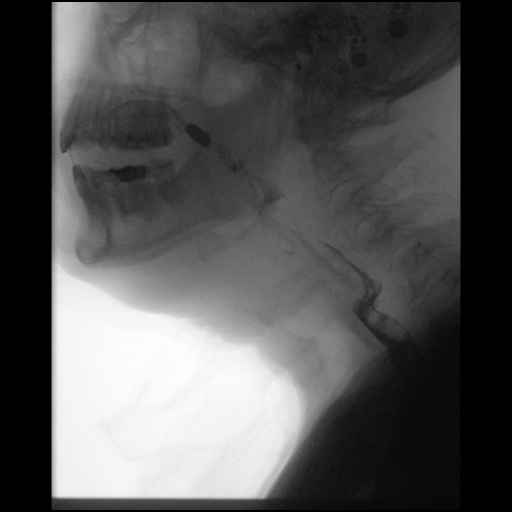

[Series 9: cp_standard · 0.35mm/px · 2 of 167 frames shown (9 of 10)]
[frame 26/167]
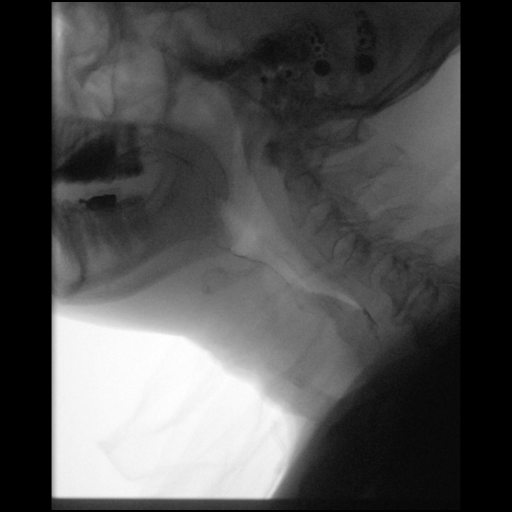
[frame 142/167]
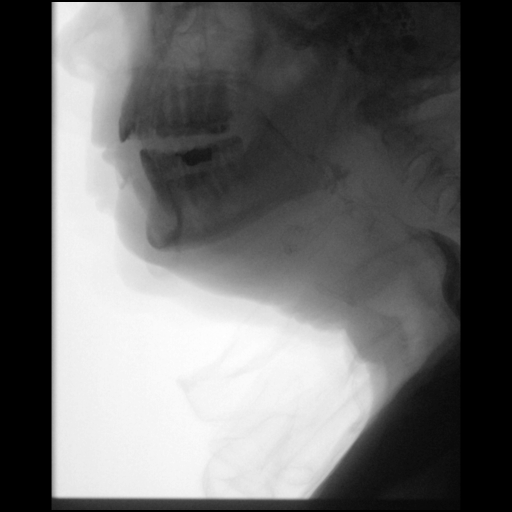

[Series 10: cp_standard · 0.35mm/px · 2 of 174 frames shown (10 of 10)]
[frame 88/174]
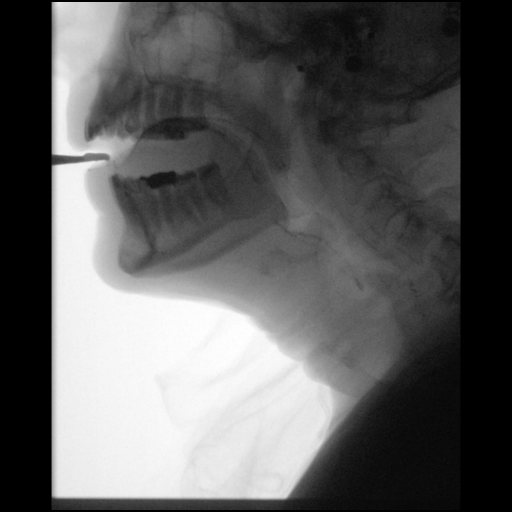
[frame 148/174]
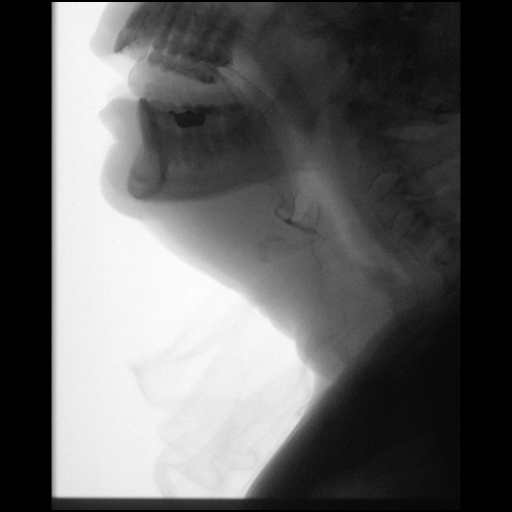

[19 of 24 positions shown; findings below may reference images not displayed]

FINDINGS: Thin liquid- normal swallowing with thin sips of thin liquid. With
chugging, there was flash penetration which elicited a cough.

Nectar thick liquid- small amount of retention which cleared with
additional swallow. Otherwise negative

Honey- not administered

Pashaa?Stubbs within normal limits

Cracker-difficulty with oral motor phase of swallowing. Otherwise
negative.

Pashaa?Klpigbb with cracker- within normal limits

Barium tablet - difficulty with the oral phase of swallowing. With
larger boluses of thin barium, barium tablet was successfully
swallowed.
IMPRESSION: Uncoordinated oral phase of swallowing.  Mild retention.

Mild penetration with chugging thin liquid. Otherwise no
penetration.

Please refer to the Speech Pathologists report for complete details
and recommendations.

## 2017-12-04 NOTE — Progress Notes (Addendum)
Mr. George Nielsen presents for follow up of radiation completed 12/29/14 to his base of tongue and bilateral neck.   Pain issues, if any: He denies Using a feeding tube?: No Weight changes, if any:  Wt Readings from Last 3 Encounters:  12/13/17 155 lb 12.8 oz (70.7 kg)  12/07/16 161 lb (73 kg)  06/08/16 161 lb (73 kg)   Swallowing issues, if any: he reports pills are getting stuck in his throat recently. He reports continued taste changes. He forces himself to eat at time.s  Smoking or chewing tobacco? No Using fluoride trays daily? Yes, twice daily.  Last ENT visit was on: [place] he reports 3 months ago. He sees Sera Jacob's.  Other notable issues, if any:   BP (!) 145/69 (BP Location: Right Arm)   Pulse 87   Temp 97.7 F (36.5 C) (Oral)   Resp 20   Wt 155 lb 12.8 oz (70.7 kg)   BMI 21.13 kg/m

## 2017-12-13 ENCOUNTER — Other Ambulatory Visit: Payer: Self-pay

## 2017-12-13 ENCOUNTER — Ambulatory Visit
Admission: RE | Admit: 2017-12-13 | Discharge: 2017-12-13 | Disposition: A | Discharge status: Home / Self Care | Payer: No Typology Code available for payment source | Source: Ambulatory Visit | Attending: Radiation Oncology | Admitting: Radiation Oncology

## 2017-12-13 ENCOUNTER — Encounter: Payer: Self-pay | Admitting: *Deleted

## 2017-12-13 ENCOUNTER — Encounter: Payer: Self-pay | Admitting: Radiation Oncology

## 2017-12-13 VITALS — BP 145/69 | HR 87 | Temp 97.7°F | Resp 20 | Wt 155.8 lb

## 2017-12-13 DIAGNOSIS — Z79899 Other long term (current) drug therapy: Secondary | ICD-10-CM | POA: Insufficient documentation

## 2017-12-13 DIAGNOSIS — Z08 Encounter for follow-up examination after completed treatment for malignant neoplasm: Secondary | ICD-10-CM | POA: Diagnosis not present

## 2017-12-13 DIAGNOSIS — C01 Malignant neoplasm of base of tongue: Secondary | ICD-10-CM | POA: Diagnosis present

## 2017-12-13 DIAGNOSIS — Z885 Allergy status to narcotic agent status: Secondary | ICD-10-CM | POA: Insufficient documentation

## 2017-12-13 DIAGNOSIS — Z85810 Personal history of malignant neoplasm of tongue: Secondary | ICD-10-CM | POA: Insufficient documentation

## 2017-12-13 DIAGNOSIS — Z923 Personal history of irradiation: Secondary | ICD-10-CM | POA: Diagnosis not present

## 2017-12-13 DIAGNOSIS — J449 Chronic obstructive pulmonary disease, unspecified: Secondary | ICD-10-CM | POA: Diagnosis not present

## 2017-12-13 NOTE — Progress Notes (Addendum)
Radiation Oncology         [phone]) [phone] ________________________________  Name: George Nielsen MRN: [id number]  Date: 12/13/2017  DOB: 03/12/1956  Follow-Up Visit Note  CC: Administration, Corrinne Eagle, Sera L, MD  Diagnosis and Prior Radiotherapy:       ICD-10-CM   1. Malignant neoplasm of base of tongue ([place]) C01    T3N2cM0 Stage IVA Base of Tongue Squamous Cell Carcinoma    Radiation treatment dates: 11/10/2014 - 12/29/2014 Site/dose: BOT and bilateral neck treated to 70 Gy in 35 fractions  Chief Complaint: Followup for base of tongue cancer  Narrative:  The patient returns today for routine follow-up of radiation completed 12/29/2014 to the base of his tongue and his bilateral neck. He notes that he is doing well overall. He is still taking thyroid supplement. He was recently placed on a Survivorship program through the [place] and he sees his Medical Oncologist on an annual basis. He still follows up with his ENT Specialist, Dr. Edison Nasuti every 3 months with the laryngoscopy procedure each visit. He has mentioned his swallowing issues with his pills to his ENT specialist. He denies issues with swallowing food. He hasn't tried swallowing his pills with applesauce or pudding. He has been advised of esophageal stretching procedures in the past, however, he was informed that it is temporary and he doesn't want a temporary fix at this time. He has tongue spasms every morning for approximately 5-10 minutes. He denies exercising at this time due to low energy and his hx of COPD. He is still wearing the Flexitouch mask every morning for 30 minutes. He does have a GI specialist as well.   Pain issues, if any: He denies. Using a feeding tube?: No. Weight changes, if any:  Wt Readings from Last 3 Encounters:  12/13/17 155 lb 12.8 oz (70.7 kg)  12/07/16 161 lb (73 kg)  06/08/16 161 lb (73 kg)   Swallowing issues, if any: He reports pills are getting stuck in his throat recently. He denies  issues with swallowing foods. He is still having taste changes.   Smoking or chewing tobacco? No.  Using fluoride trays daily? He is using fluoride trays twice daily.  Last ENT visit was on: About 3 months ago he saw Dr. Silvestre Moment ([place]). He has follow up with Dr. Ardis Hughs every 3 months.   Other notable issues, if any: persistent dry mouth   ALLERGIES:  is allergic to bee venom; vicodin [hydrocodone-acetaminophen]; and poison ivy extract [poison ivy extract].  Meds: Current Outpatient Medications  Medication Sig Dispense Refill  . albuterol (PROVENTIL HFA;VENTOLIN HFA) 108 (90 BASE) MCG/ACT inhaler Inhale 2 puffs into the lungs every 6 (six) hours as needed for wheezing or shortness of breath. Reported on 07/15/2015    . amLODipine (NORVASC) 10 MG tablet Take 10 mg by mouth daily.    Marland Kitchen ascorbic acid (VITAMIN C) 250 MG tablet Take 250 mg by mouth daily.    Marland Kitchen buPROPion (WELLBUTRIN SR) 150 MG 12 hr tablet Take 150 mg by mouth 2 (two) times daily.    Marland Kitchen gabapentin (NEURONTIN) 100 MG capsule Take 100 mg by mouth daily.    Marland Kitchen guaifenesin (HUMIBID E) 400 MG TABS tablet Take 400 mg by mouth every 6 (six) hours as needed.    Marland Kitchen levothyroxine (SYNTHROID, LEVOTHROID) 75 MCG tablet Take 75 mcg by mouth daily before breakfast.    . lisinopril (PRINIVIL,ZESTRIL) 20 MG tablet Take 20 mg by mouth daily.    Marland Kitchen  loratadine (CLARITIN) 10 MG tablet Take 10 mg by mouth daily.    . pantoprazole (PROTONIX) 40 MG tablet Take 40 mg by mouth daily.    . ranitidine (ZANTAC) 300 MG tablet Take 300 mg by mouth at bedtime.    . sodium chloride 1 g tablet Take 1 g by mouth once.    . tamsulosin (FLOMAX) 0.4 MG CAPS capsule Take 0.4 mg by mouth.    . nitroGLYCERIN (NITROSTAT) 0.4 MG SL tablet Place 1 tablet (0.4 mg total) under the tongue every 5 (five) minutes as needed for chest pain (do not take more than 3 tablets in a row). (Patient not taking: Reported on 12/07/2015) 30 tablet 0   No current facility-administered  medications for this encounter.    Review of Systems: As above.  Physical Findings: Wt Readings from Last 3 Encounters:  12/13/17 155 lb 12.8 oz (70.7 kg)  12/07/16 161 lb (73 kg)  06/08/16 161 lb (73 kg)    weight is 155 lb 12.8 oz (70.7 kg). His oral temperature is 97.7 F (36.5 C). His blood pressure is 145/69 (abnormal) and his pulse is 87. His respiration is 20.  General: Alert and oriented, in no acute distress. HEENT: He has bilateral hearing aids in place. Oropharynx and oral cavity is clear.  Neck: Mild lymphedema in the anterior neck. No palpable masses in his neck.  Hunched posture in neck. Heart: Regular in rate and rhythm with no murmurs, rubs, or gallops. Chest: Clear to auscultation bilaterally, with no rhonchi, wheezes, or rales. Abdomen: Soft, nontender, nondistended, with no rigidity or guarding. Extremities: Pitting edema in bilateral ankles. Lymphatics: see Neck Exam Skin: No concerning lesions. Skin intact and smooth over neck.  Psychiatric: Judgment and insight are intact. Affect is appropriate.     Lab Findings: Lab Results  Component Value Date   WBC 8.5 11/01/2014   HGB 14.5 11/01/2014   HCT 41.9 11/01/2014   MCV 92.6 11/01/2014   PLT 224 11/01/2014    Lab Results  Component Value Date   TSH 0.705 11/01/2014    Radiographic Findings: As above.  Impression/Plan:    1) Head and Neck Cancer Status: NED   2) Nutritional Status: Weight stable PEG tube: no   3) Risk Factors: The patient has been educated about risk factors including alcohol and tobacco abuse; they understand that avoidance of tobacco is important to prevent recurrences as well as other cancers.  4) Swallowing: Patient has some issues with swallowing pills, however, he is able to consume foods. Advised the patient to use sugar-free applesauce to aid with swallowing pills. Also advised the patient to reach out to his Pharmacist in regards to which pills can be crushed to aid with  swallowing. Swallows meals without issues.  5) Dental: Encouraged to continue regular followup with dentistry, and dental hygiene including fluoride rinses. Patient follows up every 3 months with his dentist.   6) Thyroid function: Continue TSH testing/Rxs at [facility]. Lab Results  Component Value Date   TSH 0.705 11/01/2014   7) Other: I will place a referral with Physical Therapy for strengthening exercises/rehabilitation.  Posture exercises/strengthening.  -Advised for the patient to continue with use of Biotene to aid with dry sensation to oral cavity in the morning along with use of OTC Xylimelts PRN.  -Patient was given an informational document that our care team curated for head and neck cancer survivors to help with symptom management,and overall post-treatment wellness. Phone number of Gayleen Orem, Therapist, sports, our  Head and Neck Oncology Navigator was provided in case any questions arise.  8) Follow up in Radiation Oncology PRN. Advised to continue follow up with Dr. Ardis Hughs and his Medical Oncologist as scheduled.    I spent over 15 minutes  face to face with the patient and more than 50% of that time was spent in counseling and/or coordination of care.  _____________________________________   Eppie Gibson, MD  This document serves as a record of services personally performed by Eppie Gibson, MD. It was created on her behalf by Rae Lips, a trained medical scribe. The creation of this record is based on the scribe's personal observations and the provider's statements to them. This document has been checked and approved by the attending provider.

## 2017-12-16 ENCOUNTER — Encounter: Payer: Self-pay | Admitting: Radiation Oncology

## 2017-12-16 ENCOUNTER — Other Ambulatory Visit: Payer: Self-pay | Admitting: Radiation Oncology

## 2017-12-16 DIAGNOSIS — C01 Malignant neoplasm of base of tongue: Secondary | ICD-10-CM

## 2017-12-16 NOTE — Addendum Note (Signed)
Encounter addended by: Ezequiel Macauley, Stephani Police, RN on: 12/16/2017 9:02 AM  Actions taken: Sign clinical note

## 2017-12-16 NOTE — Progress Notes (Signed)
Oncology Nurse Navigator Documentation  Met with George Nielsen during post-tmt follow-up with Dr. Isidore Moos.  He completed chemRT for R BOT SCC 12/29/2014. Other than persistent xerostomia, he denied any lingering post-tmt SEs. He indicated he sees Dr. Edison Nasuti, [place], q3 months with laryngoscopy, [facility] annually.  Gayleen Orem, RN, BSN Head & Neck Oncology Nurse [place] at [place] [phone]

## 2017-12-20 ENCOUNTER — Telehealth: Payer: Self-pay | Admitting: *Deleted

## 2017-12-20 NOTE — Telephone Encounter (Signed)
Oncology Nurse Navigator Documentation  Returned Mr. Schwandt call.  He indicated [facility] approval needed for PT recommended by Dr. Isidore Moos per 9/20 f/u appt.  I explained referral/approval process will be initiated.  Gayleen Orem, RN, BSN Head & Neck Oncology Nurse [place] at [place] [phone]

## 2017-12-30 ENCOUNTER — Ambulatory Visit: Payer: No Typology Code available for payment source | Admitting: Physical Therapy

## 2017-12-31 ENCOUNTER — Encounter: Payer: Self-pay | Admitting: Radiation Oncology

## 2019-02-17 ENCOUNTER — Other Ambulatory Visit: Payer: Self-pay

## 2019-04-15 ENCOUNTER — Ambulatory Visit: Payer: PPO | Attending: Internal Medicine

## 2019-04-15 ENCOUNTER — Other Ambulatory Visit: Payer: Self-pay

## 2019-04-15 DIAGNOSIS — Z23 Encounter for immunization: Secondary | ICD-10-CM | POA: Insufficient documentation

## 2019-04-15 NOTE — Progress Notes (Signed)
   Covid-19 Vaccination Clinic  Name:  George Nielsen    MRN: [id number] DOB: 1955-07-15  04/15/2019  George Nielsen was observed post Covid-19 immunization for 15 minutes without incidence. He was provided with Vaccine Information Sheet and instruction to access the V-Safe system.   George Nielsen was instructed to call 911 with any severe reactions post vaccine: Marland Kitchen Difficulty breathing  . Swelling of your face and throat  . A fast heartbeat  . A bad rash all over your body  . Dizziness and weakness    Immunizations Administered    Name Date Dose VIS Date Route   Pfizer COVID-19 Vaccine 04/15/2019  1:38 PM 0.3 mL 03/06/2019 Intramuscular   Manufacturer: [place]   Lot: [id number]   [place]: [id number]

## 2019-04-25 ENCOUNTER — Ambulatory Visit: Payer: No Typology Code available for payment source

## 2019-05-06 ENCOUNTER — Ambulatory Visit: Payer: PPO | Attending: Internal Medicine

## 2019-05-06 ENCOUNTER — Ambulatory Visit: Payer: No Typology Code available for payment source

## 2019-05-06 DIAGNOSIS — Z23 Encounter for immunization: Secondary | ICD-10-CM

## 2019-05-06 NOTE — Progress Notes (Signed)
   Covid-19 Vaccination Clinic  Name:  TOCHI ACRES    MRN: [id number] DOB: 30-Apr-1955  05/06/2019  Mr. Gatewood was observed post Covid-19 immunization for 15 minutes without incidence. He was provided with Vaccine Information Sheet and instruction to access the V-Safe system.   Mr. Bellar was instructed to call 911 with any severe reactions post vaccine: Marland Kitchen Difficulty breathing  . Swelling of your face and throat  . A fast heartbeat  . A bad rash all over your body  . Dizziness and weakness    Immunizations Administered    Name Date Dose VIS Date Route   Pfizer COVID-19 Vaccine 05/06/2019 12:21 PM 0.3 mL 03/06/2019 Intramuscular   Manufacturer: [employer]   Lot: [id number]   [place]: [id number]

## 2019-06-15 DIAGNOSIS — H2513 Age-related nuclear cataract, bilateral: Secondary | ICD-10-CM | POA: Diagnosis not present

## 2019-08-13 ENCOUNTER — Encounter: Payer: Self-pay | Admitting: Nurse Practitioner

## 2019-08-26 ENCOUNTER — Encounter: Payer: Self-pay | Admitting: Nurse Practitioner

## 2019-09-04 ENCOUNTER — Ambulatory Visit: Payer: No Typology Code available for payment source | Admitting: Nurse Practitioner

## 2019-09-08 ENCOUNTER — Ambulatory Visit (INDEPENDENT_AMBULATORY_CARE_PROVIDER_SITE_OTHER): Payer: No Typology Code available for payment source | Admitting: Nurse Practitioner

## 2019-09-08 ENCOUNTER — Encounter: Payer: Self-pay | Admitting: Nurse Practitioner

## 2019-09-08 VITALS — BP 138/64 | HR 86 | Ht 70.0 in | Wt 152.2 lb

## 2019-09-08 DIAGNOSIS — R1310 Dysphagia, unspecified: Secondary | ICD-10-CM | POA: Diagnosis not present

## 2019-09-08 DIAGNOSIS — R195 Other fecal abnormalities: Secondary | ICD-10-CM | POA: Diagnosis not present

## 2019-09-08 MED ORDER — NA SULFATE-K SULFATE-MG SULF 17.5-3.13-1.6 GM/177ML PO SOLN: 1.0000 | Freq: Once | ORAL | 0 refills | Status: AC

## 2019-09-08 NOTE — Patient Instructions (Addendum)
If you are age 66 or older, your body mass index should be between 23-30. Your Body mass index is 21.85 kg/m. If this is out of the aforementioned range listed, please consider follow up with your Primary Care Provider.  If you are age 27 or younger, your body mass index should be between 19-25. Your Body mass index is 21.85 kg/m. If this is out of the aformentioned range listed, please consider follow up with your Primary Care Provider.   You have been scheduled for an endoscopy and colonoscopy. Please follow the written instructions given to you at your visit today. Please pick up your prep supplies at the pharmacy within the next 1-3 days. If you use inhalers (even only as needed), please bring them with you on the day of your procedure.  DO NOT take your Iron starting the week prior to your procedure. You may begin taking it once again after you have completed your procedure.   Follow up pending the results of your Colonoscopy/Endoscopy.

## 2019-09-08 NOTE — Progress Notes (Signed)
ASSESSMENT / PLAN:   64 year old male with history of stage IVA ( [id number]) tongue cancer status post radiation 4 to 5 years ago, COPD, HTN, HCV (treated), anemia, GERD  # Chronic dysphagia  --Possibly radiation induced esophageal stricture --Possibly Zenker's   --Wants EGD but doesn't want any esophageal dilations nor esophageal biopsies done.  Patient understands that EGD will therefore be only diagnostic, not therapeutic.  Without biopsies it may not even be diagnostic however  # Positive FIT --Never had a colonoscopy. Patient will be scheduled for a colonoscopy. The risks and benefits of colonoscopy with possible polypectomy / biopsies were discussed and the patient agrees to proceed.   # Normocytic anemia, on iron --Hemoglobin last month March was 10.7, normal MCV --Hold iron 7 days prior to colonoscopy  # History of hepatitis C, treated 3 years ago  # COPD --Does not require supplemental oxygen  # GERD --Asymptomatic on daily PPI   HPI:     Chief Complaint: Problem swallowing and positive fit test   George Nielsen is a 64 year old male referred by the [facility] for evaluation of a positive fit test and also for dysphagia.  I have recieved 35 pages of records from [place]  Patient gives a history of throat cancer and radiation treatment 4 to 5 years ago.  Cording to his records he had stage IV tongue base cancer . He began having problems swallowing while getting radiation.  Patient was seen and treated by speech therapy at [facility] a few years back.  Modified barium swallow study in September 2017 showed uncoordinated oral phase of swallowing, mild retention, mild penetration with choking of thin liquids. His symptoms did improve with therapy but now with recurrent/progressive problems.  He also describes a " small pocket" in his throat.    Patient was seen at the [facility] mid May with complaints of progressive dysphagia.  Also found to have a positive fit test for  colon cancer screening.  Patient says he has never had a colonoscopy.  The plan was for patient to have an EGD and colonoscopy through the [facility] but anesthesiology felt it should be done with anesthesia due to patient's severe COPD.  Patient says his COPD has never really been evaluated.  He does not have supplemental oxygen at home.  Family history colon cancer.  Data Reviewed:  Labs from [place] dated 05/27/2019 WBC 9.3, hemoglobin 10.7, MCV 92, platelets 271, MCV 92.8  Labs from [place] dated 08/11/2019 Hemoglobin 10.7 ALT 20, AST 21, total bilirubin 0.4 Remainder of LFTs not reported   Past Medical History:  Diagnosis Date  . Allergy   . COPD (chronic obstructive pulmonary disease) ([place])   . GERD (gastroesophageal reflux disease)   . Hepatitis C   . History of radiation therapy 11/10/14- 12/29/14   BOT and bilateral neck/ 70 Gy in 35 fractions to gross disease, 63 Gy in 35 fractions to high risk nodal echelons, and 56 Gy in 35 fraction to intermediate risk nodal echelons.   . Hypertension   . PNA (pneumonia)   . Ulcer      Past Surgical History:  Procedure Laterality Date  . BACK SURGERY    . SHOULDER SURGERY    . TONSILLECTOMY     Family History  Problem Relation Age of Onset  . Cancer Mother        breast  . Heart disease Father   . Colon cancer Neg Hx   .  Stomach cancer Neg Hx   . Pancreatic cancer Neg Hx    Social History   Tobacco Use  . Smoking status: Former Smoker    Packs/day: 0.50    Types: Cigarettes  . Smokeless tobacco: Never Used  . Tobacco comment: States he quit about 10 months ago-gwd 12/07/15  Vaping Use  . Vaping Use: Never used  Substance Use Topics  . Alcohol use: No  . Drug use: No   Current Outpatient Medications  Medication Sig Dispense Refill  . albuterol (PROVENTIL HFA;VENTOLIN HFA) 108 (90 BASE) MCG/ACT inhaler Inhale 2 puffs into the lungs every 6 (six) hours as needed for wheezing or shortness of breath. Reported on 07/15/2015    . amLODipine  (NORVASC) 10 MG tablet Take 10 mg by mouth daily.    Marland Kitchen buPROPion (WELLBUTRIN SR) 150 MG 12 hr tablet Take 150 mg by mouth 2 (two) times daily.    . ferrous sulfate 220 (44 Fe) MG/5ML solution Take 220 mg by mouth daily.    Marland Kitchen gabapentin (NEURONTIN) 100 MG capsule Take 100 mg by mouth daily.    Marland Kitchen ipratropium (ATROVENT) 0.02 % nebulizer solution Take 0.5 mg by nebulization 4 (four) times daily. Twice daily    . levothyroxine (SYNTHROID) 112 MCG tablet Take 112 mcg by mouth daily before breakfast.    . levothyroxine (SYNTHROID, LEVOTHROID) 75 MCG tablet Take 75 mcg by mouth daily before breakfast.    . lisinopril (PRINIVIL,ZESTRIL) 20 MG tablet Take 20 mg by mouth daily.    Marland Kitchen loratadine (CLARITIN) 10 MG tablet Take 10 mg by mouth daily.    . magnesium oxide (MAG-OX) 400 MG tablet Take 400 mg by mouth daily.    . meloxicam (MOBIC) 7.5 MG tablet Take 7.5 mg by mouth daily.    . methocarbamol (ROBAXIN) 500 MG tablet Take 500 mg by mouth 4 (four) times daily.    . pantoprazole (PROTONIX) 40 MG tablet Take 40 mg by mouth daily.    . sodium chloride 1 g tablet Take 1 g by mouth once.    . tamsulosin (FLOMAX) 0.4 MG CAPS capsule Take 0.4 mg by mouth.     No current facility-administered medications for this visit.   Allergies  Allergen Reactions  . Bee Venom Swelling  . Vicodin [Hydrocodone-Acetaminophen] Nausea And Vomiting  . Poison Ivy Extract [Poison Ivy Extract] Rash     Review of Systems: Positive for allergy, sinus problems, back pain, fatigue, hearing problems, itching and shortness of breath.  All other systems reviewed and negative except where noted in HPI.   Creatinine clearance cannot be calculated (Patient's most recent lab result is older than the maximum 21 days allowed.)   Physical Exam:    Wt Readings from Last 3 Encounters:  09/08/19 152 lb 4 oz (69.1 kg)  12/13/17 155 lb 12.8 oz (70.7 kg)  12/07/16 161 lb (73 kg)    BP 138/64   Pulse 86   Ht 5\' 10"  (1.778 m)   Wt  152 lb 4 oz (69.1 kg)   BMI 21.85 kg/m  Constitutional:  Pleasant male in no acute distress. Psychiatric: Normal mood and affect. Behavior is normal. EENT: Pupils normal.  Conjunctivae are normal. No scleral icterus. Neck supple.  Cardiovascular: Normal rate, regular rhythm. No edema Pulmonary/chest: Effort normal, faint bibasilar crackles.  Abdominal: Soft, nondistended, nontender. Bowel sounds active throughout. There are no masses palpable. No hepatomegaly. Neurological: Alert and oriented to person place and time. Skin: Skin is warm and dry.  No rashes noted.  Tye Savoy, NP  09/08/2019, 11:56 AM  Cc:  Referring Provider Administration, [facility]  George Nielsen

## 2019-09-09 NOTE — Progress Notes (Signed)
The patient and I discussed barium swallow in clinic yesterday.  I explained that this could help determine if there were any strictures, esophageal thickening, etc.  He asked to proceed with an EGD instead.  However, I will ask Beth to call the patient and tell him that you would like him to proceed with the barium swallow.  He is not scheduled for EGD colonoscopy until August so we have time to get this done.Thanks

## 2019-09-09 NOTE — Progress Notes (Signed)
Agree with assessment with the following thoughts. George Nielsen I think it would be good to get a barium swallow to get a sense of his anatomy / motility prior to EGD. If he doesn't want dilation / biopsies, then we may be able to get by with barium study only, but this may help with planning pending findings, if a dilation would be helpful / recommended pending findings

## 2019-09-10 ENCOUNTER — Telehealth: Payer: Self-pay

## 2019-09-10 ENCOUNTER — Other Ambulatory Visit: Payer: Self-pay

## 2019-09-10 DIAGNOSIS — R1310 Dysphagia, unspecified: Secondary | ICD-10-CM

## 2019-09-10 NOTE — Telephone Encounter (Signed)
Great, thanks [employer]

## 2019-09-10 NOTE — Telephone Encounter (Signed)
Called and left the patient a voicemail to call me back for information about the plan of care.

## 2019-09-10 NOTE — Telephone Encounter (Signed)
Spoke with the patient. He is agreeable to this plan. Imaging will be on 09/23/19 at [facility]. He will arrive at 10:15 am fasting for 3 hours prior.  Dr Havery Moros the patient wants you to know he has pictures of his "throat" that his ENT provider takes when he goes in for his every 3 month appointments. He was hoping that would be helpful.

## 2019-09-10 NOTE — Telephone Encounter (Signed)
Willia Craze, NP at 09/08/2019 11:00 AM  Status: Signed    The patient and I discussed barium swallow in clinic yesterday.  I explained that this could help determine if there were any strictures, esophageal thickening, etc.  He asked to proceed with an EGD instead.  However, I will ask Beth to call the patient and tell him that you would like him to proceed with the barium swallow.  He is not scheduled for EGD colonoscopy until August so we have time to get this done.Thanks    Armbruster, Carlota Raspberry, MD at 09/08/2019 11:00 AM  Status: Signed    Agree with assessment with the following thoughts. [name] I think it would be good to get a barium swallow to get a sense of his anatomy / motility prior to EGD. If he doesn't want dilation / biopsies, then we may be able to get by with barium study only, but this may help with planning pending findings, if a dilation would be helpful / recommended pending findings

## 2019-09-23 ENCOUNTER — Other Ambulatory Visit: Payer: Self-pay

## 2019-09-23 ENCOUNTER — Ambulatory Visit (HOSPITAL_COMMUNITY)
Admission: RE | Admit: 2019-09-23 | Discharge: 2019-09-23 | Disposition: A | Discharge status: Home / Self Care | Payer: PPO | Source: Ambulatory Visit | Attending: Nurse Practitioner | Admitting: Nurse Practitioner

## 2019-09-23 DIAGNOSIS — R1310 Dysphagia, unspecified: Secondary | ICD-10-CM | POA: Insufficient documentation

## 2019-10-01 ENCOUNTER — Telehealth: Payer: Self-pay

## 2019-10-01 NOTE — Telephone Encounter (Signed)
Left message for patient to please call back. 

## 2019-11-03 ENCOUNTER — Telehealth: Payer: Self-pay

## 2019-11-03 NOTE — Telephone Encounter (Signed)
Called and left detailed message at [phone] for pt that we will cancel his ECL scheduled for 8-19 in the [facility] and will place him on the wait list for the hospital.

## 2019-11-03 NOTE — Telephone Encounter (Signed)
-----   Message from Yetta Flock, MD sent at 11/03/2019  9:16 AM EDT ----- Regarding: FW: [place] pt Sheri and Jan, Please see below from Osvaldo Angst. This patient is scheduled for EGD and colonoscopy with me in the [place] in a few weeks, unfortunately per review of his chart he is not eligible for care at the [facility] given he has a difficult airway. Can you please contact him to let him know this, his case will need to be cancelled and rescheduled at the hospital. Unfortunately there is a wait to get in at the hospital and will need to figure out when this can be done, he should be added to the hospital list. Thanks   ----- Message ----- From: Osvaldo Angst, CRNA Sent: 11/02/2019   3:51 PM EDT To: Yetta Flock, MD Subject: [facility] pt                                         Dr. Havery Moros,  This pt appears to be a difficult intubation  and will need to have his procedure at the hospital.  Thanks,  Osvaldo Angst

## 2019-11-04 NOTE — Telephone Encounter (Signed)
Called patient back.  He is wanting to know if he can just have a colonoscopy and forego the EGD. He is not sure why he needs the EGD and is not interested in dilation given his history of throat /tongue base cancer and would like to discuss to better understand your recommendation and reasoning regarding EGD.

## 2019-11-04 NOTE — Telephone Encounter (Signed)
George Nielsen, Patient has an abnormal barium swallow and has problems swallowing. If he is still adamant about wanting nothing done about it then will cancel the EGD. Let's just keep him on for a colonoscopy. I will let Dr. Havery Moros know. .Thanks

## 2019-11-05 NOTE — Telephone Encounter (Signed)
Of course.  I apologize for the oversight and confusion.  Patient notified.  Please be aware, He is needing to have rotator cuff surgery on his Left shoulder and would like to have the colonoscopy before that surgery so the sooner the better.  I told him we will call him as soon as we have a date available.

## 2019-11-05 NOTE — Telephone Encounter (Signed)
Jan thanks for the note but please see initial message at the start of this message string. The patient is not eligible for procedures at the [facility] due to difficult airway per Osvaldo Angst. He cannot have the colonoscopy done at the [facility] per John's notes, he will need to be added to the hospital list, will have to discuss timing of this when I am back at the office next week. Thanks

## 2019-11-05 NOTE — Telephone Encounter (Signed)
Patient is returning your call.  

## 2019-11-05 NOTE — Telephone Encounter (Signed)
Called and spoke to patient.  He would like to have colonoscopy only next week.  Added him back to 8:30 am slot and told him to follow same instructions but he can come 1/2 later to arrive at 7:30am. He expressed understanding and was pleased we could get him back on the schedule for the same day.  Sent message to [employer] advising him of the change to colon only.

## 2019-11-06 NOTE — Telephone Encounter (Signed)
Thanks Jan, will talk soon about scheduling / logistics of this

## 2019-11-12 ENCOUNTER — Encounter: Payer: No Typology Code available for payment source | Admitting: Gastroenterology

## 2019-11-17 NOTE — Telephone Encounter (Signed)
Called and spoke to patient.  He was checking to see if Dr. Havery Moros has a date that he can scope him at the hospital.  I told him we would call him as soon as we have an availability. He is needing to schedule his rotator cuff surgery on his Left shoulder and wants to have the colonoscopy first.

## 2019-11-17 NOTE — Telephone Encounter (Signed)
Pt is requesting a call back from Jan

## 2019-11-18 NOTE — Telephone Encounter (Signed)
Called and left message for pt to call back to discuss Hospital Colon at [facility] on Wednesday, 9-29th.  Pt would need a driver to stay and drive home and would need to be Covid tested on Friday, 9-24. Need to hear back from the patient asap to secure the spot.

## 2019-11-18 NOTE — Telephone Encounter (Signed)
Okay let's look at the schedule and see what options are and get back to him

## 2019-11-19 ENCOUNTER — Other Ambulatory Visit: Payer: Self-pay

## 2019-11-19 DIAGNOSIS — R195 Other fecal abnormalities: Secondary | ICD-10-CM

## 2019-11-19 NOTE — Progress Notes (Signed)
Patient has an appt at the [place] that cannot be changed on 9-29. He is available on 01-06-20.  Scheduled him at 8:30 am to arrive at 7:00am With Dr. Bryan Lemma. He will come for a PV on 10-1  and be Covid tested on 10-9, Sat. Patient informed and agreeable.

## 2019-11-19 NOTE — Telephone Encounter (Signed)
Great thanks Jan 

## 2019-11-19 NOTE — Telephone Encounter (Signed)
Patient has an appt at the [place] that cannot be changed on 9-29. He is available on 01-06-20.  Scheduled him at 8:30 am to arrive at 7:00am With Dr. Bryan Lemma. He will come for a PV on 10-1  and be Covid tested on 10-9, Sat. Patient informed and agreeable.

## 2019-12-22 ENCOUNTER — Ambulatory Visit: Payer: No Typology Code available for payment source | Attending: Internal Medicine

## 2019-12-22 DIAGNOSIS — Z23 Encounter for immunization: Secondary | ICD-10-CM

## 2019-12-22 NOTE — Progress Notes (Signed)
   Covid-19 Vaccination Clinic  Name:  KALEO CONDREY    MRN: [id number] DOB: [birth year]  12/22/2019  Mr. Mckibben was observed post Covid-19 immunization for 15 minutes without incident. He was provided with Vaccine Information Sheet and instruction to access the V-Safe system.   Mr. Rodriques was instructed to call 911 with any severe reactions post vaccine: Marland Kitchen Difficulty breathing  . Swelling of face and throat  . A fast heartbeat  . A bad rash all over body  . Dizziness and weakness

## 2019-12-25 ENCOUNTER — Ambulatory Visit (AMBULATORY_SURGERY_CENTER): Payer: Self-pay | Admitting: *Deleted

## 2019-12-25 ENCOUNTER — Telehealth: Payer: Self-pay | Admitting: *Deleted

## 2019-12-25 ENCOUNTER — Other Ambulatory Visit: Payer: Self-pay

## 2019-12-25 VITALS — Ht 70.0 in | Wt 148.6 lb

## 2019-12-25 DIAGNOSIS — R195 Other fecal abnormalities: Secondary | ICD-10-CM

## 2019-12-25 NOTE — Progress Notes (Signed)
Pt has sample of Moviprep from the [place]; while explaining the instructions to take Moviprep, pt states "my stomach has shrunk so much from the cancer.  There is no way I can drink all that."  I showed him the [employer] and he states he "will try to get down as much as I can."  I encouraged him to just try his best in getting the most of his prep down that he can.  Pt is aware that care partner will wait in the car during procedure; if they feel like they will be too hot or cold to wait in the car; they may wait in the 4 th floor lobby. Patient is aware to bring only one care partner. We want them to wear a mask (we do not have any that we can provide them), practice social distancing, and we will check their temperatures when they get here.  I did remind the patient that their care partner needs to stay in the parking lot the entire time and have a cell phone available, we will call them when the pt is ready for discharge. Patient will wear mask into building.   No egg or soy allergy  No home oxygen use   No medications for weight loss taken  Pt denies constipation issues

## 2019-12-25 NOTE — Telephone Encounter (Signed)
Dr. Bryan Lemma,  This pt was seen today in [place]- he colonoscopy is at [facility] on 01-06-20. Just a couple of FYIs- he had Moviprep from the [place], but when I was explaining the instructions, he states, "my stomach is so shrunk up from having cancer, there is no way I can drink all of that."  I changed him to [facility]; he states he will try to get down as much as possible.  He always wanted me to let you know he has a torn rotator cuff on his left shoulder- he will need help laying on his side.   Just wanted to make you aware of these things, so no surprises the day of his procedure.  Thanks, [employer]

## 2019-12-25 NOTE — Telephone Encounter (Signed)
Thanks for the update. Agree with plan to change to low volume prep. Will take precautions regarding the shoulder intra-op. Thanks.

## 2020-01-02 ENCOUNTER — Other Ambulatory Visit (HOSPITAL_COMMUNITY)
Admission: RE | Admit: 2020-01-02 | Discharge: 2020-01-02 | Disposition: A | Discharge status: Home / Self Care | Payer: No Typology Code available for payment source | Source: Ambulatory Visit | Attending: Gastroenterology | Admitting: Gastroenterology

## 2020-01-02 DIAGNOSIS — Z01812 Encounter for preprocedural laboratory examination: Secondary | ICD-10-CM | POA: Insufficient documentation

## 2020-01-02 DIAGNOSIS — Z20822 Contact with and (suspected) exposure to covid-19: Secondary | ICD-10-CM | POA: Insufficient documentation

## 2020-01-02 LAB — SARS CORONAVIRUS 2 (TAT 6-24 HRS): SARS Coronavirus 2: NEGATIVE

## 2020-01-05 ENCOUNTER — Encounter (HOSPITAL_COMMUNITY): Payer: Self-pay | Admitting: Gastroenterology

## 2020-01-05 NOTE — Anesthesia Preprocedure Evaluation (Addendum)
Anesthesia Evaluation  Patient identified by MRN, date of birth, ID band Patient awake    Reviewed: Allergy & Precautions, NPO status , Patient's Chart, lab work & pertinent test results  Airway Mallampati: II  TM Distance: >3 FB Neck ROM: Limited   Comment: Trach scar Dental no notable dental hx.    Pulmonary sleep apnea (does not use CPAP) , COPD,  COPD inhaler, former smoker,    breath sounds clear to auscultation + decreased breath sounds      Cardiovascular Exercise Tolerance: Good METS: 3 - Mets hypertension,  Rhythm:Regular Rate:Normal  EKG reviewed   Neuro/Psych Anxiety negative neurological ROS     GI/Hepatic Neg liver ROS, GERD  ,  Endo/Other  Hypothyroidism   Renal/GU Renal disease     Musculoskeletal  (+) Arthritis ,   Abdominal   Peds  Hematology  (+) anemia ,   Anesthesia Other Findings H/o tongue base cancer s/p radiation s/p tracheotomy Kyphosis  Reproductive/Obstetrics negative OB ROS                           Anesthesia Physical Anesthesia Plan  ASA: III  Anesthesia Plan: MAC   Post-op Pain Management:    Induction:   PONV Risk Score and Plan: 1 and Propofol infusion, TIVA and Treatment may vary due to age or medical condition  Airway Management Planned: Natural Airway and Simple Face Mask  Additional Equipment:   Intra-op Plan:   Post-operative Plan:   Informed Consent: I have reviewed the patients History and Physical, chart, labs and discussed the procedure including the risks, benefits and alternatives for the proposed anesthesia with the patient or authorized representative who has indicated his/her understanding and acceptance.       Plan Discussed with: Anesthesiologist and CRNA  Anesthesia Plan Comments: (Discussed we may need to lighten sedation if any trouble maintaining his O2 sat or ventilation. He understands there is a risk of awareness.   If intubation were to be needed, glidescope would be used given his h/o chemoradiation and tracheostomy. Norton Blizzard, MD  )      Anesthesia Quick Evaluation

## 2020-01-06 ENCOUNTER — Ambulatory Visit (HOSPITAL_COMMUNITY)
Admission: RE | Admit: 2020-01-06 | Discharge: 2020-01-06 | Disposition: A | Discharge status: Home / Self Care | Payer: No Typology Code available for payment source | Attending: Gastroenterology | Admitting: Gastroenterology

## 2020-01-06 ENCOUNTER — Ambulatory Visit (HOSPITAL_COMMUNITY): Payer: No Typology Code available for payment source | Admitting: Certified Registered Nurse Anesthetist

## 2020-01-06 ENCOUNTER — Encounter (HOSPITAL_COMMUNITY): Payer: Self-pay | Admitting: Gastroenterology

## 2020-01-06 ENCOUNTER — Encounter (HOSPITAL_COMMUNITY)
Admission: RE | Disposition: A | Discharge status: Home / Self Care | Payer: Self-pay | Source: Home / Self Care | Attending: Gastroenterology

## 2020-01-06 DIAGNOSIS — J439 Emphysema, unspecified: Secondary | ICD-10-CM | POA: Insufficient documentation

## 2020-01-06 DIAGNOSIS — D122 Benign neoplasm of ascending colon: Secondary | ICD-10-CM | POA: Insufficient documentation

## 2020-01-06 DIAGNOSIS — I10 Essential (primary) hypertension: Secondary | ICD-10-CM | POA: Diagnosis not present

## 2020-01-06 DIAGNOSIS — K640 First degree hemorrhoids: Secondary | ICD-10-CM | POA: Diagnosis not present

## 2020-01-06 DIAGNOSIS — Z87891 Personal history of nicotine dependence: Secondary | ICD-10-CM | POA: Insufficient documentation

## 2020-01-06 DIAGNOSIS — D649 Anemia, unspecified: Secondary | ICD-10-CM | POA: Diagnosis not present

## 2020-01-06 DIAGNOSIS — K219 Gastro-esophageal reflux disease without esophagitis: Secondary | ICD-10-CM | POA: Insufficient documentation

## 2020-01-06 DIAGNOSIS — F419 Anxiety disorder, unspecified: Secondary | ICD-10-CM | POA: Insufficient documentation

## 2020-01-06 DIAGNOSIS — K635 Polyp of colon: Secondary | ICD-10-CM

## 2020-01-06 DIAGNOSIS — R1310 Dysphagia, unspecified: Secondary | ICD-10-CM | POA: Diagnosis not present

## 2020-01-06 DIAGNOSIS — K5730 Diverticulosis of large intestine without perforation or abscess without bleeding: Secondary | ICD-10-CM

## 2020-01-06 DIAGNOSIS — G4730 Sleep apnea, unspecified: Secondary | ICD-10-CM | POA: Insufficient documentation

## 2020-01-06 DIAGNOSIS — E039 Hypothyroidism, unspecified: Secondary | ICD-10-CM | POA: Insufficient documentation

## 2020-01-06 DIAGNOSIS — Z85810 Personal history of malignant neoplasm of tongue: Secondary | ICD-10-CM | POA: Diagnosis not present

## 2020-01-06 DIAGNOSIS — M40209 Unspecified kyphosis, site unspecified: Secondary | ICD-10-CM | POA: Insufficient documentation

## 2020-01-06 DIAGNOSIS — Q438 Other specified congenital malformations of intestine: Secondary | ICD-10-CM | POA: Diagnosis not present

## 2020-01-06 DIAGNOSIS — D125 Benign neoplasm of sigmoid colon: Secondary | ICD-10-CM | POA: Insufficient documentation

## 2020-01-06 DIAGNOSIS — M1990 Unspecified osteoarthritis, unspecified site: Secondary | ICD-10-CM | POA: Diagnosis not present

## 2020-01-06 DIAGNOSIS — R195 Other fecal abnormalities: Secondary | ICD-10-CM | POA: Diagnosis not present

## 2020-01-06 DIAGNOSIS — Z923 Personal history of irradiation: Secondary | ICD-10-CM | POA: Insufficient documentation

## 2020-01-06 DIAGNOSIS — K641 Second degree hemorrhoids: Secondary | ICD-10-CM

## 2020-01-06 HISTORY — PX: COLONOSCOPY WITH PROPOFOL: SHX5780

## 2020-01-06 HISTORY — PX: POLYPECTOMY: SHX5525

## 2020-01-06 HISTORY — PX: HEMOSTASIS CLIP PLACEMENT: SHX6857

## 2020-01-06 SURGERY — COLONOSCOPY WITH PROPOFOL
Anesthesia: Monitor Anesthesia Care

## 2020-01-06 MED ORDER — PROPOFOL 1000 MG/100ML IV EMUL
INTRAVENOUS | Status: AC
  Filled 2020-01-06: qty 200

## 2020-01-06 MED ORDER — SODIUM CHLORIDE 0.9 % IV SOLN: INTRAVENOUS | Status: DC

## 2020-01-06 MED ORDER — PROPOFOL 500 MG/50ML IV EMUL
INTRAVENOUS | Status: DC | PRN
  Administered 2020-01-06: 150 ug/kg/min via INTRAVENOUS

## 2020-01-06 MED ORDER — PROPOFOL 10 MG/ML IV BOLUS
INTRAVENOUS | Status: DC | PRN
  Administered 2020-01-06: 20 mg via INTRAVENOUS
  Administered 2020-01-06 (×2): 30 mg via INTRAVENOUS
  Administered 2020-01-06: 20 mg via INTRAVENOUS
  Administered 2020-01-06: 30 mg via INTRAVENOUS

## 2020-01-06 MED ORDER — PHENYLEPHRINE 40 MCG/ML (10ML) SYRINGE FOR IV PUSH (FOR BLOOD PRESSURE SUPPORT)
PREFILLED_SYRINGE | INTRAVENOUS | Status: DC | PRN
  Administered 2020-01-06: 80 ug via INTRAVENOUS
  Administered 2020-01-06: 120 ug via INTRAVENOUS

## 2020-01-06 MED ORDER — PROPOFOL 500 MG/50ML IV EMUL
INTRAVENOUS | Status: AC
  Filled 2020-01-06: qty 300

## 2020-01-06 MED ORDER — LACTATED RINGERS IV SOLN: INTRAVENOUS | Status: DC | PRN

## 2020-01-06 SURGICAL SUPPLY — 22 items

## 2020-01-06 NOTE — Discharge Instructions (Signed)

## 2020-01-06 NOTE — Transfer of Care (Signed)
Immediate Anesthesia Transfer of Care Note  Patient: George Nielsen  Procedure(s) Performed: COLONOSCOPY WITH PROPOFOL (N/A ) POLYPECTOMY  Patient Location: Endoscopy Unit  Anesthesia Type:MAC  Level of Consciousness: awake, alert  and oriented  Airway & Oxygen Therapy: Patient Spontanous Breathing and Patient connected to face mask oxygen  Post-op Assessment: Report given to RN and Post -op Vital signs reviewed and stable  Post vital signs: Reviewed and stable  Last Vitals:  Vitals Value Taken Time  BP 124/72   Temp    Pulse 77 01/06/20 0927  Resp 15 01/06/20 0927  SpO2 100 % 01/06/20 0927  Vitals shown include unvalidated device data.  Last Pain:  Vitals:   01/06/20 0805  TempSrc: Temporal  PainSc: 0-No pain         Complications: No complications documented.

## 2020-01-06 NOTE — Interval H&P Note (Signed)
History and Physical Interval Note:  01/06/2020 8:26 AM  George Nielsen  has presented today for surgery, with the diagnosis of positive fit test.  The various methods of treatment have been discussed with the patient and family. After consideration of risks, benefits and other options for treatment, the patient has consented to  Procedure(s): COLONOSCOPY WITH PROPOFOL (N/A) as a surgical intervention.  The patient's history has been reviewed, patient examined, no change in status, stable for surgery.  I have reviewed the patient's chart and labs.  Questions were answered to the patient's satisfaction.     Dominic Pea Marchello Rothgeb

## 2020-01-06 NOTE — Anesthesia Postprocedure Evaluation (Signed)
Anesthesia Post Note  Patient: George Nielsen  Procedure(s) Performed: COLONOSCOPY WITH PROPOFOL (N/A ) POLYPECTOMY     Patient location during evaluation: PACU Anesthesia Type: MAC Level of consciousness: awake and alert Pain management: pain level controlled Vital Signs Assessment: post-procedure vital signs reviewed and stable Respiratory status: spontaneous breathing and respiratory function stable Cardiovascular status: stable Postop Assessment: no apparent nausea or vomiting Anesthetic complications: no   No complications documented.  Last Vitals:  Vitals:   01/06/20 0945 01/06/20 0955  BP: (!) 119/51 (!) 149/64  Pulse: 73 74  Resp: 10 13  Temp:    SpO2: 100% 98%    Last Pain:  Vitals:   01/06/20 0945  TempSrc:   PainSc: 0-No pain                 Merlinda Frederick

## 2020-01-06 NOTE — Op Note (Signed)
[facility] Patient Name: George Nielsen Procedure Date: 01/06/2020 MRN: [id number] Attending MD: Gerrit Heck , MD Date of Birth: 04/20/1955 CSN: [id number] Age: 64 Admit Type: Outpatient Procedure:                Colonoscopy Indications:              This is the patient's first colonoscopy, Positive                            fecal immunochemical test Providers:                Gerrit Heck, MD, Jobe Igo, RN, Erenest Rasher, RN, Fransico Setters Mbumina, Technician Referring MD:              Medicines:                Monitored Anesthesia Care Complications:            No immediate complications. Estimated Blood Loss:     Estimated blood loss was minimal. Procedure:                Pre-Anesthesia Assessment:                           - Prior to the procedure, a History and Physical                            was performed, and patient medications and                            allergies were reviewed. The patient's tolerance of                            previous anesthesia was also reviewed. The risks                            and benefits of the procedure and the sedation                            options and risks were discussed with the patient.                            All questions were answered, and informed consent                            was obtained. Prior Anticoagulants: The patient has                            taken no previous anticoagulant or antiplatelet                            agents. ASA Grade Assessment: III - A patient with  severe systemic disease. After reviewing the risks                            and benefits, the patient was deemed in                            satisfactory condition to undergo the procedure.                           After obtaining informed consent, the colonoscope                            was passed under direct vision. Throughout the                             procedure, the patient's blood pressure, pulse, and                            oxygen saturations were monitored continuously. The                            CF-HQ190L ([id number]) Olympus colonoscope was                            introduced through the anus and advanced to the the                            cecum, identified by appendiceal orifice and                            ileocecal valve. The colonoscopy was technically                            difficult and complex due to significant looping                            and a tortuous colon. Successful completion of the                            procedure was aided by using manual pressure. The                            patient tolerated the procedure well. The quality                            of the bowel preparation was excellent. The                            ileocecal valve, appendiceal orifice, and rectum                            were photographed. Scope In: 8:38:06 AM Scope Out: 9:20:31 AM Scope Withdrawal Time: 0 hours 21 minutes 24 seconds  Total Procedure Duration: 0 hours 42  minutes 25 seconds  Findings:      The perianal and digital rectal examinations were normal.      A 15 mm polyp was found in the sigmoid colon. The polyp was       pedunculated. The polyp was removed with a hot snare. Resection and       retrieval were complete. For prophylaxis and to close a defect after       polypectomy, one hemostatic clip was successfully placed. There was no       bleeding during the procedure.      Five sessile polyps were found in the sigmoid colon (4) and ascending       colon. The polyps were 3 to 5 mm in size. These polyps were removed with       a cold snare. Resection and retrieval were complete. Estimated blood       loss was minimal.      A few small-mouthed diverticula were found in the ascending colon and       cecum.      Non-bleeding internal hemorrhoids were found during retroflexion. The        hemorrhoids were small and Grade I (internal hemorrhoids that do not       prolapse).      The sigmoid colon and hepatic flexure were significantly tortuous with       looping in the ascending colon. Advancing the scope required using       manual pressure. Impression:               - One 15 mm polyp in the sigmoid colon, removed                            with a hot snare. Resected and retrieved. Clip was                            placed.                           - Five 3 to 5 mm polyps in the sigmoid colon and in                            the ascending colon, removed with a cold snare.                            Resected and retrieved.                           - Diverticulosis in the ascending colon and in the                            cecum.                           - Non-bleeding internal hemorrhoids.                           - Tortuous colon. Moderate Sedation:      Not Applicable - Patient had care per Anesthesia. Recommendation:           -  Patient has a contact number available for                            emergencies. The signs and symptoms of potential                            delayed complications were discussed with the                            patient. Return to normal activities tomorrow.                            Written discharge instructions were provided to the                            patient.                           - Resume previous diet.                           - Continue present medications.                           - Await pathology results.                           - Repeat colonoscopy in 3 years for surveillance.                           - Return to GI office PRN. Procedure Code(s):        --- Professional ---                           [id number], Colonoscopy, flexible; with removal of                            tumor(s), polyp(s), or other lesion(s) by snare                            technique Diagnosis Code(s):        --- Professional  ---                           K63.5, Polyp of colon                           K64.0, First degree hemorrhoids                           R19.5, Other fecal abnormalities                           K57.30, Diverticulosis of large intestine without                            perforation or abscess without bleeding  Q43.8, Other specified congenital malformations of                            intestine CPT copyright 2019 American Medical Association. All rights reserved. The codes documented in this report are preliminary and upon coder review may  be revised to meet current compliance requirements. Gerrit Heck, MD 01/06/2020 9:27:14 AM Number of Addenda: 0

## 2020-01-06 NOTE — H&P (Signed)
    P  Chief Complaint:    Positive FIT test   HPI:     Patient is a 64 y.o. male with a history of COPD, base of tongue cancer  S/p XRT, OSA, HTN, referred from the [place] for colonoscopy due to recent positive FIT kit testing. No previous colonoscopy. Denies overt GI blood loss. Due to history of difficult airway, procedure scheduled at [facility], he has a history of dysphagia. Symptoms started during radiation 4-5 years ago, with some improvement after working with Speech Therapy. 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 However, patient declined EGD in the past, and again declines today.    Review of systems:     No chest pain, no SOB, no fevers, no urinary sx   Past Medical History:  Diagnosis Date  . Allergy   . Anemia   . Anxiety   . Arthritis   . Cancer (HCC)    tongue  . COPD (chronic obstructive pulmonary disease) ([place])   . Emphysema of lung ([place])   . GERD (gastroesophageal reflux disease)   . Hepatitis C   . History of radiation therapy 11/10/14- 12/29/14   BOT and bilateral neck/ 70 Gy in 35 fractions to gross disease, 63 Gy in 35 fractions to high risk nodal echelons, and 56 Gy in 35 fraction to intermediate risk nodal echelons.   . Hypertension   . PNA (pneumonia)   . Sleep apnea    wears CPAP sometimes per pt  . Thyroid disease    take Synthroid d/t scar tissue from radiation around thyroid  . Ulcer     Patient's surgical history, family medical history, social history, medications and allergies were all reviewed in Epic    Current Facility-Administered Medications  Medication Dose Route Frequency Provider Last Rate Last Admin  . 0.9 %  sodium chloride infusion   Intravenous Continuous Shanitra Phillippi V, DO        Physical Exam:     BP (!) 176/72   Pulse 89   Temp 98.4 F (36.9  C) (Temporal)   Resp 13   Ht $R'5\' 10"'MX$  (1.778 m)   Wt 68 kg   SpO2 99%   BMI 21.52 kg/m   GENERAL:  Pleasant male in NAD PSYCH: : Cooperative, normal affect EENT:  conjunctiva pink, mucous membranes moist, neck supple without masses CARDIAC:  RRR, no murmur heard, no peripheral edema PULM: Normal respiratory effort, lungs CTA bilaterally, no wheezing ABDOMEN:  Nondistended, soft, nontender. No obvious masses, no hepatomegaly,  normal bowel sounds SKIN:  turgor, no lesions seen Musculoskeletal:  Normal muscle tone, normal strength NEURO: Alert and oriented x 3, no focal neurologic deficits   IMPRESSION and PLAN:    1) Positive FIT test -Colonoscopy today  2) Dysphagia 3) History of radiation 4) Abnormal barium esophagram (cricopharyngeal bar, esophageal web) -Patient adamantly declines EGD. Discussed recent imaging results with him again today, but he again declines EGD -Encouraged to chew food thoroughly with plenty of liquids with meals, cut food to small pieces          Lavena Bullion ,DO, FACG 01/06/2020, 8:18 AM

## 2020-01-07 LAB — POCT I-STAT, CHEM 8
BUN: 12 mg/dL (ref 8–23)
Calcium, Ion: 1.13 mmol/L — ABNORMAL LOW (ref 1.15–1.40)
Chloride: 95 mmol/L — ABNORMAL LOW (ref 98–111)
Creatinine, Ser: 0.9 mg/dL (ref 0.61–1.24)
Glucose, Bld: 98 mg/dL (ref 70–99)
HCT: 36 % — ABNORMAL LOW (ref 39.0–52.0)
Hemoglobin: 12.2 g/dL — ABNORMAL LOW (ref 13.0–17.0)
Potassium: 4.2 mmol/L (ref 3.5–5.1)
Sodium: 135 mmol/L (ref 135–145)
TCO2: 28 mmol/L (ref 22–32)

## 2020-01-07 LAB — SURGICAL PATHOLOGY

## 2020-01-10 ENCOUNTER — Encounter (HOSPITAL_COMMUNITY): Payer: Self-pay | Admitting: Gastroenterology

## 2020-01-11 ENCOUNTER — Encounter: Payer: Self-pay | Admitting: Gastroenterology

## 2020-03-14 DIAGNOSIS — K121 Other forms of stomatitis: Secondary | ICD-10-CM | POA: Diagnosis not present

## 2020-04-26 ENCOUNTER — Other Ambulatory Visit: Payer: Self-pay | Admitting: Neurosurgery

## 2020-05-24 ENCOUNTER — Encounter (HOSPITAL_COMMUNITY): Payer: Self-pay

## 2020-05-24 ENCOUNTER — Other Ambulatory Visit: Payer: Self-pay

## 2020-05-24 ENCOUNTER — Encounter (HOSPITAL_COMMUNITY)
Admission: RE | Admit: 2020-05-24 | Discharge: 2020-05-24 | Disposition: A | Discharge status: Home / Self Care | Payer: No Typology Code available for payment source | Source: Ambulatory Visit | Attending: Neurosurgery | Admitting: Neurosurgery

## 2020-05-24 ENCOUNTER — Other Ambulatory Visit (HOSPITAL_COMMUNITY)
Admission: RE | Admit: 2020-05-24 | Discharge: 2020-05-24 | Disposition: A | Discharge status: Home / Self Care | Payer: No Typology Code available for payment source | Source: Ambulatory Visit | Attending: Neurosurgery | Admitting: Neurosurgery

## 2020-05-24 DIAGNOSIS — Z01812 Encounter for preprocedural laboratory examination: Secondary | ICD-10-CM | POA: Insufficient documentation

## 2020-05-24 DIAGNOSIS — Z20822 Contact with and (suspected) exposure to covid-19: Secondary | ICD-10-CM | POA: Insufficient documentation

## 2020-05-24 HISTORY — DX: Presence of cardiac pacemaker: Z95.0

## 2020-05-24 HISTORY — DX: Dyspnea, unspecified: R06.00

## 2020-05-24 HISTORY — DX: Hypothyroidism, unspecified: E03.9

## 2020-05-24 LAB — TYPE AND SCREEN
ABO/RH(D): O POS
Antibody Screen: NEGATIVE

## 2020-05-24 LAB — SURGICAL PCR SCREEN
MRSA, PCR: NEGATIVE
Staphylococcus aureus: NEGATIVE

## 2020-05-24 NOTE — Progress Notes (Addendum)
[facility] [place]  Cardiologist - no  Chest x-ray -   EKG - 01/06/2020  Stress Test -1   ECHO - 2014  Cardiac Cath - no  AICD-noPM-no LOOP-no  Sleep Study - yes CPAP - yes- cannot wear at this time due to cervical spine issuses  LABS-CBC, CMP, T/S, PCR  ASA-no  ERAS-no  HA1C-na  Anesthesia-  Pt denies having chest pain, sob, or fever at this time. All instructions explained to the pt, with a verbal understanding of the material. Pt agrees to go over the instructions while at home for a better understanding. Pt also instructed to self quarantine after being tested for COVID-19. The opportunity to ask questions was provided.

## 2020-05-24 NOTE — Pre-Procedure Instructions (Signed)
George Nielsen  05/24/2020    Your procedure is scheduled on Friday, March 4.  Report to [facility], Main Entrance or Entrance "A" at 6:30 AM.                Your surgery or procedure is scheduled to begin at 8:30 AM   Call this number if you have problems the morning of surgery: [phone]  This is the number for the Pre- Surgical Desk.                For any other questions, please call [phone], Monday - Friday 8 AM - 4 PM.   Remember:  Do not eat or drink after midnight Thursday, March 05/27/19   Take these medicines the morning of surgery with A SIP OF WATER: amLODipine (NORVASC)  buPROPion (WELLBUTRIN SR)  levothyroxine (SYNTHROID) loratadine (CLARITIN) pantoprazole (PROTONIX)  gabapentin (NEURONTIN)  sodium chloride (OCEAN) 0.65 % SOLN nasal spray  Take/use if needed: acetaminophen (TYLENOL) albuterol (PROVENTIL HFA;VENTOLIN HFA)  Inhaler; bring the inhaler with you ipratropium (ATROVENT)  nebulizer solution hydrOXYzine (ATARAX/VISTARIL)   1 Week prior to surgery STOP taking Aspirin, Aspirin Products (Goody Powder, Excedrin Migraine), Ibuprofen (Advil), Naproxen (Aleve), Vitamins and Herbal Products (ie Fish Oil).    Special instructions:  If you have a CPAP, please bring your mask with you.  [place]- Preparing For Surgery  Before surgery, you can play an important role. Because skin is not sterile, your skin needs to be as free of germs as possible. You can reduce the number of germs on your skin by washing with CHG (chlorahexidine gluconate) Soap before surgery.  CHG is an antiseptic cleaner which kills germs and bonds with the skin to continue killing germs even after washing.    Oral Hygiene is also important to reduce your risk of infection.  Remember - BRUSH YOUR TEETH THE MORNING OF SURGERY WITH YOUR REGULAR TOOTHPASTE  Please do not use if you have an allergy to CHG or antibacterial soaps. If your skin becomes reddened/irritated stop  using the CHG.  Do not shave (including legs and underarms) for at least 48 hours prior to first CHG shower. It is OK to shave your face.  Please follow these instructions carefully.   1. Shower the NIGHT BEFORE SURGERY and the MORNING OF SURGERY with CHG.   2. If you chose to wash your hair, wash your hair first as usual with your normal shampoo.  3. After you shampoo, wash your face and private area with the soap you use at home, then rinse your hair and body thoroughly to remove the shampoo and soap.  4. Use CHG as you would any other liquid soap. You can apply CHG directly to the skin and wash gently with a scrungie or a clean washcloth.   5. Apply the CHG Soap to your body ONLY FROM THE NECK DOWN.  Do not use on open wounds or open sores. Avoid contact with your eyes, ears, mouth and genitals (private parts).   6. Wash thoroughly, paying special attention to the area where your surgery will be performed.  7. Thoroughly rinse your body with warm water from the neck down.  8. DO NOT shower/wash with your normal soap after using and rinsing off the CHG Soap.  9. Pat yourself dry with a CLEAN TOWEL.  10. Wear CLEAN PAJAMAS to bed the night before surgery, wear comfortable clothes the morning of surgery  11. Place CLEAN SHEETS on  your bed the night of your first shower and DO NOT SLEEP WITH PETS.  Day of Surgery: Shower as instructed above. Do not apply any deodorants/lotions, powders or colognes.  Please wear clean clothes to the hospital/surgery center.   Remember to brush your teeth WITH YOUR REGULAR TOOTHPASTE.  Do not wear jewelry, make-up or nail polish.  Do not shave 48 hours prior to surgery.  Men may shave face and neck.  Do not bring valuables to the hospital.  [facility] is not responsible for any belongings or valuables.  Contacts, dentures or bridgework may not be worn into surgery.  Leave your suitcase in the car.  After surgery it may be brought to your  room.  For patients admitted to the hospital, discharge time will be determined by your treatment team.  Patients discharged the day of surgery will not be allowed to drive home.   Please read over the fact sheets that you were given.

## 2020-05-25 ENCOUNTER — Encounter (HOSPITAL_COMMUNITY): Payer: Self-pay

## 2020-05-25 LAB — SARS CORONAVIRUS 2 (TAT 6-24 HRS): SARS Coronavirus 2: NEGATIVE

## 2020-05-26 NOTE — Anesthesia Preprocedure Evaluation (Addendum)
Anesthesia Evaluation  Patient identified by MRN, date of birth, ID band Patient awake    Reviewed: Allergy & Precautions, NPO status , Patient's Chart, lab work & pertinent test results  History of Anesthesia Complications Negative for: history of anesthetic complications  Airway Mallampati: II  TM Distance: >3 FB Neck ROM: Full    Dental  (+) Missing, Dental Advisory Given,    Pulmonary sleep apnea (noncompliant with CPAP) , COPD,  COPD inhaler, former smoker,    Pulmonary exam normal        Cardiovascular hypertension, Pt. 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)  Reproductive/Obstetrics negative OB ROS                          Anesthesia Physical Anesthesia Plan  ASA: III  Anesthesia Plan: General   Post-op Pain Management:    Induction: Intravenous  PONV Risk Score and Plan: 3 and Treatment may vary due to age or medical condition, Midazolam, Dexamethasone and Ondansetron  Airway Management Planned: Oral ETT and Video Laryngoscope Planned  Additional Equipment: None  Intra-op Plan:   Post-operative Plan: Extubation in OR  Informed Consent: I have reviewed the patients History and Physical, chart, labs and discussed the procedure including the risks, benefits and alternatives for the proposed anesthesia with the patient or authorized representative who has indicated his/her understanding and acceptance.     Dental advisory given  Plan Discussed with: CRNA  Anesthesia Plan  Comments:        Anesthesia Quick Evaluation

## 2020-05-27 ENCOUNTER — Ambulatory Visit (HOSPITAL_COMMUNITY): Payer: No Typology Code available for payment source

## 2020-05-27 ENCOUNTER — Ambulatory Visit (HOSPITAL_COMMUNITY): Payer: No Typology Code available for payment source | Admitting: Anesthesiology

## 2020-05-27 ENCOUNTER — Ambulatory Visit (HOSPITAL_COMMUNITY): Payer: No Typology Code available for payment source | Admitting: Physician Assistant

## 2020-05-27 ENCOUNTER — Ambulatory Visit (HOSPITAL_COMMUNITY)
Admission: RE | Disposition: A | Discharge status: Home / Self Care | Payer: Self-pay | Source: Home / Self Care | Attending: Neurosurgery

## 2020-05-27 ENCOUNTER — Encounter (HOSPITAL_COMMUNITY): Payer: Self-pay | Admitting: Neurosurgery

## 2020-05-27 ENCOUNTER — Other Ambulatory Visit: Payer: Self-pay

## 2020-05-27 ENCOUNTER — Observation Stay (HOSPITAL_COMMUNITY)
Admission: RE | Admit: 2020-05-27 | Discharge: 2020-05-28 | Disposition: A | Discharge status: Home / Self Care | Payer: No Typology Code available for payment source | Attending: Neurosurgery | Admitting: Neurosurgery

## 2020-05-27 DIAGNOSIS — I10 Essential (primary) hypertension: Secondary | ICD-10-CM | POA: Insufficient documentation

## 2020-05-27 DIAGNOSIS — M4312 Spondylolisthesis, cervical region: Secondary | ICD-10-CM | POA: Diagnosis present

## 2020-05-27 DIAGNOSIS — Z859 Personal history of malignant neoplasm, unspecified: Secondary | ICD-10-CM | POA: Insufficient documentation

## 2020-05-27 DIAGNOSIS — M5001 Cervical disc disorder with myelopathy,  high cervical region: Secondary | ICD-10-CM | POA: Insufficient documentation

## 2020-05-27 DIAGNOSIS — M5000 Cervical disc disorder with myelopathy, unspecified cervical region: Secondary | ICD-10-CM | POA: Diagnosis present

## 2020-05-27 DIAGNOSIS — Z419 Encounter for procedure for purposes other than remedying health state, unspecified: Secondary | ICD-10-CM

## 2020-05-27 HISTORY — PX: ANTERIOR CERVICAL DECOMP/DISCECTOMY FUSION: SHX1161

## 2020-05-27 LAB — CBC
HCT: 32 % — ABNORMAL LOW (ref 39.0–52.0)
Hemoglobin: 10.5 g/dL — ABNORMAL LOW (ref 13.0–17.0)
MCH: 31 pg (ref 26.0–34.0)
MCHC: 32.8 g/dL (ref 30.0–36.0)
MCV: 94.4 fL (ref 80.0–100.0)
Platelets: 217 10*3/uL (ref 150–400)
RBC: 3.39 MIL/uL — ABNORMAL LOW (ref 4.22–5.81)
RDW: 13.7 % (ref 11.5–15.5)
WBC: 9.6 10*3/uL (ref 4.0–10.5)
nRBC: 0 % (ref 0.0–0.2)

## 2020-05-27 LAB — BASIC METABOLIC PANEL
Anion gap: 10 (ref 5–15)
BUN: 17 mg/dL (ref 8–23)
CO2: 25 mmol/L (ref 22–32)
Calcium: 8.9 mg/dL (ref 8.9–10.3)
Chloride: 99 mmol/L (ref 98–111)
Creatinine, Ser: 0.96 mg/dL (ref 0.61–1.24)
GFR, Estimated: >60 mL/min (ref >60)
Glucose, Bld: 99 mg/dL (ref 70–99)
Potassium: 4.2 mmol/L (ref 3.5–5.1)
Sodium: 134 mmol/L — ABNORMAL LOW (ref 135–145)

## 2020-05-27 LAB — ABO/RH: ABO/RH(D): O POS

## 2020-05-27 SURGERY — ANTERIOR CERVICAL DECOMPRESSION/DISCECTOMY FUSION 1 LEVEL
Anesthesia: General | Site: Spine Cervical

## 2020-05-27 MED ORDER — HEPARIN SODIUM (PORCINE) 5000 UNIT/ML IJ SOLN
5000.0000 [IU] | Freq: Three times a day (TID) | INTRAMUSCULAR | Status: DC
  Filled 2020-05-27: qty 1

## 2020-05-27 MED ORDER — GABAPENTIN 300 MG PO CAPS: 900.0000 mg | ORAL_CAPSULE | Freq: Every day | ORAL | Status: DC

## 2020-05-27 MED ORDER — LEVOTHYROXINE SODIUM 112 MCG PO TABS
112.0000 ug | ORAL_TABLET | Freq: Every day | ORAL | Status: DC
  Filled 2020-05-27: qty 1

## 2020-05-27 MED ORDER — LORATADINE 10 MG PO TABS: 10.0000 mg | ORAL_TABLET | Freq: Every day | ORAL | Status: DC

## 2020-05-27 MED ORDER — THROMBIN (RECOMBINANT) 5000 UNITS EX SOLR
CUTANEOUS | Status: AC
  Filled 2020-05-27: qty 10000

## 2020-05-27 MED ORDER — EPHEDRINE SULFATE-NACL 50-0.9 MG/10ML-% IV SOSY: PREFILLED_SYRINGE | INTRAVENOUS | Status: DC | PRN

## 2020-05-27 MED ORDER — LISINOPRIL 20 MG PO TABS: 20.0000 mg | ORAL_TABLET | Freq: Every day | ORAL | Status: DC

## 2020-05-27 MED ORDER — DIAZEPAM 5 MG PO TABS
5.0000 mg | ORAL_TABLET | Freq: Four times a day (QID) | ORAL | Status: DC | PRN
  Administered 2020-05-27 – 2020-05-28 (×2): 5 mg via ORAL
  Filled 2020-05-27 (×2): qty 1

## 2020-05-27 MED ORDER — ACETAMINOPHEN 500 MG PO TABS
1000.0000 mg | ORAL_TABLET | Freq: Once | ORAL | Status: DC
  Filled 2020-05-27: qty 2

## 2020-05-27 MED ORDER — FERROUS SULFATE 325 (65 FE) MG PO TABS
325.0000 mg | ORAL_TABLET | ORAL | Status: DC
  Filled 2020-05-27: qty 1

## 2020-05-27 MED ORDER — DEXAMETHASONE SODIUM PHOSPHATE 10 MG/ML IJ SOLN
INTRAMUSCULAR | Status: AC
  Filled 2020-05-27: qty 1

## 2020-05-27 MED ORDER — PHENYLEPHRINE 40 MCG/ML (10ML) SYRINGE FOR IV PUSH (FOR BLOOD PRESSURE SUPPORT): PREFILLED_SYRINGE | INTRAVENOUS | Status: DC | PRN

## 2020-05-27 MED ORDER — LACTATED RINGERS IV SOLN: INTRAVENOUS | Status: DC

## 2020-05-27 MED ORDER — ONDANSETRON HCL 4 MG PO TABS
4.0000 mg | ORAL_TABLET | Freq: Four times a day (QID) | ORAL | Status: DC | PRN
  Administered 2020-05-28: 4 mg via ORAL
  Filled 2020-05-27: qty 1

## 2020-05-27 MED ORDER — HEMOSTATIC AGENTS (NO CHARGE) OPTIME
TOPICAL | Status: DC | PRN
  Administered 2020-05-27 (×2): 1 via TOPICAL

## 2020-05-27 MED ORDER — FENTANYL CITRATE (PF) 100 MCG/2ML IJ SOLN
INTRAMUSCULAR | Status: AC
  Administered 2020-05-27: 50 ug via INTRAVENOUS
  Filled 2020-05-27: qty 2

## 2020-05-27 MED ORDER — ACETAMINOPHEN 650 MG RE SUPP: 650.0000 mg | RECTAL | Status: DC | PRN

## 2020-05-27 MED ORDER — OXYCODONE HCL ER 10 MG PO T12A
10.0000 mg | EXTENDED_RELEASE_TABLET | Freq: Two times a day (BID) | ORAL | Status: DC
  Administered 2020-05-27 (×2): 10 mg via ORAL
  Filled 2020-05-27 (×2): qty 1

## 2020-05-27 MED ORDER — ONDANSETRON HCL 4 MG/2ML IJ SOLN
INTRAMUSCULAR | Status: DC | PRN
  Administered 2020-05-27: 4 mg via INTRAVENOUS

## 2020-05-27 MED ORDER — OXYCODONE HCL 5 MG PO TABS
10.0000 mg | ORAL_TABLET | ORAL | Status: DC | PRN
  Administered 2020-05-27 – 2020-05-28 (×3): 10 mg via ORAL
  Filled 2020-05-27 (×3): qty 2

## 2020-05-27 MED ORDER — FENTANYL CITRATE (PF) 100 MCG/2ML IJ SOLN
25.0000 ug | INTRAMUSCULAR | Status: DC | PRN
  Administered 2020-05-27: 50 ug via INTRAVENOUS

## 2020-05-27 MED ORDER — GABAPENTIN 300 MG PO CAPS
300.0000 mg | ORAL_CAPSULE | Freq: Two times a day (BID) | ORAL | Status: DC
  Filled 2020-05-27: qty 1

## 2020-05-27 MED ORDER — PHENYLEPHRINE 40 MCG/ML (10ML) SYRINGE FOR IV PUSH (FOR BLOOD PRESSURE SUPPORT)
PREFILLED_SYRINGE | INTRAVENOUS | Status: DC | PRN
  Administered 2020-05-27: 120 ug via INTRAVENOUS
  Administered 2020-05-27: 80 ug via INTRAVENOUS

## 2020-05-27 MED ORDER — HEMOSTATIC AGENTS (NO CHARGE) OPTIME
TOPICAL | Status: DC | PRN
  Administered 2020-05-27: 1 via TOPICAL

## 2020-05-27 MED ORDER — CHLORHEXIDINE GLUCONATE 0.12 % MT SOLN
15.0000 mL | Freq: Once | OROMUCOSAL | Status: AC
  Administered 2020-05-27: 15 mL via OROMUCOSAL
  Filled 2020-05-27: qty 15

## 2020-05-27 MED ORDER — PANTOPRAZOLE SODIUM 40 MG PO TBEC: 40.0000 mg | DELAYED_RELEASE_TABLET | Freq: Two times a day (BID) | ORAL | Status: DC

## 2020-05-27 MED ORDER — SODIUM CHLORIDE 0.9 % IV SOLN: 250.0000 mL | INTRAVENOUS | Status: DC

## 2020-05-27 MED ORDER — DEXAMETHASONE SODIUM PHOSPHATE 10 MG/ML IJ SOLN
INTRAMUSCULAR | Status: DC | PRN
  Administered 2020-05-27: 10 mg via INTRAVENOUS

## 2020-05-27 MED ORDER — 0.9 % SODIUM CHLORIDE (POUR BTL) OPTIME
TOPICAL | Status: DC | PRN
  Administered 2020-05-27: 1000 mL

## 2020-05-27 MED ORDER — ACETAMINOPHEN 325 MG PO TABS
650.0000 mg | ORAL_TABLET | ORAL | Status: DC | PRN
  Administered 2020-05-27 – 2020-05-28 (×2): 650 mg via ORAL
  Filled 2020-05-27 (×2): qty 2

## 2020-05-27 MED ORDER — BUPROPION HCL ER (SR) 150 MG PO TB12
150.0000 mg | ORAL_TABLET | Freq: Two times a day (BID) | ORAL | Status: DC
  Filled 2020-05-27: qty 1

## 2020-05-27 MED ORDER — ONDANSETRON HCL 4 MG/2ML IJ SOLN
INTRAMUSCULAR | Status: AC
  Filled 2020-05-27: qty 2

## 2020-05-27 MED ORDER — PHENYLEPHRINE HCL-NACL 10-0.9 MG/250ML-% IV SOLN
INTRAVENOUS | Status: DC | PRN
  Administered 2020-05-27: 50 ug/min via INTRAVENOUS

## 2020-05-27 MED ORDER — EPHEDRINE 5 MG/ML INJ
INTRAVENOUS | Status: AC
  Filled 2020-05-27: qty 10

## 2020-05-27 MED ORDER — SODIUM CHLORIDE 0.9% FLUSH: 3.0000 mL | INTRAVENOUS | Status: DC | PRN

## 2020-05-27 MED ORDER — PROMETHAZINE HCL 25 MG/ML IJ SOLN: 6.2500 mg | INTRAMUSCULAR | Status: DC | PRN

## 2020-05-27 MED ORDER — MORPHINE SULFATE (PF) 2 MG/ML IV SOLN: 1.0000 mg | INTRAVENOUS | Status: DC | PRN

## 2020-05-27 MED ORDER — SODIUM CHLORIDE 1 G PO TABS
125.0000 g | ORAL_TABLET | Freq: Every day | ORAL | Status: DC
  Filled 2020-05-27: qty 125

## 2020-05-27 MED ORDER — PHENOL 1.4 % MT LIQD: 1.0000 | OROMUCOSAL | Status: DC | PRN

## 2020-05-27 MED ORDER — HYDROXYZINE HCL 10 MG PO TABS
10.0000 mg | ORAL_TABLET | Freq: Three times a day (TID) | ORAL | Status: DC | PRN
  Filled 2020-05-27: qty 1

## 2020-05-27 MED ORDER — PROPOFOL 10 MG/ML IV BOLUS
INTRAVENOUS | Status: DC | PRN
  Administered 2020-05-27: 140 mg via INTRAVENOUS

## 2020-05-27 MED ORDER — OXYCODONE HCL 5 MG/5ML PO SOLN: 5.0000 mg | Freq: Once | ORAL | Status: AC | PRN

## 2020-05-27 MED ORDER — IPRATROPIUM BROMIDE 0.06 % NA SOLN
1.0000 | Freq: Four times a day (QID) | NASAL | Status: DC
  Filled 2020-05-27: qty 15

## 2020-05-27 MED ORDER — ONDANSETRON HCL 4 MG/2ML IJ SOLN: 4.0000 mg | Freq: Four times a day (QID) | INTRAMUSCULAR | Status: DC | PRN

## 2020-05-27 MED ORDER — LIDOCAINE-EPINEPHRINE 0.5 %-1:200000 IJ SOLN
INTRAMUSCULAR | Status: DC | PRN
  Administered 2020-05-27: 2 mL

## 2020-05-27 MED ORDER — ROCURONIUM BROMIDE 10 MG/ML (PF) SYRINGE
PREFILLED_SYRINGE | INTRAVENOUS | Status: DC | PRN
  Administered 2020-05-27: 10 mg via INTRAVENOUS
  Administered 2020-05-27: 50 mg via INTRAVENOUS
  Administered 2020-05-27: 20 mg via INTRAVENOUS

## 2020-05-27 MED ORDER — MIDAZOLAM HCL 5 MG/5ML IJ SOLN
INTRAMUSCULAR | Status: DC | PRN
  Administered 2020-05-27: 2 mg via INTRAVENOUS

## 2020-05-27 MED ORDER — ALBUTEROL SULFATE (2.5 MG/3ML) 0.083% IN NEBU: 2.5000 mg | INHALATION_SOLUTION | Freq: Four times a day (QID) | RESPIRATORY_TRACT | Status: DC | PRN

## 2020-05-27 MED ORDER — FENTANYL CITRATE (PF) 100 MCG/2ML IJ SOLN
INTRAMUSCULAR | Status: DC | PRN
  Administered 2020-05-27 (×2): 50 ug via INTRAVENOUS
  Administered 2020-05-27: 100 ug via INTRAVENOUS
  Administered 2020-05-27: 50 ug via INTRAVENOUS

## 2020-05-27 MED ORDER — EPHEDRINE SULFATE-NACL 50-0.9 MG/10ML-% IV SOSY
PREFILLED_SYRINGE | INTRAVENOUS | Status: DC | PRN
  Administered 2020-05-27 (×2): 5 mg via INTRAVENOUS
  Administered 2020-05-27: 10 mg via INTRAVENOUS

## 2020-05-27 MED ORDER — LIDOCAINE-EPINEPHRINE 0.5 %-1:200000 IJ SOLN
INTRAMUSCULAR | Status: AC
  Filled 2020-05-27: qty 1

## 2020-05-27 MED ORDER — SODIUM FLUORIDE 1.1 % DT GEL: 1.0000 "application " | Freq: Two times a day (BID) | DENTAL | Status: DC

## 2020-05-27 MED ORDER — SUGAMMADEX SODIUM 200 MG/2ML IV SOLN
INTRAVENOUS | Status: DC | PRN
  Administered 2020-05-27: 200 mg via INTRAVENOUS

## 2020-05-27 MED ORDER — PROPOFOL 10 MG/ML IV BOLUS
INTRAVENOUS | Status: AC
  Filled 2020-05-27: qty 20

## 2020-05-27 MED ORDER — OXYCODONE HCL 5 MG PO TABS
ORAL_TABLET | ORAL | Status: AC
  Administered 2020-05-27: 5 mg via ORAL
  Filled 2020-05-27: qty 1

## 2020-05-27 MED ORDER — LIDOCAINE 2% (20 MG/ML) 5 ML SYRINGE
INTRAMUSCULAR | Status: DC | PRN
  Administered 2020-05-27: 80 mg via INTRAVENOUS

## 2020-05-27 MED ORDER — LIDOCAINE 2% (20 MG/ML) 5 ML SYRINGE
INTRAMUSCULAR | Status: AC
  Filled 2020-05-27: qty 5

## 2020-05-27 MED ORDER — OXYCODONE HCL 5 MG PO TABS: 5.0000 mg | ORAL_TABLET | Freq: Once | ORAL | Status: AC | PRN

## 2020-05-27 MED ORDER — CHLORHEXIDINE GLUCONATE CLOTH 2 % EX PADS: 6.0000 | MEDICATED_PAD | Freq: Once | CUTANEOUS | Status: DC

## 2020-05-27 MED ORDER — ORAL CARE MOUTH RINSE: 15.0000 mL | Freq: Once | OROMUCOSAL | Status: AC

## 2020-05-27 MED ORDER — MIDAZOLAM HCL 2 MG/2ML IJ SOLN
INTRAMUSCULAR | Status: AC
  Filled 2020-05-27: qty 2

## 2020-05-27 MED ORDER — SODIUM CHLORIDE 0.9% FLUSH: 3.0000 mL | Freq: Two times a day (BID) | INTRAVENOUS | Status: DC

## 2020-05-27 MED ORDER — SALINE SPRAY 0.65 % NA SOLN
1.0000 | Freq: Three times a day (TID) | NASAL | Status: DC
  Filled 2020-05-27: qty 44

## 2020-05-27 MED ORDER — MAGNESIUM OXIDE 400 (241.3 MG) MG PO TABS
200.0000 mg | ORAL_TABLET | Freq: Every day | ORAL | Status: DC
Start: 2020-05-27 — End: 2020-05-28
  Filled 2020-05-27: qty 1

## 2020-05-27 MED ORDER — POTASSIUM CHLORIDE IN NACL 20-0.9 MEQ/L-% IV SOLN: INTRAVENOUS | Status: DC

## 2020-05-27 MED ORDER — CHLORHEXIDINE GLUCONATE 0.12 % MT SOLN
15.0000 mL | Freq: Two times a day (BID) | OROMUCOSAL | Status: DC
  Filled 2020-05-27 (×2): qty 15

## 2020-05-27 MED ORDER — AMLODIPINE BESYLATE 5 MG PO TABS: 10.0000 mg | ORAL_TABLET | Freq: Every day | ORAL | Status: DC

## 2020-05-27 MED ORDER — PHENYLEPHRINE 40 MCG/ML (10ML) SYRINGE FOR IV PUSH (FOR BLOOD PRESSURE SUPPORT)
PREFILLED_SYRINGE | INTRAVENOUS | Status: AC
  Filled 2020-05-27: qty 10

## 2020-05-27 MED ORDER — DEXMEDETOMIDINE (PRECEDEX) IN NS 20 MCG/5ML (4 MCG/ML) IV SYRINGE
PREFILLED_SYRINGE | INTRAVENOUS | Status: AC
  Filled 2020-05-27: qty 5

## 2020-05-27 MED ORDER — CEFAZOLIN SODIUM-DEXTROSE 2-4 GM/100ML-% IV SOLN
2.0000 g | INTRAVENOUS | Status: AC
  Administered 2020-05-27: 2 g via INTRAVENOUS
  Filled 2020-05-27: qty 100

## 2020-05-27 MED ORDER — MENTHOL 3 MG MT LOZG: 1.0000 | LOZENGE | OROMUCOSAL | Status: DC | PRN

## 2020-05-27 MED ORDER — TAMSULOSIN HCL 0.4 MG PO CAPS: 0.8000 mg | ORAL_CAPSULE | Freq: Every day | ORAL | Status: DC

## 2020-05-27 MED ORDER — SENNA 8.6 MG PO TABS
1.0000 | ORAL_TABLET | Freq: Two times a day (BID) | ORAL | Status: DC
  Filled 2020-05-27: qty 1

## 2020-05-27 MED ORDER — OXYCODONE HCL 5 MG PO TABS
5.0000 mg | ORAL_TABLET | ORAL | Status: DC | PRN
  Administered 2020-05-28: 5 mg via ORAL
  Filled 2020-05-27: qty 1

## 2020-05-27 MED ORDER — FENTANYL CITRATE (PF) 250 MCG/5ML IJ SOLN
INTRAMUSCULAR | Status: AC
  Filled 2020-05-27: qty 5

## 2020-05-27 MED ORDER — IPRATROPIUM BROMIDE 0.02 % IN SOLN
0.5000 mg | Freq: Four times a day (QID) | RESPIRATORY_TRACT | Status: DC | PRN
  Filled 2020-05-27: qty 2.5

## 2020-05-27 SURGICAL SUPPLY — 55 items
ADH SKN CLS APL DERMABOND .7 (GAUZE/BANDAGES/DRESSINGS) ×1
ALLOGRAFT 7X14X11 (Bone Implant) ×1 IMPLANT
BAND INSRT 18 STRL LF DISP RB (MISCELLANEOUS) ×2
BAND RUBBER #18 3X1/16 STRL (MISCELLANEOUS) ×4 IMPLANT
BLADE CLIPPER SURG (BLADE) IMPLANT
BUR DRUM 4.0 (BURR) ×2 IMPLANT
BUR MATCHSTICK NEURO 3.0 LAGG (BURR) ×2 IMPLANT
CANISTER SUCT 3000ML PPV (MISCELLANEOUS) ×2 IMPLANT
CARTRIDGE OIL MAESTRO DRILL (MISCELLANEOUS) ×1 IMPLANT
COVER WAND RF STERILE (DRAPES) ×1 IMPLANT
DECANTER SPIKE VIAL GLASS SM (MISCELLANEOUS) ×2 IMPLANT
DERMABOND ADVANCED (GAUZE/BANDAGES/DRESSINGS) ×1
DERMABOND ADVANCED .7 DNX12 (GAUZE/BANDAGES/DRESSINGS) ×1 IMPLANT
DIFFUSER DRILL AIR PNEUMATIC (MISCELLANEOUS) ×2 IMPLANT
DRAPE HALF SHEET 40X57 (DRAPES) IMPLANT
DRAPE LAPAROTOMY 100X72 PEDS (DRAPES) ×2 IMPLANT
DRAPE MICROSCOPE LEICA (MISCELLANEOUS) ×2 IMPLANT
DRAPE SHEET LG 3/4 BI-LAMINATE (DRAPES) ×1 IMPLANT
DURAPREP 6ML APPLICATOR 50/CS (WOUND CARE) ×2 IMPLANT
ELECT COATED BLADE 2.86 ST (ELECTRODE) ×2 IMPLANT
ELECT REM PT RETURN 9FT ADLT (ELECTROSURGICAL) ×2
ELECTRODE REM PT RTRN 9FT ADLT (ELECTROSURGICAL) ×1 IMPLANT
GAUZE 4X4 16PLY RFD (DISPOSABLE) IMPLANT
GLOVE ECLIPSE 6.5 STRL STRAW (GLOVE) ×2 IMPLANT
GLOVE ECLIPSE 9.0 STRL (GLOVE) ×1 IMPLANT
GLOVE EXAM NITRILE XL STR (GLOVE) IMPLANT
GLOVE SURG POLYISO LF SZ7 (GLOVE) IMPLANT
GLOVE SURG SS PI 7.5 STRL IVOR (GLOVE) ×7 IMPLANT
GOWN STRL REUS W/ TWL LRG LVL3 (GOWN DISPOSABLE) ×2 IMPLANT
GOWN STRL REUS W/ TWL XL LVL3 (GOWN DISPOSABLE) IMPLANT
GOWN STRL REUS W/TWL 2XL LVL3 (GOWN DISPOSABLE) IMPLANT
GOWN STRL REUS W/TWL LRG LVL3 (GOWN DISPOSABLE) ×8
GOWN STRL REUS W/TWL XL LVL3 (GOWN DISPOSABLE) ×2
KIT BASIN OR (CUSTOM PROCEDURE TRAY) ×2 IMPLANT
KIT TURNOVER KIT B (KITS) ×2 IMPLANT
NDL HYPO 25X1 1.5 SAFETY (NEEDLE) ×1 IMPLANT
NDL SPNL 22GX3.5 QUINCKE BK (NEEDLE) ×1 IMPLANT
NEEDLE HYPO 25X1 1.5 SAFETY (NEEDLE) ×2 IMPLANT
NEEDLE SPNL 22GX3.5 QUINCKE BK (NEEDLE) ×2 IMPLANT
NS IRRIG 1000ML POUR BTL (IV SOLUTION) ×2 IMPLANT
OIL CARTRIDGE MAESTRO DRILL (MISCELLANEOUS) ×2
PACK LAMINECTOMY NEURO (CUSTOM PROCEDURE TRAY) ×2 IMPLANT
PAD ARMBOARD 7.5X6 YLW CONV (MISCELLANEOUS) ×6 IMPLANT
PIN DISTRACTION 14MM (PIN) IMPLANT
PLATE ACP 1-LEVEL 1.6V20 (Plate) ×1 IMPLANT
SCREW ACP 3.5 X 13 S/D VARIA (Screw) ×8 IMPLANT
SCREW ACP 3.5X13 S/D VAR ANGLE (Screw) IMPLANT
SPONGE INTESTINAL PEANUT (DISPOSABLE) ×3 IMPLANT
SPONGE SURGIFOAM ABS GEL SZ50 (HEMOSTASIS) ×2 IMPLANT
SUT VIC AB 0 CT1 27 (SUTURE) ×2
SUT VIC AB 0 CT1 27XBRD ANTBC (SUTURE) IMPLANT
SUT VIC AB 3-0 SH 8-18 (SUTURE) ×3 IMPLANT
TOWEL GREEN STERILE (TOWEL DISPOSABLE) ×2 IMPLANT
TOWEL GREEN STERILE FF (TOWEL DISPOSABLE) ×2 IMPLANT
WATER STERILE IRR 1000ML POUR (IV SOLUTION) ×2 IMPLANT

## 2020-05-27 NOTE — H&P (Signed)
BP (!) 150/64   Pulse 90   Temp 97.6 F (36.4 C) (Axillary)   Resp 16   Ht 5\' 10"  (1.778 m)   Wt 65.7 kg   SpO2 96%   BMI 20.78 kg/m  George Nielsen comes in today for evaluation of tingles he has on the left side of his neck and into his shoulder and left neck pain.  George Nielsen has a history of throat cancer and has been radiated and treated with chemotherapy for that.  He never received surgery on his neck.  He has a fairly pronounced subluxation of C3 on C4, and severe cord compromise at that level.  He also has known rotator cuff problems on the left side, which have yet to be addressed.  He is a [place] patient and is being sent out for further evaluation.  George Nielsen also has undergone a swallow study, which was done in June at [facility].  So, one fundamental problem with the referral is that I am not sure that I am able to do the surgery if he needs it, and he does need an anterior approach, though somewhat because he is kyphotic, and a posterior approach with a kyphotic neck is something that is not at all optimal.  He weighs 145 pounds.  Temperature is 98.5, blood pressure is 130/89, pulse is 108, pain is 6/10.  He said he was recommended to have his throat stretched because he has a block at the C5-6 level on the swallowing study.  He is not myelopathic, complaining only of the pain.  His primary care physician at the [facility] is George Nielsen.  He has had surgery on the elbow, wrist, and back; elbow nerve, a broken wrist, and to fix his lower back.     MEDICATIONS :  He is taking no medications that he told us about today.     ALLERGIES :  He has allergies to poison ivy and bee stings.     REVIEW OF SYSTEMS :  Positive for easy bruising, easy bleeding, shortness of breath, joint swelling, muscle weakness, difficulty with dysphagia, weight loss, and neck pain.     PAST MEDICAL HISTORY :  Includes cancer, emphysema, gastroesophageal reflux disease, hypertension.  He  feels weak in his arms and lower extremities.  He has no bowel or bladder difficulties.  This problem is not new.     SOCIAL HISTORY :  He does not use alcohol.  He has used tobacco in the past.  He does not live alone.  He does not have children.  He is right handed.     PHYSICAL EXAMINATION :  He is alert and oriented by 4.  He has an altered voice, best described as sounding gurgly as if there is fluid in and around his voice.  He says he has significantly dry mouth as a result of the throat cancer and/or his treatment.  He has full strength on exam. 5/5 in the upper extremities.  Grip is excellent. 5/5 in the lower extremities.  2+ reflexes, biceps, triceps, brachioradialis, knees, and ankles.  Proprioception is also intact in the upper extremities.  No clonus.  No Hoffmann sign.  He has a negative Romberg.  He has a normal gait.  Memory, language, attention span, and fund of knowledge are normal.  Full extraocular movements.  Full visual fields.  Pupils equal, round, and reactive to light.     IMAGING :  MRI cervical spine shows fairly severe stenosis at  C3-4, both anterior and posterior component.  When we look at the [place], you do not notice the posterior component as much as you do on the sagittal.  As I explained to George Nielsen, secondary to the kyphosis overall of his neck and to the significant listhesis, an anterior approach is significantly better than a posterior approach.  However, given the fact that he was irradiated for throat cancer into that area, I am not clear that approach is warranted.  Anatomically, it is easily the best surgically operatively, it just might be an exceedingly difficult dissection.  That can only be known once you are there.  And the fact is if an anterior approach is undertaken that can always be aborted, if you find out that the tissue dissection is simply too difficult, and he could be done safely posteriorly, but he does need surgery.  He has a  swallow evaluation, and I just want to get some sort of medical clearance from his primary care physician and/or any of the other physicians taking care of him that I am allowed to operate. He was cleared for surgery. ENT felt that the dissection would not prove too difficult.  I will proceed with and ACDF at C3/4.BP (!) 150/64   Pulse 90   Temp 97.6 F (36.4 C) (Axillary)   Resp 16   Ht 5\' 10"  (1.778 m)   Wt 65.7 kg   SpO2 96%   BMI 20.78 kg/m  George Nielsen is a 65 y.o. male  has decided to undergo an anterior cervical decompression and arthrodesis for cord compression at levels C3/4. Risks and benefits including but not limited to bleeding, infection, paralysis, weakness in one or both extremities, bowel and/or bladder dysfunction, fusion failure, hardware failure, need for further surgery, no relief of pain. He understands and wishes to proceed.

## 2020-05-27 NOTE — Evaluation (Signed)
Physical Therapy Evaluation & Discharge Patient Details Name: George Nielsen MRN: [id number] DOB: [birth year] Today's Date: 05/27/2020   History of Present Illness  65 y/o male s/p ACDF C3-4 on 3/4. PMH includes cancer, emphysema, GERD, HTN  Clinical Impression  PTA, patient lives with fiance and reports independence with mobility. Patient overall modI for mobility with no AD. Provided handout and educated patient on cervical precautions. No skilled acute PT needs required. No PT follow up recommended at this time.     Follow Up Recommendations No PT follow up    Equipment Recommendations  None recommended by PT    Recommendations for Other Services       Precautions / Restrictions Precautions Precautions: Cervical Precaution Booklet Issued: Yes (comment) Required Braces or Orthoses:  (No brace required per orders) Restrictions Weight Bearing Restrictions: No      Mobility  Bed Mobility Overal bed mobility: Modified Independent                  Transfers Overall transfer level: Modified independent Equipment used: None                Ambulation/Gait Ambulation/Gait assistance: Modified independent (Device/Increase time) Gait Distance (Feet): 200 Feet Assistive device: IV Pole Gait Pattern/deviations: WFL(Within Functional Limits) Gait velocity: normal Gait velocity interpretation: >4.37 ft/sec, indicative of normal walking speed    Stairs Stairs:  (Declined stair training. States he feels comfortable and capable in returning home.)          Wheelchair Mobility    Modified Rankin (Stroke Patients Only)       Balance Overall balance assessment: No apparent balance deficits (not formally assessed)                                           Pertinent Vitals/Pain Pain Assessment: Faces Faces Pain Scale: Hurts little more Pain Location: neck - incision site Pain Descriptors / Indicators: Discomfort Pain Intervention(s):  Monitored during session    Home Living Family/patient expects to be discharged to:: Private residence Living Arrangements: Spouse/significant other Available Help at Discharge: Family;Available 24 hours/day Type of Home: House Home Access: Stairs to enter Entrance Stairs-Rails: None Entrance Stairs-Number of Steps: 2 Home Layout: One level Home Equipment: None      Prior Function Level of Independence: Independent               Hand Dominance        Extremity/Trunk Assessment   Upper Extremity Assessment Upper Extremity Assessment: Defer to OT evaluation    Lower Extremity Assessment Lower Extremity Assessment: Overall WFL for tasks assessed       Communication   Communication: No difficulties  Cognition Arousal/Alertness: Awake/alert Behavior During Therapy: WFL for tasks assessed/performed Overall Cognitive Status: Within Functional Limits for tasks assessed                                        General Comments      Exercises     Assessment/Plan    PT Assessment Patent does not need any further PT services  PT Problem List         PT Treatment Interventions      PT Goals (Current goals can be found in the Care Plan section)  Acute Rehab PT Goals  Patient Stated Goal: to go home PT Goal Formulation: With patient    Frequency     Barriers to discharge        Co-evaluation               AM-PAC PT "6 Clicks" Mobility  Outcome Measure Help needed turning from your back to your side while in a flat bed without using bedrails?: None Help needed moving from lying on your back to sitting on the side of a flat bed without using bedrails?: None Help needed moving to and from a bed to a chair (including a wheelchair)?: None Help needed standing up from a chair using your arms (e.g., wheelchair or bedside chair)?: None Help needed to walk in hospital room?: None Help needed climbing 3-5 steps with a railing? : None 6 Click  Score: 24    End of Session   Activity Tolerance: Patient tolerated treatment well Patient left: in bed;with call bell/phone within reach Nurse Communication: Mobility status PT Visit Diagnosis: Muscle weakness (generalized) (M62.81)    Time: 4715-9539 PT Time Calculation (min) (ACUTE ONLY): 11 min   Charges:   PT Evaluation $PT Eval Low Complexity: 1 Low          Michel Eskelson A. Gilford Rile PT, DPT Acute Rehabilitation Services Pager [phone] Office [phone]   Linna Hoff 05/27/2020, 4:44 PM

## 2020-05-27 NOTE — Anesthesia Procedure Notes (Signed)
Procedure Name: Intubation Date/Time: 05/27/2020 8:53 AM Performed by: Jenne Campus, CRNA Pre-anesthesia Checklist: Patient identified, Emergency Drugs available, Suction available and Patient being monitored Patient Re-evaluated:Patient Re-evaluated prior to induction Oxygen Delivery Method: Circle System Utilized Preoxygenation: Pre-oxygenation with 100% oxygen Induction Type: IV induction Ventilation: Mask ventilation without difficulty and Oral airway inserted - appropriate to patient size Laryngoscope Size: Glidescope and 4 Grade View: Grade I Tube type: Oral Tube size: 7.5 mm Number of attempts: 1 Airway Equipment and Method: Stylet,  Oral airway and Video-laryngoscopy Placement Confirmation: ETT inserted through vocal cords under direct vision,  positive ETCO2 and breath sounds checked- equal and bilateral Secured at: 22 cm Tube secured with: Tape Dental Injury: Teeth and Oropharynx as per pre-operative assessment  Difficulty Due To: Difficulty was anticipated and Difficult Airway- due to reduced neck mobility

## 2020-05-27 NOTE — Anesthesia Postprocedure Evaluation (Signed)
Anesthesia Post Note  Patient: George Nielsen  Procedure(s) Performed: Cervical Three-Four Anterior cervical decompression/discectomy/fusion (N/A Spine Cervical)     Patient location during evaluation: PACU Anesthesia Type: General Level of consciousness: awake and alert and oriented Pain management: pain level controlled Vital Signs Assessment: post-procedure vital signs reviewed and stable Respiratory status: spontaneous breathing, nonlabored ventilation and respiratory function stable Cardiovascular status: blood pressure returned to baseline Postop Assessment: no apparent nausea or vomiting Anesthetic complications: no   No complications documented.  Last Vitals:  Vitals:   05/27/20 1215 05/27/20 1230  BP: (!) 151/74 (!) 130/48  Pulse: 88 84  Resp: 12 11  Temp:    SpO2: 97% 98%    Last Pain:  Vitals:   05/27/20 1200  TempSrc:   PainSc: [place]

## 2020-05-27 NOTE — Transfer of Care (Signed)
Immediate Anesthesia Transfer of Care Note  Patient: George Nielsen  Procedure(s) Performed: Cervical Three-Four Anterior cervical decompression/discectomy/fusion (N/A Spine Cervical)  Patient Location: PACU  Anesthesia Type:General  Level of Consciousness: awake, oriented and patient cooperative  Airway & Oxygen Therapy: Patient Spontanous Breathing and Patient connected to nasal cannula oxygen  Post-op Assessment: Report given to RN and Post -op Vital signs reviewed and stable  Post vital signs: Reviewed  Last Vitals:  Vitals Value Taken Time  BP 144/74 05/27/20 1144  Temp    Pulse 88 05/27/20 1146  Resp 17 05/27/20 1146  SpO2 100 % 05/27/20 1146  Vitals shown include unvalidated device data.  Last Pain:  Vitals:   05/27/20 0634  TempSrc: Axillary  PainSc:       Patients Stated Pain Goal: 4 (50/15/86 8257)  Complications: No complications documented.

## 2020-05-27 NOTE — Op Note (Signed)
05/27/2020  1:26 PM  PATIENT:  George Nielsen  65 y.o. male  PRE-OPERATIVE DIAGNOSIS:  Spondylolisthesis of cervical region C4/5 Cord compression Cervical myelopathy  POST-OPERATIVE DIAGNOSIS:  Spondylolisthesis of cervical region  PROCEDURE:  Anterior Cervical decompression C3/4 Arthrodesis C3/4 with 45mm structural allograft Anterior instrumentation(nuvasive) C3/4  SURGEON:   Surgeon(s): Ashok Pall, MD Earnie Larsson, MD   ASSISTANTS:Pool, Mallie Mussel  ANESTHESIA:   general  EBL:  Total I/O In: 1100 [I.V.:1000; IV Piggyback:100] Out: 380 [Urine:350; Blood:30]  BLOOD ADMINISTERED:none  CELL SAVER GIVEN:none  COUNT:per nursing  DRAINS: none   SPECIMEN:  No Specimen  DICTATION: Mr.Holsworth was taken to the operating room, intubated, and placed under general anesthesia without difficulty. He was positioned supine with his head in slight extension on a horseshoe headrest. The neck was prepped and draped in a sterile manner. I infiltrated 4 cc's 1/2%lidocaine/1:200,000 strength epinephrine into the planned incision starting from the midline to the medial border of the left sternocleidomastoid muscle. I opened the incision with a 10 blade and dissected sharply through soft tissue to the platysma. I dissected in the plane superior to the platysma both rostrally and caudally. I then opened the platysma in a horizontal fashion with Metzenbaum scissors, and dissected in the inferior plane rostrally and caudally. With both blunt and sharp technique I created an avascular corridor to the cervical spine. I placed a spinal needle(s) in the disc space at 3/4 . I then reflected the longus colli from C3 to C4 and placed self retaining retractors. I opened the disc space(s) at 3/4 with a 15 blade. I removed disc with curettes, Kerrison punches, and the drill. Using the drill I removed osteophytes and prepared for the decompression.  I decompressed the spinal canal and the C4 root(s) with the  drill, Kerrison punches, and the curettes. I used the microscope to aid in microdissection. I removed the posterior longitudinal ligament to fully expose and decompress the thecal sac. I exposed the roots laterally taking down the 3/4 uncovertebral joints. With the decompression complete we moved on to the arthrodesis. I used the drill to level the surfaces of C3, 4. I removed soft tissue to prepare the disc space and the bony surfaces. I measured the space and placed a 57mm structural allograft into the disc space.  I then placed the anterior instrumentation. I placed 2 screws in each vertebral body through the plate. I locked the screws into place. Intraoperative xray showed the graft, plate, and screws to be in good position. I irrigated the wound, achieved hemostasis, and closed the wound in layers. I approximated the platysma, and the subcuticular plane with vicryl sutures. I used Dermabond for a sterile dressing.   PLAN OF CARE: Admit for overnight observation  PATIENT DISPOSITION:  PACU - hemodynamically stable.   Delay start of Pharmacological VTE agent (>24hrs) due to surgical blood loss or risk of bleeding:  yes

## 2020-05-28 DIAGNOSIS — M4312 Spondylolisthesis, cervical region: Secondary | ICD-10-CM | POA: Diagnosis not present

## 2020-05-28 MED ORDER — OXYCODONE HCL 10 MG PO TABS: 10.0000 mg | ORAL_TABLET | Freq: Four times a day (QID) | ORAL | 0 refills | Status: DC | PRN

## 2020-05-28 MED ORDER — CYCLOBENZAPRINE HCL 10 MG PO TABS: 10.0000 mg | ORAL_TABLET | Freq: Three times a day (TID) | ORAL | 0 refills | Status: DC | PRN

## 2020-05-28 MED ORDER — SODIUM CHLORIDE 1 G PO TABS
0.5000 g | ORAL_TABLET | Freq: Every day | ORAL | Status: DC
  Filled 2020-05-28: qty 0.5

## 2020-05-28 NOTE — Progress Notes (Signed)
Patient is discharged from room 3C04 at this time. Alert and in stable condition. IV site d/c'd and instructions read to patient with understanding verbalized and all questions answered. Left unit via wheelchair with all belongings at side. 

## 2020-05-28 NOTE — Progress Notes (Signed)
Orthopedic Tech Progress Note Patient Details:  BOWMAN HIGBIE [birth year] [id number] RN called requesting a SOFT COLLAR Ortho Devices Type of Ortho Device: Soft collar Ortho Device/Splint Location: NECK Ortho Device/Splint Interventions: Application   Post Interventions Patient Tolerated: Well Instructions Provided: Care of device   Janit Pagan 05/28/2020, 7:32 AM

## 2020-05-28 NOTE — Discharge Summary (Signed)
Physician Discharge Summary  Patient ID: George Nielsen MRN: [id number] DOB/AGE: 1956/03/22 65 y.o.  Admit date: 05/27/2020 Discharge date: 05/28/2020  Admission Diagnoses:  Cervical HNP with myelopathy  Discharge Diagnoses:  Same Active Problems:   HNP (herniated nucleus pulposus) with myelopathy, cervical   Discharged Condition: Stable  Hospital Course:  George Nielsen is a 65 y.o. male admitted after elective ACDF. He was reporting imrovement neck/shoulder pain, ambulating well, tolerating diet with minimal difficulty swallowing and voiding normally. He was therefore discharged in stable condition.   Treatments: Surgery - ACDF C3-4  Discharge Exam: Blood pressure (!) 149/78, pulse 87, temperature 97.9 F (36.6 C), resp. rate 18, height 5\' 10"  (1.778 m), weight 65.7 kg, SpO2 95 %. Awake, alert, oriented Speech fluent, appropriate CN grossly intact 5/5 BUE/BLE Wound c/d/i  Disposition: Discharge disposition: 01-Home or Self Care       Discharge Instructions    Call MD for:  redness, tenderness, or signs of infection (pain, swelling, redness, odor or green/yellow discharge around incision site)   Complete by: As directed    Call MD for:  temperature >100.4   Complete by: As directed    Diet - low sodium heart healthy   Complete by: As directed    Discharge instructions   Complete by: As directed    Walk at home as much as possible, at least 4 times / day   Increase activity slowly   Complete by: As directed    Lifting restrictions   Complete by: As directed    No lifting > 10 lbs   May shower / Bathe   Complete by: As directed    48 hours after surgery   May walk up steps   Complete by: As directed    Other Restrictions   Complete by: As directed    No bending/twisting at waist   Remove dressing in 48 hours   Complete by: As directed      Allergies as of 05/28/2020      Reactions   Bee Venom Swelling   Vicodin [hydrocodone-acetaminophen]  Nausea And Vomiting      Medication List    TAKE these medications   acetaminophen 500 MG tablet Commonly known as: TYLENOL Take 1,000 mg by mouth every 8 (eight) hours as needed for moderate pain.   albuterol 108 (90 Base) MCG/ACT inhaler Commonly known as: VENTOLIN HFA Inhale 2 puffs into the lungs every 6 (six) hours as needed for wheezing or shortness of breath.   amLODipine 10 MG tablet Commonly known as: NORVASC Take 10 mg by mouth daily.   buPROPion 150 MG 12 hr tablet Commonly known as: WELLBUTRIN SR Take 150 mg by mouth 2 (two) times daily.   chlorhexidine 0.12 % solution Commonly known as: PERIDEX Use as directed 15 mLs in the mouth or throat 2 (two) times daily.   ferrous sulfate 325 (65 FE) MG tablet Take 325 mg by mouth every other day.   gabapentin 300 MG capsule Commonly known as: NEURONTIN Take 300-600 mg by mouth See admin instructions. Take 1 capsule (300 mg) by mouth in the morning, take 1 capsule (300 mg) by mouth at noon & take 3 capsules (900 mg) by mouth at night.   hydrOXYzine 10 MG tablet Commonly known as: ATARAX/VISTARIL Take 10 mg by mouth 3 (three) times daily as needed for anxiety.   ipratropium 0.02 % nebulizer solution Commonly known as: ATROVENT Take 0.5 mg by nebulization 4 (four) times daily as needed  for wheezing or shortness of breath.   ipratropium 0.06 % nasal spray Commonly known as: ATROVENT Place 1 spray into both nostrils 4 (four) times daily.   levothyroxine 112 MCG tablet Commonly known as: SYNTHROID Take 112 mcg by mouth daily before breakfast.   lisinopril 20 MG tablet Commonly known as: ZESTRIL Take 20 mg by mouth daily.   loratadine 10 MG tablet Commonly known as: CLARITIN Take 10 mg by mouth daily.   Magnesium Oxide 420 MG Tabs Take 210 mg by mouth daily.   meloxicam 7.5 MG tablet Commonly known as: MOBIC Take 7.5 mg by mouth 2 (two) times daily as needed (back spasms).   Oxycodone HCl 10 MG Tabs Take 1  tablet (10 mg total) by mouth every 6 (six) hours as needed for severe pain ((score 7 to 10)).   pantoprazole 40 MG tablet Commonly known as: PROTONIX Take 40 mg by mouth in the morning and at bedtime.   sodium chloride 0.65 % Soln nasal spray Commonly known as: OCEAN Place 1 spray into both nostrils in the morning, at noon, and at bedtime.   SODIUM CHLORIDE PO Take 125 g by mouth daily.   sodium fluoride 1.1 % Gel dental gel Commonly known as: FLUORISHIELD Place 1 application onto teeth in the morning and at bedtime.   tamsulosin 0.4 MG Caps capsule Commonly known as: FLOMAX Take 0.8 mg by mouth at bedtime.         Follow-up Information    Ashok Pall, MD. Schedule an appointment as soon as possible for a visit in 3 week(s).   Specialty: Neurosurgery Contact information: [address] [place] [address] [phone]                Signed: Jairo Ben 05/28/2020, 9:19 AM

## 2020-05-28 NOTE — Discharge Instructions (Signed)

## 2020-05-28 NOTE — Evaluation (Signed)
Occupational Therapy Evaluation/Discharge  Patient Details Name: George Nielsen MRN: [id number] DOB: November 08, 1955 Today's Date: 05/28/2020    History of Present Illness 65 y/o male s/p ACDF C3-4 on 3/4. PMH includes cancer, emphysema, GERD, HTN   Clinical Impression   PTA, pt lives with fiance and typically independent with ADLs, IADLs and mobility without AD. Collaborated with pt on cervical precautions and strategies for ADLs/IADLs with pt verbalizing understanding. Pt overall Independent with ADLs and mobility in room without AD though limited at times due to increased pain. No brace needed per orders, but pt interested in obtaining soft collar for comfort and a reminder to avoid excessive neck ROM - nursing staff aware. Encouraged pt to have assistance from fiance with heavier IADLs (laundry, grocery shopping, etc) to maintain lifting restrictions. No further skilled OT services needed at acute level or on discharge. OT to sign off.     Follow Up Recommendations  No OT follow up    Equipment Recommendations  None recommended by OT    Recommendations for Other Services       Precautions / Restrictions Precautions Precautions: Cervical Precaution Booklet Issued: Yes (comment) Precaution Comments: no brace needed per orders Restrictions Weight Bearing Restrictions: No      Mobility Bed Mobility Overal bed mobility: Modified Independent                  Transfers Overall transfer level: Independent Equipment used: None                  Balance Overall balance assessment: No apparent balance deficits (not formally assessed)                                         ADL either performed or assessed with clinical judgement   ADL Overall ADL's : Independent                                       General ADL Comments: able to ambulate to bathroom without AD, without assist. Reports having brushed teeth, dressed, etc without  assist. Educated on ADL/IADL completion while following cervical precautions to optimize healing     Vision Patient Visual Report: No change from baseline Vision Assessment?: No apparent visual deficits     Perception     Praxis      Pertinent Vitals/Pain Pain Assessment: Faces Faces Pain Scale: Hurts little more Pain Location: neck - incision site Pain Descriptors / Indicators: Discomfort;Sore Pain Intervention(s): Monitored during session;Other (comment) (pt plans to ask for pain meds after breakfast)     Hand Dominance Right   Extremity/Trunk Assessment Upper Extremity Assessment Upper Extremity Assessment: Overall WFL for tasks assessed   Lower Extremity Assessment Lower Extremity Assessment: Defer to PT evaluation   Cervical / Trunk Assessment Cervical / Trunk Assessment: Other exceptions (neck forward flexion)   Communication Communication Communication: No difficulties   Cognition Arousal/Alertness: Awake/alert Behavior During Therapy: WFL for tasks assessed/performed Overall Cognitive Status: Within Functional Limits for tasks assessed                                     General Comments  Pt reports interest in soft collar for comfort and to avoid excessive ROM.  He reports he wants this to "heal right this time" - RN aware    Exercises     Shoulder Instructions      Home Living Family/patient expects to be discharged to:: Private residence Living Arrangements: Spouse/significant other Available Help at Discharge: Family;Available 24 hours/day Type of Home: House Home Access: Stairs to enter [employer] of Steps: 2 Entrance Stairs-Rails: None Home Layout: One level     Bathroom Shower/Tub: Teacher, early years/pre: Standard     Home Equipment: Shower seat          Prior Functioning/Environment Level of Independence: Independent                 OT Problem List: Decreased range of motion;Pain       OT Treatment/Interventions:      OT Goals(Current goals can be found in the care plan section) Acute Rehab OT Goals Patient Stated Goal: to go home OT Goal Formulation: All assessment and education complete, DC therapy  OT Frequency:     Barriers to D/C:            Co-evaluation              AM-PAC OT "6 Clicks" Daily Activity     Outcome Measure Help from another person eating meals?: None Help from another person taking care of personal grooming?: None Help from another person toileting, which includes using toliet, bedpan, or urinal?: None Help from another person bathing (including washing, rinsing, drying)?: None Help from another person to put on and taking off regular upper body clothing?: None Help from another person to put on and taking off regular lower body clothing?: None 6 Click Score: 24   End of Session Nurse Communication: Mobility status;Other (comment) (request for soft collar)  Activity Tolerance: Patient tolerated treatment well Patient left: in bed;with call bell/phone within reach  OT Visit Diagnosis: Pain Pain - part of body:  (neck)                Time: 4076-8088 OT Time Calculation (min): 15 min Charges:  OT General Charges $OT Visit: 1 Visit OT Evaluation $OT Eval Low Complexity: 1 Low  Malachy Chamber, OTR/L Acute Rehab Services Office: [phone]  Layla Maw 05/28/2020, 7:21 AM

## 2020-05-30 ENCOUNTER — Encounter (HOSPITAL_COMMUNITY): Payer: Self-pay | Admitting: Neurosurgery

## 2020-05-30 MED FILL — Thrombin (Recombinant) For Soln 5000 Unit: CUTANEOUS | Qty: 5000 | Status: AC

## 2020-08-04 ENCOUNTER — Other Ambulatory Visit (HOSPITAL_BASED_OUTPATIENT_CLINIC_OR_DEPARTMENT_OTHER): Payer: Self-pay

## 2020-08-04 ENCOUNTER — Ambulatory Visit: Payer: No Typology Code available for payment source | Attending: Internal Medicine

## 2020-08-04 ENCOUNTER — Other Ambulatory Visit: Payer: Self-pay

## 2020-08-04 DIAGNOSIS — Z23 Encounter for immunization: Secondary | ICD-10-CM

## 2020-08-04 MED ORDER — PFIZER-BIONT COVID-19 VAC-TRIS 30 MCG/0.3ML IM SUSP
INTRAMUSCULAR | 0 refills | Status: DC
  Filled 2020-08-04: qty 0.3, 1d supply, fill #0

## 2020-08-04 NOTE — Progress Notes (Signed)
   Covid-19 Vaccination Clinic  Name:  George Nielsen    MRN: [id number] DOB: 10-05-1955  08/04/2020  George Nielsen was observed post Covid-19 immunization for 15 minutes without incident. He was provided with Vaccine Information Sheet and instruction to access the V-Safe system.   George Nielsen was instructed to call 911 with any severe reactions post vaccine: Marland Kitchen Difficulty breathing  . Swelling of face and throat  . A fast heartbeat  . A bad rash all over body  . Dizziness and weakness   Immunizations Administered    Name Date Dose VIS Date Route   PFIZER Comrnaty(Gray TOP) Covid-19 Vaccine 08/04/2020  2:35 PM 0.3 mL 03/03/2020 Intramuscular   Manufacturer: [employer]   Lot: [id number]   NDC: [phone]

## 2020-09-12 ENCOUNTER — Ambulatory Visit: Payer: No Typology Code available for payment source

## 2020-09-13 ENCOUNTER — Ambulatory Visit: Payer: No Typology Code available for payment source | Attending: Neurosurgery | Admitting: Physical Therapy

## 2020-09-13 ENCOUNTER — Encounter: Payer: Self-pay | Admitting: Physical Therapy

## 2020-09-13 ENCOUNTER — Other Ambulatory Visit: Payer: Self-pay

## 2020-09-13 DIAGNOSIS — M6281 Muscle weakness (generalized): Secondary | ICD-10-CM | POA: Insufficient documentation

## 2020-09-13 DIAGNOSIS — R293 Abnormal posture: Secondary | ICD-10-CM | POA: Diagnosis present

## 2020-09-13 DIAGNOSIS — M542 Cervicalgia: Secondary | ICD-10-CM | POA: Insufficient documentation

## 2020-09-13 NOTE — Patient Instructions (Signed)
Access Code: [email or web address] URL: [email or web address] Date: 09/13/2020 Prepared by: Ruben Im  Exercises Supine Cervical Retraction with Towel - 5 x daily - 7 x weekly - 1 sets - 5-8 reps - 5 hold Supine Scapular Retraction - 5 x daily - 7 x weekly - 1 sets - 5-8 reps - 5 hold Supine Shoulder Press - 1 x daily - 7 x weekly - 1 sets - 5 reps - 5 hold

## 2020-09-13 NOTE — Therapy (Signed)
[facility] [address], [place], [place], [address] Phone: [phone]   Fax:  [phone]  Physical Therapy Evaluation  Patient Details  Name: LEMARCUS BAGGERLY MRN: [id number] Date of Birth: [birth year] Referring Provider (PT): Dr. Christella Noa   Encounter Date: 09/13/2020   PT End of Session - 09/13/20 1735     Visit Number 1    Number of Visits 15    Date for PT Re-Evaluation 12/06/20    Authorization Type [facility]    PT Start Time 0800    PT Stop Time 0845    PT Time Calculation (min) 45 min    Activity Tolerance Patient tolerated treatment well             Past Medical History:  Diagnosis Date   Allergy    Anemia    Anxiety    Arthritis    Cancer ([place])    tongue and neck lymp nodes   COPD (chronic obstructive pulmonary disease) ([place])    Dyspnea    climbing stairs, walking distance   Emphysema of lung ([facility])    GERD (gastroesophageal reflux disease)    Hepatitis C    treated   History of radiation therapy 11/10/14- 12/29/14   BOT and bilateral neck/ 70 Gy in 35 fractions to gross disease, 63 Gy in 35 fractions to high risk nodal echelons, and 56 Gy in 35 fraction to intermediate risk nodal echelons.    Hypertension    Hypothyroidism    PNA (pneumonia)    4 times   Sleep apnea    wears CPAP sometimes per pt.   05/25/19 - not able to use at this time due to neck pain.   Thyroid disease    take Synthroid d/t scar tissue from radiation around thyroid   Ulcer     Past Surgical History:  Procedure Laterality Date   ANTERIOR CERVICAL DECOMP/DISCECTOMY FUSION N/A 05/27/2020   Procedure: Cervical Three-Four Anterior cervical decompression/discectomy/fusion;  Surgeon: Ashok Pall, MD;  Location: [place];  Service: Neurosurgery;  Laterality: N/A;  anterior   BACK SURGERY     COLONOSCOPY WITH PROPOFOL N/A 01/06/2020   Procedure: COLONOSCOPY WITH PROPOFOL;  Surgeon: Lavena Bullion, DO;   Location[facility];  Service: Gastroenterology;  Laterality: N/A;   ELBOW SURGERY Left    HEMOSTASIS CLIP PLACEMENT  01/06/2020   Procedure: HEMOSTASIS CLIP PLACEMENT;  Surgeon: Lavena Bullion, DO;  Location[facility];  Service: Gastroenterology;;   POLYPECTOMY  01/06/2020   Procedure: POLYPECTOMY;  Surgeon: Lavena Bullion, DO;  [facility];  Service: Gastroenterology;;   SHOULDER SURGERY     TONSILLECTOMY     tracheotomy     WRIST SURGERY     right    There were no vitals filed for this visit.    Subjective Assessment - 09/13/20 0802     Subjective A few years ago left upper shoulder numbness.  Fused C3-4 2 months ago (anterior approach).  Since then I can't hold my head up.  It feels like my head weighs 50#.    Pain right base of the head.  Numbness persists.  Wore collar 1-2 weeks but then was told not to wear.    Pertinent History lumbar fusion 2004   March 4th 2022   Radiation damage to throat--finished CA treatment (had chemo too) 5 years ago; left rotator cuff tear (plans to have surgery)    Limitations Sitting    How  long can you sit comfortably? a good while    How long can you walk comfortably? My head hangs done    Diagnostic tests MRI    Patient Stated Goals more movement; less pain; hold head up    Currently in Pain? Yes    Pain Score 4     Pain Location Neck    Pain Orientation Posterior    Pain Type Surgical pain    Pain Frequency Constant    Aggravating Factors  worse as the day goes; driving/turning  head; looking up    Pain Relieving Factors sleep                [facility] PT Assessment - 09/13/20 0001       Assessment   Medical Diagnosis cervical fusion    Referring Provider (PT) Dr. Christella Noa    Onset Date/Surgical Date 05/27/20    Hand Dominance Right    Next MD Visit this week    Prior Therapy shoulder left; went 1x for neck but then stopped bc of covid      Precautions   Precautions None    Precaution Comments "I can't  recall anything surgically"      Restrictions   Weight Bearing Restrictions No      Balance Screen   Has the patient fallen in the past 6 months No    Has the patient had a decrease in activity level because of a fear of falling?  No    Is the patient reluctant to leave their home because of a fear of falling?  No      Home Environment   Living Environment Private residence    Type of [place] to enter    [place] One level      Prior Function   Level of Independence Independent with basic ADLs    Vocation Retired    Chief Strategy Officer      Observation/Other Assessments   Focus on Therapeutic Outcomes (FOTO)  57%      Posture/Postural Control   Posture Comments walks, sits with head flexed 55 degrees; when asked to hold head erect he is able to come to neutral but only hold 5 sec      AROM   Overall AROM Comments pain with left shoulder ROM (hx of rotator cuff tear); dec thoracic extension mobility    Cervical Flexion full    Cervical Extension 0    Cervical - Right Side Bend 35    Cervical - Left Side Bend 15    Cervical - Right Rotation 30    Cervical - Left Rotation 25      Strength   Overall Strength Comments Left UE deficient shoulder; dec scapular strength 4-/5    Cervical Extension 2-/5    Cervical - Right Side Bend 3-/5    Cervical - Left Side Bend 3-/5    Cervical - Right Rotation 3-/5      Palpation   Palpation comment dec posterior and anterior soft tissue mobility: scalenes, SCM, paraspinals, suboccipitals                        Objective measurements completed on examination: See above findings.               PT Education - 09/13/20 1734     Education Details supine neck press; supine scap retract; supine shoulder extension press  Person(s) Educated Patient    Methods Explanation;Demonstration;Handout    Comprehension Returned demonstration;Verbalized understanding               PT Short Term Goals - 09/13/20 1753       PT SHORT TERM GOAL #1   Title The patient will demonstrate basic HEP to promote ROM and mobility of neck    Time 6    Period Weeks    Status New    Target Date 10/25/20      PT SHORT TERM GOAL #2   Title The patient will be able to hold head in neutral for 1 minute for light household and yardwork    Time 6    Period Weeks    Status New      PT SHORT TERM GOAL #3   Title The patient will have improved cervical rotation to 40 degrees needed for driving    Time 6    Period Weeks    Status New      PT SHORT TERM GOAL #4   Title The patient will report a 30% improvement in neck pain with usual ADLs    Time 6    Period Weeks    Status New               PT Long Term Goals - 09/13/20 1756       PT LONG TERM GOAL #1   Title The patient will be independent with safe self progression of HEP    Time 12    Period Weeks    Status New    Target Date 12/06/20      PT LONG TERM GOAL #2   Title The patient will be able to hold head in neutral for 2 minutes for household chores and yardwork (trimming limbs)    Time 12    Period Weeks    Status New      PT LONG TERM GOAL #3   Title The patient will have improved cervical extension to 15 degrees and bil sidebending to 40 degrees needed for yardwork    Time 12    Period Weeks    Status New      PT LONG TERM GOAL #4   Title The patient will report a 60% improvement in pain and performance of ADLs    Time 12    Period Weeks    Status New      PT LONG TERM GOAL #5   Title FOTO functional outcome score improved to 61%    Time 12    Period Weeks    Status New                    Plan - 09/13/20 1737     Clinical Impression Statement The patient reports a long history of neck and left upper shoulder region numbness.  He underwent anterior cervical fusion on March 4th.  As a result, he reports he is unable to hold his head up and states "it feel like  my head weighs 50#".  He ambulates and sits with his chin resting on his chest.  With cues he is able to hold his head in neutral but for only 5 sec.  He also complains of posterior neck tightness and pain into the back of his head.  He states he feels worse as the day goes on.  He tried trimming some limbs the other day and that didn't go well.  He also  has difficulty turning to drive.  Decreased cervical ROM in all planes:  extension 0 degrees, right sidebend 35, left 15, right rotation 30, left rotation 25 degrees.  Decreased anterior and posterior cervical soft tissue mobility.  Anterior tissue also may be affected by radiation treatment from cancer.  Left shoulder ROM painful with pt reporting a rotator cuff tear.  In supine, with gravity assist, he is able to activate cervical extensor muscles and hold for longer duration.  Overall cervical muscle strength is 2/5.  He would benefit from PT to address these deficits.    Personal Factors and Comorbidities Comorbidity 1;Comorbidity 2;Comorbidity 3+    Comorbidities history of CA base of tongue with radiation/chemo; COPD; HTN; lumbar fusion 2004;  left rotator cuff tear    Examination-Activity Limitations Reach Overhead;Carry;Lift;Dressing;Other    Examination-Participation Restrictions Cleaning;Driving;Yard Work;Shop;Community Activity    Stability/Clinical Decision Making Evolving/Moderate complexity    Clinical Decision Making Moderate    Rehab Potential Good    PT Frequency 2x / week    PT Duration 12 weeks    PT Treatment/Interventions ADLs/Self Care Home Management;Aquatic Therapy;Cryotherapy;Electrical Stimulation;Ultrasound;Moist Heat;Therapeutic activities;Therapeutic exercise;Neuromuscular re-education;Manual techniques;Patient/family education;Dry needling;Taping    PT Next Visit Plan neck extensor strengthening initially in supine (gravity assisted);  neck isometrics; soft tissue work for anterior and posterior neck muscles;  after 3 month  post -surgical mark he may be a candidate for DN    PT Home Exercise Plan [email or web address]    Consulted and Agree with Plan of Care Patient             Patient will benefit from skilled therapeutic intervention in order to improve the following deficits and impairments:  Pain, Decreased range of motion, Increased fascial restricitons, Decreased strength, Postural dysfunction, Impaired perceived functional ability, Decreased activity tolerance, Increased muscle spasms, Impaired UE functional use  Visit Diagnosis: Cervicalgia - Plan: PT plan of care cert/re-cert  Muscle weakness (generalized) - Plan: PT plan of care cert/re-cert  Abnormal posture - Plan: PT plan of care cert/re-cert     Problem List Patient Active Problem List   Diagnosis Date Noted   HNP (herniated nucleus pulposus) with myelopathy, cervical 05/27/2020   Positive FIT (fecal immunochemical test)    Polyp of ascending colon    Adenomatous polyp of sigmoid colon    Diverticulosis of colon without hemorrhage    Grade II internal hemorrhoids    Malignant neoplasm of base of tongue ([place]) 10/27/2014   Chronic kidney disease    COPD (chronic obstructive pulmonary disease) ([place]) 07/10/2012   Chest pain 07/10/2012   HTN (hypertension) 07/10/2012   Hyponatremia 07/10/2012   TOBACCO USE 02/07/2010   Ruben Im, PT 09/13/20 6:02 PM Phone: [phone] Fax: [phone]  Alvera Singh 09/13/2020, 6:02 PM  [facility] [address] [place], [place], [address] Phone: [phone]   Fax:  [phone]  Name: RIYAN HAILE MRN: [id number] Date of Birth: [birth year]

## 2020-09-16 ENCOUNTER — Encounter: Payer: No Typology Code available for payment source | Admitting: Physical Therapy

## 2020-09-16 ENCOUNTER — Encounter: Payer: Self-pay | Admitting: Physical Therapy

## 2020-09-16 ENCOUNTER — Ambulatory Visit: Payer: No Typology Code available for payment source | Admitting: Physical Therapy

## 2020-09-16 ENCOUNTER — Other Ambulatory Visit: Payer: Self-pay

## 2020-09-16 DIAGNOSIS — M6281 Muscle weakness (generalized): Secondary | ICD-10-CM

## 2020-09-16 DIAGNOSIS — M542 Cervicalgia: Secondary | ICD-10-CM

## 2020-09-16 DIAGNOSIS — R293 Abnormal posture: Secondary | ICD-10-CM

## 2020-09-16 NOTE — Patient Instructions (Signed)
Access Code: [email or web address] URL: [email or web address] Date: 09/16/2020 Prepared by: Venetia Night Chalise Pe  Exercises Supine Cervical Retraction with Towel - 5 x daily - 7 x weekly - 1 sets - 5-8 reps - 5 hold Supine Scapular Retraction - 5 x daily - 7 x weekly - 1 sets - 5-8 reps - 5 hold Supine Shoulder Press - 5 x daily - 7 x weekly - 1 sets - 5 reps - 5 hold Supine Cervical Rotation AROM on Pillow - 5 x daily - 7 x weekly - 2 sets - 10 reps Supine Isometric Neck Sidebend (Lateral Flexion as an alternative name) - 5 x daily - 7 x weekly - 2 sets - 5 reps - 5 hold Supine Isometric Neck Rotation - 5 x daily - 7 x weekly - 2 sets - 5 reps - 5 hold

## 2020-09-16 NOTE — Therapy (Signed)
[facility] [address], [place], [place], [address] Phone: [phone]   Fax:  [phone]  Physical Therapy Treatment  Patient Details  Name: George Nielsen MRN: [id number] Date of Birth: 12/10/1955 Referring Provider (PT): Dr. Christella Noa   Encounter Date: 09/16/2020   PT End of Session - 09/16/20 0847     Visit Number 2    Number of Visits 15    Date for PT Re-Evaluation 12/06/20    Authorization Type 15 [facility]    Authorization - Visit Number 2    Authorization - Number of Visits 15    PT Start Time 0847    PT Stop Time 0928    PT Time Calculation (min) 41 min    Activity Tolerance Patient tolerated treatment well    Behavior During Therapy [facility] for tasks assessed/performed             Past Medical History:  Diagnosis Date   Allergy    Anemia    Anxiety    Arthritis    Cancer ([place])    tongue and neck lymp nodes   COPD (chronic obstructive pulmonary disease) ([place])    Dyspnea    climbing stairs, walking distance   Emphysema of lung ([place])    GERD (gastroesophageal reflux disease)    Hepatitis C    treated   History of radiation therapy 11/10/14- 12/29/14   BOT and bilateral neck/ 70 Gy in 35 fractions to gross disease, 63 Gy in 35 fractions to high risk nodal echelons, and 56 Gy in 35 fraction to intermediate risk nodal echelons.    Hypertension    Hypothyroidism    PNA (pneumonia)    4 times   Sleep apnea    wears CPAP sometimes per pt.   05/25/19 - not able to use at this time due to neck pain.   Thyroid disease    take Synthroid d/t scar tissue from radiation around thyroid   Ulcer     Past Surgical History:  Procedure Laterality Date   ANTERIOR CERVICAL DECOMP/DISCECTOMY FUSION N/A 05/27/2020   Procedure: Cervical Three-Four Anterior cervical decompression/discectomy/fusion;  Surgeon: Ashok Pall, MD;  Location: [place];  Service: Neurosurgery;  Laterality: N/A;  anterior    BACK SURGERY     COLONOSCOPY WITH PROPOFOL N/A 01/06/2020   Procedure: COLONOSCOPY WITH PROPOFOL;  Surgeon: Lavena Bullion, DO;  [facility];  Service: Gastroenterology;  Laterality: N/A;   ELBOW SURGERY Left    HEMOSTASIS CLIP PLACEMENT  01/06/2020   Procedure: HEMOSTASIS CLIP PLACEMENT;  Surgeon: Lavena Bullion, DO;  [facility];  Service: Gastroenterology;;   POLYPECTOMY  01/06/2020   Procedure: POLYPECTOMY;  Surgeon: Lavena Bullion, DO;  [facility];  Service: Gastroenterology;;   SHOULDER SURGERY     TONSILLECTOMY     tracheotomy     WRIST SURGERY     right    There were no vitals filed for this visit.   Subjective Assessment - 09/16/20 0847     Subjective Discomfort vs pain rated 4/10.  I am doing HEP and it is fine.  They make my neck tired.    Pertinent History lumbar fusion 2004   March 4th 2022   Radiation damage to throat--finished CA treatment (had chemo too) 5 years ago; left rotator cuff tear (plans to have surgery)    Limitations Sitting    How long can you sit comfortably? a good while  How long can you walk comfortably? My head hangs down    Diagnostic tests MRI    Patient Stated Goals more movement; less pain; hold head up    Currently in Pain? Yes    Pain Score 4     Pain Location Neck    Pain Orientation Posterior;Right    Pain Descriptors / Indicators Discomfort    Pain Type Surgical pain    Pain Onset More than a month ago    Pain Frequency Constant    Aggravating Factors  as day goes on, driving/turning head, looking up    Pain Relieving Factors sleep                               [facility] Adult PT Treatment/Exercise - 09/16/20 0001       Exercises   Exercises Shoulder;Neck      Neck Exercises: Supine   Cervical Isometrics Right lateral flexion;Left lateral flexion;Right rotation;Left rotation;5 secs;5 reps    Neck Retraction 10 reps;5 secs    Capital Flexion 10 reps    Capital  Flexion Limitations 5 sec    Cervical Rotation Both;10 reps    Cervical Rotation Limitations in retraction      Shoulder Exercises: Supine   Flexion AAROM;Both;10 reps    Flexion Limitations with cane, within tolerable range for Lt shoulder (Hx of RC tear)    Other Supine Exercises scapular retraction 10x5 sec holds    Other Supine Exercises supine shoulder press into table 10x5 sec      Manual Therapy   Manual Therapy Soft tissue mobilization;Passive ROM;Joint mobilization    Manual therapy comments supine with 2 pillows and towel prop between both pillows    Joint Mobilization Gr I/II sideglides upper cervical Rt to Lt    Soft tissue mobilization bil scalenes and SCM, cervical occipitals    Passive ROM gentle upper cervical nodding                      PT Short Term Goals - 09/13/20 1753       PT SHORT TERM GOAL #1   Title The patient will demonstrate basic HEP to promote ROM and mobility of neck    Time 6    Period Weeks    Status New    Target Date 10/25/20      PT SHORT TERM GOAL #2   Title The patient will be able to hold head in neutral for 1 minute for light household and yardwork    Time 6    Period Weeks    Status New      PT SHORT TERM GOAL #3   Title The patient will have improved cervical rotation to 40 degrees needed for driving    Time 6    Period Weeks    Status New      PT SHORT TERM GOAL #4   Title The patient will report a 30% improvement in neck pain with usual ADLs    Time 6    Period Weeks    Status New               PT Long Term Goals - 09/13/20 1756       PT LONG TERM GOAL #1   Title The patient will be independent with safe self progression of HEP    Time 12    Period Weeks    Status New  Target Date 12/06/20      PT LONG TERM GOAL #2   Title The patient will be able to hold head in neutral for 2 minutes for household chores and yardwork (trimming limbs)    Time 12    Period Weeks    Status New      PT LONG  TERM GOAL #3   Title The patient will have improved cervical extension to 15 degrees and bil sidebending to 40 degrees needed for yardwork    Time 12    Period Weeks    Status New      PT LONG TERM GOAL #4   Title The patient will report a 60% improvement in pain and performance of ADLs    Time 12    Period Weeks    Status New      PT LONG TERM GOAL #5   Title FOTO functional outcome score improved to 61%    Time 12    Period Weeks    Status New                   Plan - 09/16/20 1962     Clinical Impression Statement Pt continues to present with signif flexed and forward head.  First follow up session since evaluation included review of HEP, STM to bil lateral and posterior cervical soft tissues, and addition of supine A/ROM for rotation and isometrics for cervical SB and Rot.  PT encouraged compliance with HEP at least 3 times daily (up to 5 times daily) to encourage more tolerance to upright posture when in gravity.  Pt rests in flexion, Lt SB and Lt rot but is able to correct toward Rt SB/Rt Rot in supine with VC to create neutral posture before ther ex is performed.  Pt has signif tone in Rt SCM and scalenes > Lt.  Good tolerance to today's session with ability to perform several reps of upright thoracic posture with partial neck retraction end of session.  Continue along POC.    Comorbidities history of CA base of tongue with radiation/chemo; COPD; HTN; lumbar fusion 2004;  left rotator cuff tear    PT Frequency 2x / week    PT Duration 12 weeks    PT Treatment/Interventions ADLs/Self Care Home Management;Aquatic Therapy;Cryotherapy;Electrical Stimulation;Ultrasound;Moist Heat;Therapeutic activities;Therapeutic exercise;Neuromuscular re-education;Manual techniques;Patient/family education;Dry needling;Taping    PT Next Visit Plan review HEP, needs several pillows in supine, neck extensor strengthening initially in supine (gravity assisted);  neck isometrics; soft tissue work  for anterior and posterior neck muscles;  after 3 month post -surgical mark he may be a candidate for DN    PT Home Exercise Plan [email or web address]    Consulted and Agree with Plan of Care Patient             Patient will benefit from skilled therapeutic intervention in order to improve the following deficits and impairments:  Pain, Decreased range of motion, Increased fascial restricitons, Decreased strength, Postural dysfunction, Impaired perceived functional ability, Decreased activity tolerance, Increased muscle spasms, Impaired UE functional use  Visit Diagnosis: Cervicalgia  Muscle weakness (generalized)  Abnormal posture     Problem List Patient Active Problem List   Diagnosis Date Noted   HNP (herniated nucleus pulposus) with myelopathy, cervical 05/27/2020   Positive FIT (fecal immunochemical test)    Polyp of ascending colon    Adenomatous polyp of sigmoid colon    Diverticulosis of colon without hemorrhage    Grade II internal hemorrhoids    Malignant  neoplasm of base of tongue ([place]) 10/27/2014   Chronic kidney disease    COPD (chronic obstructive pulmonary disease) ([place]) 07/10/2012   Chest pain 07/10/2012   HTN (hypertension) 07/10/2012   Hyponatremia 07/10/2012   TOBACCO USE 02/07/2010    Jeanne Terrance, PT 09/16/20 9:36 AM   [facility] [address] [place], [place], [address] Phone: [phone]   Fax:  [phone]  Name: George Nielsen MRN: [id number] Date of Birth: 1955/11/19

## 2020-09-20 ENCOUNTER — Ambulatory Visit: Payer: No Typology Code available for payment source

## 2020-09-20 ENCOUNTER — Other Ambulatory Visit: Payer: Self-pay

## 2020-09-20 DIAGNOSIS — M542 Cervicalgia: Secondary | ICD-10-CM | POA: Diagnosis not present

## 2020-09-20 DIAGNOSIS — M6281 Muscle weakness (generalized): Secondary | ICD-10-CM

## 2020-09-20 DIAGNOSIS — R293 Abnormal posture: Secondary | ICD-10-CM

## 2020-09-20 NOTE — Therapy (Signed)
[facility] [address], [place], [place], [address] Phone: [phone]   Fax:  [phone]  Physical Therapy Treatment  Patient Details  Name: George Nielsen MRN: [id number] Date of Birth: 07/25/1955 Referring Provider (PT): Dr. Christella Noa   Encounter Date: 09/20/2020   PT End of Session - 09/20/20 1139     Visit Number 3    Date for PT Re-Evaluation 12/06/20    Authorization Type 15 visits [facility]10[facility]14 [facility]    Authorization - Visit Number 3    Authorization - Number of Visits 15    PT Start Time 8366    PT Stop Time 1137    PT Time Calculation (min) 34 min    Activity Tolerance Patient limited by fatigue    Behavior During Therapy [facility] for tasks assessed/performed             Past Medical History:  Diagnosis Date   Allergy    Anemia    Anxiety    Arthritis    Cancer ([place])    tongue and neck lymp nodes   COPD (chronic obstructive pulmonary disease) ([place])    Dyspnea    climbing stairs, walking distance   Emphysema of lung ([place])    GERD (gastroesophageal reflux disease)    Hepatitis C    treated   History of radiation therapy 11/10/14- 12/29/14   BOT and bilateral neck/ 70 Gy in 35 fractions to gross disease, 63 Gy in 35 fractions to high risk nodal echelons, and 56 Gy in 35 fraction to intermediate risk nodal echelons.    Hypertension    Hypothyroidism    PNA (pneumonia)    4 times   Sleep apnea    wears CPAP sometimes per pt.   05/25/19 - not able to use at this time due to neck pain.   Thyroid disease    take Synthroid d/t scar tissue from radiation around thyroid   Ulcer     Past Surgical History:  Procedure Laterality Date   ANTERIOR CERVICAL DECOMP/DISCECTOMY FUSION N/A 05/27/2020   Procedure: Cervical Three-Four Anterior cervical decompression/discectomy/fusion;  Surgeon: Ashok Pall, MD;  Location: [facility];  Service: Neurosurgery;  Laterality: N/A;  anterior   BACK SURGERY     COLONOSCOPY  WITH PROPOFOL N/A 01/06/2020   Procedure: COLONOSCOPY WITH PROPOFOL;  Surgeon: Lavena Bullion, DO;  [facility];  Service: Gastroenterology;  Laterality: N/A;   ELBOW SURGERY Left    HEMOSTASIS CLIP PLACEMENT  01/06/2020   Procedure: HEMOSTASIS CLIP PLACEMENT;  Surgeon: Lavena Bullion, DO;  [facility];  Service: Gastroenterology;;   POLYPECTOMY  01/06/2020   Procedure: POLYPECTOMY;  Surgeon: Lavena Bullion, DO;  [facility];  Service: Gastroenterology;;   SHOULDER SURGERY     TONSILLECTOMY     tracheotomy     WRIST SURGERY     right    There were no vitals filed for this visit.   Subjective Assessment - 09/20/20 1104     Subjective I have discomfort, no pain.  I might have done too much of my exercises, I was sore.    Pertinent History lumbar fusion 2004   March 4th 2022   Radiation damage to throat--finished CA treatment (had chemo too) 5 years ago; left rotator cuff tear (plans to have surgery)    Currently in Pain? Yes    Pain Score 2    max of 4-5/10   Pain Location Neck    Pain Orientation  Right;Posterior    Pain Descriptors / Indicators Discomfort    Pain Onset More than a month ago    Pain Frequency Constant    Aggravating Factors  turning head, looking up    Pain Relieving Factors sleep, rest                               [facility] Adult PT Treatment/Exercise - 09/20/20 0001       Neck Exercises: Machines for Strengthening   UBE (Upper Arm Bike) Level 1x 4 minutes: 2/2- verbal cues for head upright      Neck Exercises: Theraband   Shoulder Extension 10 reps;Red    Shoulder Extension Limitations tactile cues for alignment    Rows 10 reps;Red    Rows Limitations tactile cues for alignment      Neck Exercises: Supine   Neck Retraction 10 reps;5 secs      Shoulder Exercises: Supine   Horizontal ABduction Strengthening;10 reps    Theraband Level (Shoulder Horizontal ABduction) Level 1 (Yellow)     Horizontal ABduction Limitations with cervical retraction    External Rotation Strengthening;Both;20 reps;Theraband    Theraband Level (Shoulder External Rotation) Level 1 (Yellow)    External Rotation Limitations with cervical retractin    Other Supine Exercises scapular retraction 10x5 sec holds    Other Supine Exercises supine shoulder press into table 10x5 sec                      PT Short Term Goals - 09/20/20 1107       PT SHORT TERM GOAL #1   Title The patient will demonstrate basic HEP to promote ROM and mobility of neck    Status On-going      PT SHORT TERM GOAL #2   Title The patient will be able to hold head in neutral for 1 minute for light household and yardwork    Status On-going      PT SHORT TERM GOAL #3   Title The patient will have improved cervical rotation to 40 degrees needed for driving      PT SHORT TERM GOAL #4   Title The patient will report a 30% improvement in neck pain with usual ADLs    Status On-going               PT Long Term Goals - 09/13/20 1756       PT LONG TERM GOAL #1   Title The patient will be independent with safe self progression of HEP    Time 12    Period Weeks    Status New    Target Date 12/06/20      PT LONG TERM GOAL #2   Title The patient will be able to hold head in neutral for 2 minutes for household chores and yardwork (trimming limbs)    Time 12    Period Weeks    Status New      PT LONG TERM GOAL #3   Title The patient will have improved cervical extension to 15 degrees and bil sidebending to 40 degrees needed for yardwork    Time 12    Period Weeks    Status New      PT LONG TERM GOAL #4   Title The patient will report a 60% improvement in pain and performance of ADLs    Time 12    Period Weeks  Status New      PT LONG TERM GOAL #5   Title FOTO functional outcome score improved to 61%    Time 12    Period Weeks    Status New                   Plan - 09/20/20 1117      Clinical Impression Statement Pt continues to present with significantly flexed and forward head.  Pt has been performing HEP 3-5x/day and reports some soreness after performing exercises.  Pt was able to complete some exercise against gravity and PT provided consistent verbal cues for alignment and scapular position.  Good tolerance to today's session although with increased exercise against gravity, pt fatigued at the end of session.  Pt will continue to benefit from skilled PT to address strength, posture and cervical mobility to improve function with daily tasks and driving.    PT Frequency 2x / week    PT Duration 12 weeks    PT Treatment/Interventions ADLs/Self Care Home Management;Aquatic Therapy;Cryotherapy;Electrical Stimulation;Ultrasound;Moist Heat;Therapeutic activities;Therapeutic exercise;Neuromuscular re-education;Manual techniques;Patient/family education;Dry needling;Taping    PT Next Visit Plan postural strength, cervical mobility, manual    PT Home Exercise Plan [id number]    Consulted and Agree with Plan of Care Patient             Patient will benefit from skilled therapeutic intervention in order to improve the following deficits and impairments:  Pain, Decreased range of motion, Increased fascial restricitons, Decreased strength, Postural dysfunction, Impaired perceived functional ability, Decreased activity tolerance, Increased muscle spasms, Impaired UE functional use  Visit Diagnosis: Muscle weakness (generalized)  Cervicalgia  Abnormal posture     Problem List Patient Active Problem List   Diagnosis Date Noted   HNP (herniated nucleus pulposus) with myelopathy, cervical 05/27/2020   Positive FIT (fecal immunochemical test)    Polyp of ascending colon    Adenomatous polyp of sigmoid colon    Diverticulosis of colon without hemorrhage    Grade II internal hemorrhoids    Malignant neoplasm of base of tongue ([place]) 10/27/2014   Chronic kidney disease    COPD  (chronic obstructive pulmonary disease) ([place]) 07/10/2012   Chest pain 07/10/2012   HTN (hypertension) 07/10/2012   Hyponatremia 07/10/2012   TOBACCO USE 02/07/2010   Sigurd Sos, PT 09/20/20 11:42 AM   [facility] [address] [place], [place], [address] Phone: [phone]   Fax:  [phone]  Name: George Nielsen MRN: [id number] Date of Birth: 24-Jun-1955

## 2020-09-22 ENCOUNTER — Encounter: Payer: Self-pay | Admitting: Physical Therapy

## 2020-09-22 ENCOUNTER — Ambulatory Visit: Payer: No Typology Code available for payment source | Admitting: Physical Therapy

## 2020-09-22 ENCOUNTER — Other Ambulatory Visit: Payer: Self-pay

## 2020-09-22 DIAGNOSIS — M542 Cervicalgia: Secondary | ICD-10-CM

## 2020-09-22 DIAGNOSIS — M6281 Muscle weakness (generalized): Secondary | ICD-10-CM

## 2020-09-22 NOTE — Therapy (Signed)
[facility] [address] [place], [place], [place], [address] Phone: [phone]   Fax:  [phone]  Physical Therapy Treatment  Patient Details  Name: George Nielsen MRN: [id number] Date of Birth: [birth year] Referring Provider (PT): Dr. Christella Noa   Encounter Date: 09/22/2020   PT End of Session - 09/22/20 1107     Visit Number 4    Number of Visits 15    Date for PT Re-Evaluation 12/06/20    Authorization Type 15 [facility]10[facility]    Authorization - Visit Number 4    Authorization - Number of Visits 15    PT Start Time 1101    PT Stop Time 1139    PT Time Calculation (min) 38 min    Activity Tolerance Patient limited by fatigue    Behavior During Therapy [facility] for tasks assessed/performed             Past Medical History:  Diagnosis Date   Allergy    Anemia    Anxiety    Arthritis    Cancer ([place])    tongue and neck lymp nodes   COPD (chronic obstructive pulmonary disease) ([place])    Dyspnea    climbing stairs, walking distance   Emphysema of lung ([place])    GERD (gastroesophageal reflux disease)    Hepatitis C    treated   History of radiation therapy 11/10/14- 12/29/14   BOT and bilateral neck/ 70 Gy in 35 fractions to gross disease, 63 Gy in 35 fractions to high risk nodal echelons, and 56 Gy in 35 fraction to intermediate risk nodal echelons.    Hypertension    Hypothyroidism    PNA (pneumonia)    4 times   Sleep apnea    wears CPAP sometimes per pt.   05/25/19 - not able to use at this time due to neck pain.   Thyroid disease    take Synthroid d/t scar tissue from radiation around thyroid   Ulcer     Past Surgical History:  Procedure Laterality Date   ANTERIOR CERVICAL DECOMP/DISCECTOMY FUSION N/A 05/27/2020   Procedure: Cervical Three-Four Anterior cervical decompression/discectomy/fusion;  Surgeon: Ashok Pall, MD;  Location: [facility];  Service: Neurosurgery;  Laterality: N/A;  anterior   BACK  SURGERY     COLONOSCOPY WITH PROPOFOL N/A 01/06/2020   Procedure: COLONOSCOPY WITH PROPOFOL;  Surgeon: Lavena Bullion, DO;  [facility];  Service: Gastroenterology;  Laterality: N/A;   ELBOW SURGERY Left    HEMOSTASIS CLIP PLACEMENT  01/06/2020   Procedure: HEMOSTASIS CLIP PLACEMENT;  Surgeon: Lavena Bullion, DO;  [facility];  Service: Gastroenterology;;   POLYPECTOMY  01/06/2020   Procedure: POLYPECTOMY;  Surgeon: Lavena Bullion, DO;  [facility];  Service: Gastroenterology;;   SHOULDER SURGERY     TONSILLECTOMY     tracheotomy     WRIST SURGERY     right    There were no vitals filed for this visit.   Subjective Assessment - 09/22/20 1102     Subjective I have discomfort, no pain. Pt reports he was able to complete HEP since last visit and these went well.    Pertinent History lumbar fusion 2004   March 4th 2022   Radiation damage to throat--finished CA treatment (had chemo too) 5 years ago; left rotator cuff tear (plans to have surgery)    Limitations Sitting    How long can you sit comfortably? a good while  How long can you walk comfortably? My head hangs down    Diagnostic tests MRI    Patient Stated Goals more movement; less pain; hold head up    Currently in Pain? Yes    Pain Score 2     Pain Location Neck    Pain Orientation Right;Posterior    Pain Descriptors / Indicators Discomfort    Pain Type Surgical pain    Pain Onset More than a month ago    Pain Frequency Constant    Aggravating Factors  turning head, looking up    Pain Relieving Factors sleep rest                               [facility] Adult PT Treatment/Exercise - 09/22/20 0001       Exercises   Exercises Shoulder;Neck      Neck Exercises: Machines for Strengthening   UBE (Upper Arm Bike) L1 2 mins forward/ backward   VC for looking up throughout     Neck Exercises: Theraband   Shoulder Extension 10 reps;Red    Shoulder Extension  Limitations tactile cues for cervical alignment    Rows 10 reps;Red   seated   Rows Limitations tactile cues for cervical alignment      Neck Exercises: Standing   Neck Retraction --      Neck Exercises: Seated   Cervical Isometrics Right lateral flexion;Left lateral flexion;Left rotation;Right rotation;5 secs;5 reps    Cervical Isometrics Limitations cues for alignment    Neck Retraction 10 reps    Cervical Rotation Both;10 reps    Lateral Flexion Both;10 reps    X to V 20 reps   10 in seated and 10 in standing   Shoulder Rolls --    Other Seated Exercise cervical retractions 2x10      Neck Exercises: Supine   Cervical Isometrics --    Neck Retraction --    Cervical Rotation --      Shoulder Exercises: Seated   Abduction Both;20 reps;Theraband    Theraband Level (Shoulder ABduction) Level 2 (Red)    Diagonals Both;20 reps    Theraband Level (Shoulder Diagonals) Level 2 (Red)      Shoulder Exercises: Standing   Flexion 10 reps;Both    Shoulder Flexion Weight (lbs) 2#    ABduction --    Theraband Level (Shoulder ABduction) --      Manual Therapy   Manual Therapy Soft tissue mobilization    Manual therapy comments supine with 2 pillows and towel prop between both pillows    Soft tissue mobilization bil scalenes cervical occipitals and levators                    PT Education - 09/22/20 1144     Education Details Pt educated on crevical retractions in supine and use of heat for improved muslce relaxation and attempting to maintain upward head carriage throughout the day for improved posture and stretching at neck.    Person(s) Educated Patient    Methods Explanation;Demonstration;Verbal cues;Tactile cues    Comprehension Verbalized understanding;Returned demonstration              PT Short Term Goals - 09/20/20 1107       PT SHORT TERM GOAL #1   Title The patient will demonstrate basic HEP to promote ROM and mobility of neck    Status On-going       PT SHORT  TERM GOAL #2   Title The patient will be able to hold head in neutral for 1 minute for light household and yardwork    Status On-going      PT SHORT TERM GOAL #3   Title The patient will have improved cervical rotation to 40 degrees needed for driving      PT SHORT TERM GOAL #4   Title The patient will report a 30% improvement in neck pain with usual ADLs    Status On-going               PT Long Term Goals - 09/13/20 1756       PT LONG TERM GOAL #1   Title The patient will be independent with safe self progression of HEP    Time 12    Period Weeks    Status New    Target Date 12/06/20      PT LONG TERM GOAL #2   Title The patient will be able to hold head in neutral for 2 minutes for household chores and yardwork (trimming limbs)    Time 12    Period Weeks    Status New      PT LONG TERM GOAL #3   Title The patient will have improved cervical extension to 15 degrees and bil sidebending to 40 degrees needed for yardwork    Time 12    Period Weeks    Status New      PT LONG TERM GOAL #4   Title The patient will report a 60% improvement in pain and performance of ADLs    Time 12    Period Weeks    Status New      PT LONG TERM GOAL #5   Title FOTO functional outcome score improved to 61%    Time 12    Period Weeks    Status New                   Plan - 09/22/20 1107     Clinical Impression Statement Pt presents to clinic with continued profound forward head carriage. Pt has been completing HEP and reports this hasn't cause any increase in pain but notes some soreness but resolves. Pt's session emphasized posture and head carriage based exericses and pt able to complet exercises this session in sitting and attempted two exercises in standing with pt able to complete 10 reps but required to return to poor posture to recover and sitting. Pt benefits from verbal cues for alignment and head carriage. Pt tolerated session well but would benefit from  continued PT for improved posture, head carriage, cervical ROM and strengthening to improve function at home and driving.    Personal Factors and Comorbidities Comorbidity 1;Comorbidity 2;Comorbidity 3+    Comorbidities history of CA base of tongue with radiation/chemo; COPD; HTN; lumbar fusion 2004;  left rotator cuff tear    Examination-Activity Limitations Reach Overhead;Carry;Lift;Dressing;Other    Examination-Participation Restrictions Cleaning;Driving;Yard Work;Shop;Community Activity    Stability/Clinical Decision Making Evolving/Moderate complexity    Clinical Decision Making Moderate    Rehab Potential Good    PT Frequency 2x / week    PT Duration 12 weeks    PT Treatment/Interventions ADLs/Self Care Home Management;Aquatic Therapy;Cryotherapy;Electrical Stimulation;Ultrasound;Moist Heat;Therapeutic activities;Therapeutic exercise;Neuromuscular re-education;Manual techniques;Patient/family education;Dry needling;Taping    PT Next Visit Plan postural strength, cervical mobility, manual    Consulted and Agree with Plan of Care Patient  Patient will benefit from skilled therapeutic intervention in order to improve the following deficits and impairments:  Pain, Decreased range of motion, Increased fascial restricitons, Decreased strength, Postural dysfunction, Impaired perceived functional ability, Decreased activity tolerance, Increased muscle spasms, Impaired UE functional use  Visit Diagnosis: Cervicalgia  Muscle weakness (generalized)     Problem List Patient Active Problem List   Diagnosis Date Noted   HNP (herniated nucleus pulposus) with myelopathy, cervical 05/27/2020   Positive FIT (fecal immunochemical test)    Polyp of ascending colon    Adenomatous polyp of sigmoid colon    Diverticulosis of colon without hemorrhage    Grade II internal hemorrhoids    Malignant neoplasm of base of tongue ([place]) 10/27/2014   Chronic kidney disease    COPD (chronic  obstructive pulmonary disease) ([place]) 07/10/2012   Chest pain 07/10/2012   HTN (hypertension) 07/10/2012   Hyponatremia 07/10/2012   TOBACCO USE 02/07/2010    Stacy Gardner, PT 09/23/2210:00 PM   [facility] [address], [place], [place], [address] Phone: [phone]   Fax:  [phone]  Name: JERUSALEM WERT MRN: [id number] Date of Birth: 1955-12-12

## 2020-09-27 ENCOUNTER — Ambulatory Visit: Payer: No Typology Code available for payment source | Attending: Neurosurgery | Admitting: Physical Therapy

## 2020-09-27 ENCOUNTER — Other Ambulatory Visit: Payer: Self-pay

## 2020-09-27 ENCOUNTER — Encounter: Payer: Self-pay | Admitting: Physical Therapy

## 2020-09-27 DIAGNOSIS — R252 Cramp and spasm: Secondary | ICD-10-CM | POA: Insufficient documentation

## 2020-09-27 DIAGNOSIS — R2689 Other abnormalities of gait and mobility: Secondary | ICD-10-CM | POA: Insufficient documentation

## 2020-09-27 DIAGNOSIS — M6281 Muscle weakness (generalized): Secondary | ICD-10-CM | POA: Diagnosis present

## 2020-09-27 DIAGNOSIS — R293 Abnormal posture: Secondary | ICD-10-CM | POA: Diagnosis present

## 2020-09-27 DIAGNOSIS — M542 Cervicalgia: Secondary | ICD-10-CM | POA: Diagnosis not present

## 2020-09-27 NOTE — Therapy (Signed)
[facility] [address], [place], [place], [address] Phone: [phone]   Fax:  [phone]  Physical Therapy Treatment  Patient Details  Name: George Nielsen MRN: [id number] Date of Birth: 1955-06-13 Referring Provider (PT): Dr. Christella Noa   Encounter Date: 09/27/2020   PT End of Session - 09/27/20 1049     Visit Number 5    Number of Visits 15    Date for PT Re-Evaluation 12/06/20    Authorization Type 15 [facility]14 [facility]    Authorization - Visit Number 5    Authorization - Number of Visits 15    PT Start Time 2993    PT Stop Time 1058    PT Time Calculation (min) 43 min    Activity Tolerance Patient limited by fatigue;Patient limited by pain    Behavior During Therapy [facility] for tasks assessed/performed             Past Medical History:  Diagnosis Date   Allergy    Anemia    Anxiety    Arthritis    Cancer ([place])    tongue and neck lymp nodes   COPD (chronic obstructive pulmonary disease) ([place])    Dyspnea    climbing stairs, walking distance   Emphysema of lung ([place])    GERD (gastroesophageal reflux disease)    Hepatitis C    treated   History of radiation therapy 11/10/14- 12/29/14   BOT and bilateral neck/ 70 Gy in 35 fractions to gross disease, 63 Gy in 35 fractions to high risk nodal echelons, and 56 Gy in 35 fraction to intermediate risk nodal echelons.    Hypertension    Hypothyroidism    PNA (pneumonia)    4 times   Sleep apnea    wears CPAP sometimes per pt.   05/25/19 - not able to use at this time due to neck pain.   Thyroid disease    take Synthroid d/t scar tissue from radiation around thyroid   Ulcer     Past Surgical History:  Procedure Laterality Date   ANTERIOR CERVICAL DECOMP/DISCECTOMY FUSION N/A 05/27/2020   Procedure: Cervical Three-Four Anterior cervical decompression/discectomy/fusion;  Surgeon: Ashok Pall, MD;  Location: [place];  Service: Neurosurgery;  Laterality: N/A;   anterior   BACK SURGERY     COLONOSCOPY WITH PROPOFOL N/A 01/06/2020   Procedure: COLONOSCOPY WITH PROPOFOL;  Surgeon: Lavena Bullion, DO;  [facility];  Service: Gastroenterology;  Laterality: N/A;   ELBOW SURGERY Left    HEMOSTASIS CLIP PLACEMENT  01/06/2020   Procedure: HEMOSTASIS CLIP PLACEMENT;  Surgeon: Lavena Bullion, DO;  [facility];  Service: Gastroenterology;;   POLYPECTOMY  01/06/2020   Procedure: POLYPECTOMY;  Surgeon: Lavena Bullion, DO;  [facility];  Service: Gastroenterology;;   SHOULDER SURGERY     TONSILLECTOMY     tracheotomy     WRIST SURGERY     right    There were no vitals filed for this visit.   Subjective Assessment - 09/27/20 1025     Subjective I have discomfort, no pain. Pt reports he was able to complete HEP since last visit and these went well. Pt reports he has been able to hold his head more upright since starting PT.    Pertinent History lumbar fusion 2004   March 4th 2022   Radiation damage to throat--finished CA treatment (had chemo too) 5 years ago; left rotator cuff tear (plans to have surgery)  Limitations Sitting    How long can you sit comfortably? a good while    How long can you walk comfortably? My head hangs down    Diagnostic tests MRI    Patient Stated Goals more movement; less pain; hold head up    Currently in Pain? Yes    Pain Score 3     Pain Location Neck    Pain Orientation Right;Posterior    Pain Descriptors / Indicators Discomfort    Pain Type Surgical pain    Pain Onset More than a month ago    Pain Frequency Constant    Aggravating Factors  turning head, looking up    Pain Relieving Factors sleep and rest                               [facility] Adult PT Treatment/Exercise - 09/27/20 0001       Exercises   Exercises Shoulder;Neck      Neck Exercises: Machines for Strengthening   UBE (Upper Arm Bike) L1.2 2 mins forward/ backward   VC for looking up  throughout     Neck Exercises: Seated   Cervical Isometrics Right lateral flexion;Left lateral flexion;Left rotation;Right rotation;1 rep;Other (comment)   45s each   Cervical Isometrics Limitations cervical flexion and extension within comfort 1x45s    Neck Retraction 10 reps    Shoulder Shrugs 20 reps    Shoulder Rolls 20 reps      Neck Exercises: Supine   Neck Retraction 10 reps;3 secs      Shoulder Exercises: Supine   Horizontal ABduction Strengthening;10 reps    Theraband Level (Shoulder Horizontal ABduction) Level 2 (Red)    Horizontal ABduction Limitations with cervical retraction    Other Supine Exercises scapular retraction 10x5 sec holds    Other Supine Exercises supine seratus anterior press into table with red band      Manual Therapy   Manual Therapy Soft tissue mobilization    Manual therapy comments supine with 2 pillows    Soft tissue mobilization bil scalenes cervical occipitals and levators    Passive ROM for gentle rotation and lateral flexion without over pressure and only to pt's end range                    PT Education - 09/27/20 1048     Education Details Pt educated on gentle stretches for home with addition to HEP made, and pt verbalized understanding, and midline head carriage with pt unaware of his resting head to Lt as well as to continue to check in with posture and head carriage throughout the day    Person(s) Educated Patient    Methods Explanation;Demonstration;Handout;Verbal cues;Tactile cues    Comprehension Verbalized understanding;Returned demonstration              PT Short Term Goals - 09/20/20 1107       PT SHORT TERM GOAL #1   Title The patient will demonstrate basic HEP to promote ROM and mobility of neck    Status On-going      PT SHORT TERM GOAL #2   Title The patient will be able to hold head in neutral for 1 minute for light household and yardwork    Status On-going      PT SHORT TERM GOAL #3   Title The  patient will have improved cervical rotation to 40 degrees needed for driving  PT SHORT TERM GOAL #4   Title The patient will report a 30% improvement in neck pain with usual ADLs    Status On-going               PT Long Term Goals - 09/13/20 1756       PT LONG TERM GOAL #1   Title The patient will be independent with safe self progression of HEP    Time 12    Period Weeks    Status New    Target Date 12/06/20      PT LONG TERM GOAL #2   Title The patient will be able to hold head in neutral for 2 minutes for household chores and yardwork (trimming limbs)    Time 12    Period Weeks    Status New      PT LONG TERM GOAL #3   Title The patient will have improved cervical extension to 15 degrees and bil sidebending to 40 degrees needed for yardwork    Time 12    Period Weeks    Status New      PT LONG TERM GOAL #4   Title The patient will report a 60% improvement in pain and performance of ADLs    Time 12    Period Weeks    Status New      PT LONG TERM GOAL #5   Title FOTO functional outcome score improved to 61%    Time 12    Period Weeks    Status New                   Plan - 09/27/20 1049     Clinical Impression Statement Pt presents to clinic with continued profound forward head carriage and noted to rest toward Lt side. Pt reported he has been completeing HEP and given additional gentle cervical stretches and educated on how to complete with verbal cues and visual demonstration. Pt continues to demonstrate benefit for skiller PT for posture, improved cervical strength and mobility and decreased pain for improved functional mobility for ADLs and driving.    Personal Factors and Comorbidities Comorbidity 1;Comorbidity 2;Comorbidity 3+    Comorbidities history of CA base of tongue with radiation/chemo; COPD; HTN; lumbar fusion 2004;  left rotator cuff tear    Examination-Activity Limitations Reach Overhead;Carry;Lift;Dressing;Other     Examination-Participation Restrictions Cleaning;Driving;Yard Work;Shop;Community Activity    Stability/Clinical Decision Making Evolving/Moderate complexity    Clinical Decision Making Moderate    Rehab Potential Good    PT Frequency 2x / week    PT Duration 12 weeks    PT Treatment/Interventions ADLs/Self Care Home Management;Aquatic Therapy;Cryotherapy;Electrical Stimulation;Ultrasound;Moist Heat;Therapeutic activities;Therapeutic exercise;Neuromuscular re-education;Manual techniques;Patient/family education;Dry needling;Taping    PT Next Visit Plan postural strength, cervical mobility, manual    PT Home Exercise Plan [id number]    Consulted and Agree with Plan of Care Patient             Patient will benefit from skilled therapeutic intervention in order to improve the following deficits and impairments:  Pain, Decreased range of motion, Increased fascial restricitons, Decreased strength, Postural dysfunction, Impaired perceived functional ability, Decreased activity tolerance, Increased muscle spasms, Impaired UE functional use  Visit Diagnosis: Cervicalgia  Muscle weakness (generalized)  Other abnormalities of gait and mobility     Problem List Patient Active Problem List   Diagnosis Date Noted   HNP (herniated nucleus pulposus) with myelopathy, cervical 05/27/2020   Positive FIT (fecal immunochemical test)  Polyp of ascending colon    Adenomatous polyp of sigmoid colon    Diverticulosis of colon without hemorrhage    Grade II internal hemorrhoids    Malignant neoplasm of base of tongue ([place]) 10/27/2014   Chronic kidney disease    COPD (chronic obstructive pulmonary disease) ([place]) 07/10/2012   Chest pain 07/10/2012   HTN (hypertension) 07/10/2012   Hyponatremia 07/10/2012   TOBACCO USE 02/07/2010    Stacy Gardner, PT 09/27/2208:59 AM   [facility] [address], [place], [place], [address] Phone:  [phone]   Fax:  [phone]  Name: George Nielsen MRN: [id number] Date of Birth: 11-16-1955

## 2020-10-04 ENCOUNTER — Ambulatory Visit: Payer: No Typology Code available for payment source | Admitting: Physical Therapy

## 2020-10-04 ENCOUNTER — Other Ambulatory Visit: Payer: Self-pay

## 2020-10-04 DIAGNOSIS — R2689 Other abnormalities of gait and mobility: Secondary | ICD-10-CM

## 2020-10-04 DIAGNOSIS — M6281 Muscle weakness (generalized): Secondary | ICD-10-CM

## 2020-10-04 DIAGNOSIS — M542 Cervicalgia: Secondary | ICD-10-CM

## 2020-10-04 DIAGNOSIS — R293 Abnormal posture: Secondary | ICD-10-CM

## 2020-10-04 NOTE — Patient Instructions (Signed)

## 2020-10-04 NOTE — Therapy (Signed)
[facility] [address], [place], [place], [address] Phone: [phone]   Fax:  [phone]  Physical Therapy Treatment  Patient Details  Name: George Nielsen MRN: [id number] Date of Birth: 06-11-1955 Referring Provider (PT): Dr. Christella Noa   Encounter Date: 10/04/2020   PT End of Session - 10/04/20 1925     Visit Number 6    Number of Visits 15    Date for PT Re-Evaluation 12/06/20    Authorization Type 15 [facility]/14 [facility]    Authorization - Visit Number 6    Authorization - Number of Visits 15    PT Start Time 1100    PT Stop Time 1145    PT Time Calculation (min) 45 min    Activity Tolerance Patient tolerated treatment well             Past Medical History:  Diagnosis Date   Allergy    Anemia    Anxiety    Arthritis    Cancer ([place])    tongue and neck lymp nodes   COPD (chronic obstructive pulmonary disease) ([place])    Dyspnea    climbing stairs, walking distance   Emphysema of lung ([place])    GERD (gastroesophageal reflux disease)    Hepatitis C    treated   History of radiation therapy 11/10/14- 12/29/14   BOT and bilateral neck/ 70 Gy in 35 fractions to gross disease, 63 Gy in 35 fractions to high risk nodal echelons, and 56 Gy in 35 fraction to intermediate risk nodal echelons.    Hypertension    Hypothyroidism    PNA (pneumonia)    4 times   Sleep apnea    wears CPAP sometimes per pt.   05/25/19 - not able to use at this time due to neck pain.   Thyroid disease    take Synthroid d/t scar tissue from radiation around thyroid   Ulcer     Past Surgical History:  Procedure Laterality Date   ANTERIOR CERVICAL DECOMP/DISCECTOMY FUSION N/A 05/27/2020   Procedure: Cervical Three-Four Anterior cervical decompression/discectomy/fusion;  Surgeon: Ashok Pall, MD;  Location: [place];  Service: Neurosurgery;  Laterality: N/A;  anterior   BACK SURGERY     COLONOSCOPY WITH PROPOFOL N/A 01/06/2020    Procedure: COLONOSCOPY WITH PROPOFOL;  Surgeon: Lavena Bullion, DO;  [facility];  Service: Gastroenterology;  Laterality: N/A;   ELBOW SURGERY Left    HEMOSTASIS CLIP PLACEMENT  01/06/2020   Procedure: HEMOSTASIS CLIP PLACEMENT;  Surgeon: Lavena Bullion, DO;  [facility];  Service: Gastroenterology;;   POLYPECTOMY  01/06/2020   Procedure: POLYPECTOMY;  Surgeon: Lavena Bullion, DO;  [facility];  Service: Gastroenterology;;   SHOULDER SURGERY     TONSILLECTOMY     tracheotomy     WRIST SURGERY     right    There were no vitals filed for this visit.   Subjective Assessment - 10/04/20 1102     Subjective Not much pain.  Headache with overdoing it like turning head a lot with city driving.  Sleeping OK.  Looking up painful during the motion but not lasting.  I don't really like exercise.    Pertinent History lumbar fusion 2004   March 4th 2022   Radiation damage to throat--finished CA treatment (had chemo too) 5 years ago; left rotator cuff tear (plans to have surgery)    Currently in Pain? Yes    Pain Score 2  Pain Location Neck                [facility] PT Assessment - 10/04/20 0001       AROM   Cervical Extension 0    Cervical - Right Side Bend 45    Cervical - Left Side Bend 45    Cervical - Right Rotation 25    Cervical - Left Rotation 25                           [facility] Adult PT Treatment/Exercise - 10/04/20 0001       Neck Exercises: Seated   Other Seated Exercise seated with pool noodle with erect posture    Other Seated Exercise thoracic extension with ball 10x right hand supporting neck      Neck Exercises: Supine   Cervical Rotation Both;10 reps    Other Supine Exercise propped on wedge:cervical rotation 8x; cervical retraction 10x    Other Supine Exercise propped on wedge with scap retract and elbow press down 10x      Shoulder Exercises: Supine   Other Supine Exercises discontinued bil UE  elevation secondary to left shoulder pain    Other Supine Exercises propped on wedge: golf club press 4# 10x      Moist Heat Therapy   Number Minutes Moist Heat 10 Minutes    Moist Heat Location --   after DN and concurrent with ex     Manual Therapy   Soft tissue mobilization bil cervical paraspinals, upper traps and suboccipitals    Passive ROM for gentle rotation and lateral flexion without over pressure and only to pt's end range              Trigger Point Dry Needling - 10/04/20 0001     Consent Given? Yes    Education Handout Provided Yes    Muscles Treated Head and Neck Upper trapezius;Suboccipitals;Cervical multifidi    Other Dry Needling bil    Upper Trapezius Response Twitch reponse elicited    Suboccipitals Response Palpable increased muscle length    Cervical multifidi Response Palpable increased muscle length                  PT Education - 10/04/20 1925     Education Details dry needling after care    Person(s) Educated Patient    Methods Explanation;Handout    Comprehension Verbalized understanding              PT Short Term Goals - 09/20/20 1107       PT SHORT TERM GOAL #1   Title The patient will demonstrate basic HEP to promote ROM and mobility of neck    Status On-going      PT SHORT TERM GOAL #2   Title The patient will be able to hold head in neutral for 1 minute for light household and yardwork    Status On-going      PT SHORT TERM GOAL #3   Title The patient will have improved cervical rotation to 40 degrees needed for driving      PT SHORT TERM GOAL #4   Title The patient will report a 30% improvement in neck pain with usual ADLs    Status On-going               PT Long Term Goals - 09/13/20 1756       PT LONG TERM GOAL #1   Title The  patient will be independent with safe self progression of HEP    Time 12    Period Weeks    Status New    Target Date 12/06/20      PT LONG TERM GOAL #2   Title The patient  will be able to hold head in neutral for 2 minutes for household chores and yardwork (trimming limbs)    Time 12    Period Weeks    Status New      PT LONG TERM GOAL #3   Title The patient will have improved cervical extension to 15 degrees and bil sidebending to 40 degrees needed for yardwork    Time 12    Period Weeks    Status New      PT LONG TERM GOAL #4   Title The patient will report a 60% improvement in pain and performance of ADLs    Time 12    Period Weeks    Status New      PT LONG TERM GOAL #5   Title FOTO functional outcome score improved to 61%    Time 12    Period Weeks    Status New                   Plan - 10/04/20 1926     Clinical Impression Statement Continues to  have shortened cervical soft tissue which is slow to improve.  He is receptive to a trial of dry needling to address this issue.   Prone lying even with open face piece and a pillow under chest is challenging for him therefore limited muscles treated today with DN.   Good initial response particularly with bil sidebending ROM.   Ex's modified secondary to left shoulder pain.  Therapist monitoring response with all interventions.    Comorbidities history of CA base of tongue with radiation/chemo; COPD; HTN; lumbar fusion 2004;  left rotator cuff tear    Examination-Participation Restrictions Cleaning;Driving;Yard Work;Shop;Community Activity    Rehab Potential Good    PT Frequency 2x / week    PT Duration 12 weeks    PT Treatment/Interventions ADLs/Self Care Home Management;Aquatic Therapy;Cryotherapy;Electrical Stimulation;Ultrasound;Moist Heat;Therapeutic activities;Therapeutic exercise;Neuromuscular re-education;Manual techniques;Patient/family education;Dry needling;Taping    PT Next Visit Plan asses response to DN#1; may benefit from DN to SCM and scalenes as well; propped on wedge for ex; try prone or quadruped head lifts;  postural strength, cervical mobility, manual    PT Home Exercise  Plan [id number]             Patient will benefit from skilled therapeutic intervention in order to improve the following deficits and impairments:  Pain, Decreased range of motion, Increased fascial restricitons, Decreased strength, Postural dysfunction, Impaired perceived functional ability, Decreased activity tolerance, Increased muscle spasms, Impaired UE functional use  Visit Diagnosis: Cervicalgia  Muscle weakness (generalized)  Other abnormalities of gait and mobility  Abnormal posture     Problem List Patient Active Problem List   Diagnosis Date Noted   HNP (herniated nucleus pulposus) with myelopathy, cervical 05/27/2020   Positive FIT (fecal immunochemical test)    Polyp of ascending colon    Adenomatous polyp of sigmoid colon    Diverticulosis of colon without hemorrhage    Grade II internal hemorrhoids    Malignant neoplasm of base of tongue ([facility]) 10/27/2014   Chronic kidney disease    COPD (chronic obstructive pulmonary disease) ([place]) 07/10/2012   Chest pain 07/10/2012   HTN (hypertension) 07/10/2012   Hyponatremia  07/10/2012   TOBACCO USE 02/07/2010   Ruben Im, PT 10/04/20 7:34 PM Phone: [phone] Fax: [phone]  Alvera Singh 10/04/2020, 7:33 PM  [facility] [address] [place], [place], [address] Phone: [phone]   Fax:  [phone]  Name: George Nielsen MRN: [id number] Date of Birth: Aug 16, 1955

## 2020-10-06 ENCOUNTER — Ambulatory Visit: Payer: No Typology Code available for payment source | Admitting: Physical Therapy

## 2020-10-06 ENCOUNTER — Other Ambulatory Visit: Payer: Self-pay

## 2020-10-06 DIAGNOSIS — R2689 Other abnormalities of gait and mobility: Secondary | ICD-10-CM

## 2020-10-06 DIAGNOSIS — M542 Cervicalgia: Secondary | ICD-10-CM | POA: Diagnosis not present

## 2020-10-06 DIAGNOSIS — M6281 Muscle weakness (generalized): Secondary | ICD-10-CM

## 2020-10-06 DIAGNOSIS — R293 Abnormal posture: Secondary | ICD-10-CM

## 2020-10-06 NOTE — Therapy (Signed)
[facility] [address] [place], [place], [place], [address] Phone: [phone]   Fax:  [phone]  Physical Therapy Treatment  Patient Details  Name: George Nielsen MRN: [id number] Date of Birth: 21-Jan-1956 Referring Provider (PT): Dr. Christella Noa   Encounter Date: 10/06/2020   PT End of Session - 10/06/20 1059     Visit Number 7    Number of Visits 15    Date for PT Re-Evaluation 12/06/20    Authorization Type 15 [facility]    Authorization - Visit Number 7    Authorization - Number of Visits 15    PT Start Time 7591    PT Stop Time 1055    PT Time Calculation (min) 40 min    Activity Tolerance Patient tolerated treatment well             Past Medical History:  Diagnosis Date   Allergy    Anemia    Anxiety    Arthritis    Cancer ([facility])    tongue and neck lymp nodes   COPD (chronic obstructive pulmonary disease) ([place])    Dyspnea    climbing stairs, walking distance   Emphysema of lung ([place])    GERD (gastroesophageal reflux disease)    Hepatitis C    treated   History of radiation therapy 11/10/14- 12/29/14   BOT and bilateral neck/ 70 Gy in 35 fractions to gross disease, 63 Gy in 35 fractions to high risk nodal echelons, and 56 Gy in 35 fraction to intermediate risk nodal echelons.    Hypertension    Hypothyroidism    PNA (pneumonia)    4 times   Sleep apnea    wears CPAP sometimes per pt.   05/25/19 - not able to use at this time due to neck pain.   Thyroid disease    take Synthroid d/t scar tissue from radiation around thyroid   Ulcer     Past Surgical History:  Procedure Laterality Date   ANTERIOR CERVICAL DECOMP/DISCECTOMY FUSION N/A 05/27/2020   Procedure: Cervical Three-Four Anterior cervical decompression/discectomy/fusion;  Surgeon: Ashok Pall, MD;  Location: [facility];  Service: Neurosurgery;  Laterality: N/A;  anterior   BACK SURGERY     COLONOSCOPY WITH PROPOFOL N/A 01/06/2020    Procedure: COLONOSCOPY WITH PROPOFOL;  Surgeon: Lavena Bullion, DO;  Location: [facility];  Service: Gastroenterology;  Laterality: N/A;   ELBOW SURGERY Left    HEMOSTASIS CLIP PLACEMENT  01/06/2020   Procedure: HEMOSTASIS CLIP PLACEMENT;  Surgeon: Lavena Bullion, DO;  Location: [facility];  Service: Gastroenterology;;   POLYPECTOMY  01/06/2020   Procedure: POLYPECTOMY;  Surgeon: Lavena Bullion, DO;  [facility];  Service: Gastroenterology;;   SHOULDER SURGERY     TONSILLECTOMY     tracheotomy     WRIST SURGERY     right    There were no vitals filed for this visit.   Subjective Assessment - 10/06/20 1009     Subjective I liked the DN it made my muscles feel relaxed.  I don't know if the motion changed though.  Discomfort with looking up.  Tight/tender right anterior neck.    Pertinent History lumbar fusion 2004   March 4th 2022   Radiation damage to throat--finished CA treatment (had chemo too) 5 years ago; left rotator cuff tear (plans to have surgery)    Currently in Pain? Yes    Pain Score 2     Pain Location Neck  Pain Orientation Right;Anterior;Posterior;Upper    Pain Type Surgical pain                               [facility] Adult PT Treatment/Exercise - 10/06/20 0001       Neck Exercises: Supine   Cervical Rotation Both;10 reps   propped on wedge   Other Supine Exercise propped on wedge:cervical rotation 8x; cervical retraction 10x    Other Supine Exercise propped on wedge with scap retract and elbow press down 10x      Neck Exercises: Prone   Other Prone Exercise quadruped head lift to neutral 2x 5    Other Prone Exercise quadruped scapular protract/retract 5x      Shoulder Exercises: Supine   Other Supine Exercises Propped on wedge therapist holding green band overhead:elbow extensions, rows and bil shoulder extension 10x each    Other Supine Exercises propped on wedge: golf club press 4# 10x      Shoulder Exercises:  Seated   Other Seated Exercises in chair with ball push with back of the head 2x10    Other Seated Exercises yellow band around back of head pushes 2x 10 (better with arms fixed into sides of body)      Manual Therapy   Soft tissue mobilization assessment of SCM and scalenes to determine if could be DN in future                    PT Education - 10/06/20 1059     Education Details yellow band head push seated    Person(s) Educated Patient    Methods Explanation;Demonstration;Handout    Comprehension Returned demonstration;Verbalized understanding              PT Short Term Goals - 09/20/20 1107       PT SHORT TERM GOAL #1   Title The patient will demonstrate basic HEP to promote ROM and mobility of neck    Status On-going      PT SHORT TERM GOAL #2   Title The patient will be able to hold head in neutral for 1 minute for light household and yardwork    Status On-going      PT SHORT TERM GOAL #3   Title The patient will have improved cervical rotation to 40 degrees needed for driving      PT SHORT TERM GOAL #4   Title The patient will report a 30% improvement in neck pain with usual ADLs    Status On-going               PT Long Term Goals - 09/13/20 1756       PT LONG TERM GOAL #1   Title The patient will be independent with safe self progression of HEP    Time 12    Period Weeks    Status New    Target Date 12/06/20      PT LONG TERM GOAL #2   Title The patient will be able to hold head in neutral for 2 minutes for household chores and yardwork (trimming limbs)    Time 12    Period Weeks    Status New      PT LONG TERM GOAL #3   Title The patient will have improved cervical extension to 15 degrees and bil sidebending to 40 degrees needed for yardwork    Time 12    Period Weeks  Status New      PT LONG TERM GOAL #4   Title The patient will report a 60% improvement in pain and performance of ADLs    Time 12    Period Weeks    Status  New      PT LONG TERM GOAL #5   Title FOTO functional outcome score improved to 61%    Time 12    Period Weeks    Status New                   Plan - 10/06/20 1100     Clinical Impression Statement The patient reports some improvement in discomfort/tightness in posterior neck soft tissue but continues to have poor motor control with holding his head in neutral.  Shortened right SCM and scalene muscles which as difficult to palpate not only due to his neck surgery but also the residual effects of radiation to this area.  Somewhat easier to palpate in supine vs. seated.  He is able to perform ex's propped on wedge in supine and initiate seated cervical extension/retraction isometrics and quaduped head lifts in short bouts.  He fatigues rather quickly in sitting and also in quadruped. Limited left shoulder overhead motions secondary to rotator cuff pathology and pain.  Therapist monitoring response throughout treatment session.    Comorbidities history of CA base of tongue with radiation/chemo; COPD; HTN; lumbar fusion 2004;  left rotator cuff tear    Examination-Participation Restrictions Cleaning;Driving;Yard Work;Shop;Community Activity    Stability/Clinical Decision Making Evolving/Moderate complexity    Rehab Potential Good    PT Frequency 2x / week    PT Duration 12 weeks    PT Treatment/Interventions ADLs/Self Care Home Management;Aquatic Therapy;Cryotherapy;Electrical Stimulation;Ultrasound;Moist Heat;Therapeutic activities;Therapeutic exercise;Neuromuscular re-education;Manual techniques;Patient/family education;Dry needling;Taping    PT Next Visit Plan DN#2 to upper traps, cervical multifidi, suboccipitals; may benefit from right DN to SCM and scalenes if tissue is compliant enough to safely approximate; propped on wedge for ex; quadruped head lifts; seated head pushes against ball or with yellow band;  postural strength, cervical mobility, manual    PT Home Exercise Plan  [email or web address]             Patient will benefit from skilled therapeutic intervention in order to improve the following deficits and impairments:  Pain, Decreased range of motion, Increased fascial restricitons, Decreased strength, Postural dysfunction, Impaired perceived functional ability, Decreased activity tolerance, Increased muscle spasms, Impaired UE functional use  Visit Diagnosis: Cervicalgia  Muscle weakness (generalized)  Other abnormalities of gait and mobility  Abnormal posture     Problem List Patient Active Problem List   Diagnosis Date Noted   HNP (herniated nucleus pulposus) with myelopathy, cervical 05/27/2020   Positive FIT (fecal immunochemical test)    Polyp of ascending colon    Adenomatous polyp of sigmoid colon    Diverticulosis of colon without hemorrhage    Grade II internal hemorrhoids    Malignant neoplasm of base of tongue ([facility]) 10/27/2014   Chronic kidney disease    COPD (chronic obstructive pulmonary disease) ([place]) 07/10/2012   Chest pain 07/10/2012   HTN (hypertension) 07/10/2012   Hyponatremia 07/10/2012   TOBACCO USE 02/07/2010   Ruben Im, PT 10/06/20 4:33 PM Phone: [phone] Fax: [phone]  Alvera Singh 10/06/2020, 4:32 PM  [facility] [address], [place], [place], [address] Phone: [phone]   Fax:  [phone]  Name: AKIEM URIETA MRN: [id number] Date of Birth: 02/20/1956

## 2020-10-06 NOTE — Patient Instructions (Signed)
Access Code: [email or web address] URL: [email or web address] Date: 10/06/2020 Prepared by: Ruben Im  Exercises Supine Cervical Retraction with Towel - 5 x daily - 7 x weekly - 1 sets - 5-8 reps - 5 hold Supine Scapular Retraction - 5 x daily - 7 x weekly - 1 sets - 5-8 reps - 5 hold Supine Shoulder Press - 5 x daily - 7 x weekly - 1 sets - 5 reps - 5 hold Supine Cervical Rotation AROM on Pillow - 5 x daily - 7 x weekly - 2 sets - 10 reps Supine Isometric Neck Sidebend (Lateral Flexion as an alternative name) - 5 x daily - 7 x weekly - 2 sets - 5 reps - 5 hold Supine Isometric Neck Rotation - 5 x daily - 7 x weekly - 2 sets - 5 reps - 5 hold Cervical Retraction with Resistance - 5 x daily - 7 x weekly - 1 sets - 5-10 reps - 5 hold

## 2020-10-11 ENCOUNTER — Encounter: Payer: Self-pay | Admitting: Physical Therapy

## 2020-10-11 ENCOUNTER — Other Ambulatory Visit: Payer: Self-pay

## 2020-10-11 ENCOUNTER — Ambulatory Visit: Payer: No Typology Code available for payment source | Admitting: Physical Therapy

## 2020-10-11 DIAGNOSIS — R252 Cramp and spasm: Secondary | ICD-10-CM

## 2020-10-11 DIAGNOSIS — M542 Cervicalgia: Secondary | ICD-10-CM

## 2020-10-11 DIAGNOSIS — M6281 Muscle weakness (generalized): Secondary | ICD-10-CM

## 2020-10-11 NOTE — Therapy (Signed)
[facility] [address] [place], [place], [place], [address] Phone: [phone]   Fax:  [phone]  Physical Therapy Treatment  Patient Details  Name: George Nielsen MRN: [id number] Date of Birth: 12/04/1955 Referring Provider (PT): Dr. Christella Noa   Encounter Date: 10/11/2020   PT End of Session - 10/11/20 1021     Visit Number 8    Number of Visits 15    Date for PT Re-Evaluation 12/06/20    Authorization Type 15 [facility]    Authorization - Visit Number 8    Authorization - Number of Visits 15    PT Start Time 1013    PT Stop Time 1058    PT Time Calculation (min) 45 min    Activity Tolerance No increased pain    Behavior During Therapy WFL for tasks assessed/performed             Past Medical History:  Diagnosis Date   Allergy    Anemia    Anxiety    Arthritis    Cancer ([place])    tongue and neck lymp nodes   COPD (chronic obstructive pulmonary disease) ([place])    Dyspnea    climbing stairs, walking distance   Emphysema of lung ([place])    GERD (gastroesophageal reflux disease)    Hepatitis C    treated   History of radiation therapy 11/10/14- 12/29/14   BOT and bilateral neck/ 70 Gy in 35 fractions to gross disease, 63 Gy in 35 fractions to high risk nodal echelons, and 56 Gy in 35 fraction to intermediate risk nodal echelons.    Hypertension    Hypothyroidism    PNA (pneumonia)    4 times   Sleep apnea    wears CPAP sometimes per pt.   05/25/19 - not able to use at this time due to neck pain.   Thyroid disease    take Synthroid d/t scar tissue from radiation around thyroid   Ulcer     Past Surgical History:  Procedure Laterality Date   ANTERIOR CERVICAL DECOMP/DISCECTOMY FUSION N/A 05/27/2020   Procedure: Cervical Three-Four Anterior cervical decompression/discectomy/fusion;  Surgeon: Ashok Pall, MD;  Location: [employer];  Service: Neurosurgery;  Laterality: N/A;  anterior   BACK SURGERY      COLONOSCOPY WITH PROPOFOL N/A 01/06/2020   Procedure: COLONOSCOPY WITH PROPOFOL;  Surgeon: Lavena Bullion, DO;  [facility];  Service: Gastroenterology;  Laterality: N/A;   ELBOW SURGERY Left    HEMOSTASIS CLIP PLACEMENT  01/06/2020   Procedure: HEMOSTASIS CLIP PLACEMENT;  Surgeon: Lavena Bullion, DO;  [facility];  Service: Gastroenterology;;   POLYPECTOMY  01/06/2020   Procedure: POLYPECTOMY;  Surgeon: Lavena Bullion, DO;  [facility];  Service: Gastroenterology;;   SHOULDER SURGERY     TONSILLECTOMY     tracheotomy     WRIST SURGERY     right    There were no vitals filed for this visit.   Subjective Assessment - 10/11/20 1015     Subjective Pt reports some mild soreness in posterior neck and shoulders but reports DN helped with pain overall. Pt reports he continues to be complaint with HEP.    Pertinent History lumbar fusion 2004   March 4th 2022   Radiation damage to throat--finished CA treatment (had chemo too) 5 years ago; left rotator cuff tear (plans to have surgery)    Limitations Sitting    How long can you sit comfortably? a  good while    How long can you walk comfortably? My head hangs down    Diagnostic tests MRI    Patient Stated Goals more movement; less pain; hold head up    Currently in Pain? Yes    Pain Score 2     Pain Location Neck    Pain Orientation Posterior    Pain Descriptors / Indicators Sore    Pain Type Surgical pain    Pain Onset More than a month ago    Pain Frequency Constant                               [facility] Adult PT Treatment/Exercise - 10/11/20 0001       Exercises   Exercises Neck;Shoulder;Lumbar      Neck Exercises: Machines for Strengthening   UBE (Upper Arm Bike) L1.5  mins forward/ backward   VC for looking up throughout     Neck Exercises: Seated   Other Seated Exercise cervical retraction with red band x10, TC for posture and head carriage    Other Seated  Exercise thoracic opening x10 each way, trunk extension with overhead reach and cues to look upright for posture x10.      Neck Exercises: Supine   Cervical Rotation Both;10 reps   2 pillow prop   Lateral Flexion Both;10 reps    Other Supine Exercise cervical retraction 10x, supine midline positioning with one pillow slightly rolled under head for cervical extension to neutral 1 min    Other Supine Exercise 2 pillow prop, SPC used for front raises bil x10 each, shoulder diagonals x10 each green band      Neck Exercises: Prone   Other Prone Exercise quadruped head lift to neutral 2x 5    Other Prone Exercise quadruped scapular protract/retract 5x      Shoulder Exercises: Seated   Row Both;10 reps;Strengthening;Theraband   VC for improved head carriage and decreased cervical flexion during activity   Theraband Level (Shoulder Row) Level 3 (Green)    Horizontal ABduction Strengthening;Both;10 reps   VC and tactile cues for posture and technique   Theraband Level (Shoulder Horizontal ABduction) Level 3 (Green)   tactile cues for technique and noted compensatory strategies attempted to achieve excerise but improved with cues                   PT Education - 10/11/20 1020     Education Details pt educated on overall correct technique with all exercises and posture to head carriage at midline    Person(s) Educated Patient    Methods Explanation;Demonstration;Tactile cues;Verbal cues    Comprehension Verbalized understanding;Returned demonstration              PT Short Term Goals - 09/20/20 1107       PT SHORT TERM GOAL #1   Title The patient will demonstrate basic HEP to promote ROM and mobility of neck    Status On-going      PT SHORT TERM GOAL #2   Title The patient will be able to hold head in neutral for 1 minute for light household and yardwork    Status On-going      PT SHORT TERM GOAL #3   Title The patient will have improved cervical rotation to 40 degrees  needed for driving      PT SHORT TERM GOAL #4   Title The patient will report a 30% improvement  in neck pain with usual ADLs    Status On-going               PT Long Term Goals - 09/13/20 1756       PT LONG TERM GOAL #1   Title The patient will be independent with safe self progression of HEP    Time 12    Period Weeks    Status New    Target Date 12/06/20      PT LONG TERM GOAL #2   Title The patient will be able to hold head in neutral for 2 minutes for household chores and yardwork (trimming limbs)    Time 12    Period Weeks    Status New      PT LONG TERM GOAL #3   Title The patient will have improved cervical extension to 15 degrees and bil sidebending to 40 degrees needed for yardwork    Time 12    Period Weeks    Status New      PT LONG TERM GOAL #4   Title The patient will report a 60% improvement in pain and performance of ADLs    Time 12    Period Weeks    Status New      PT LONG TERM GOAL #5   Title FOTO functional outcome score improved to 61%    Time 12    Period Weeks    Status New                   Plan - 10/11/20 1024     Clinical Impression Statement Pt presenting to clinic with reported improvement with tightness in posterior neck post dry needling last session. Pt continues to demonstrated trunk flexion and poor posture with all activity and difficulty achieving neutral at neck. Pt continues to have muscle tightness in all directions except flexion but has shown improvement since starting therapy and pt reports he feels is able to hold head up for often for longer periods of time. Pt session focused on cervical stretching and mobility, posture based exercises to promote strengthening for trunk extensors and improved shoulder and neck mobility.    Personal Factors and Comorbidities Comorbidity 1;Comorbidity 2;Comorbidity 3+    Comorbidities history of CA base of tongue with radiation/chemo; COPD; HTN; lumbar fusion 2004;  left rotator  cuff tear    Examination-Activity Limitations Reach Overhead;Carry;Lift;Dressing;Other    Examination-Participation Restrictions Cleaning;Driving;Yard Work;Shop;Community Activity    Stability/Clinical Decision Making Evolving/Moderate complexity    Clinical Decision Making Moderate    Rehab Potential Good    PT Frequency 2x / week    PT Duration 12 weeks    PT Treatment/Interventions ADLs/Self Care Home Management;Aquatic Therapy;Cryotherapy;Electrical Stimulation;Ultrasound;Moist Heat;Therapeutic activities;Therapeutic exercise;Neuromuscular re-education;Manual techniques;Patient/family education;Dry needling;Taping    PT Next Visit Plan DN#2 to upper traps, cervical multifidi, suboccipitals; may benefit from right DN to SCM and scalenes if tissue is compliant enough to safely approximate    PT Home Exercise Plan [email or web address]    Consulted and Agree with Plan of Care Patient             Patient will benefit from skilled therapeutic intervention in order to improve the following deficits and impairments:  Pain, Decreased range of motion, Increased fascial restricitons, Decreased strength, Postural dysfunction, Impaired perceived functional ability, Decreased activity tolerance, Increased muscle spasms, Impaired UE functional use  Visit Diagnosis: Cervicalgia  Cramp and spasm  Muscle weakness (generalized)     Problem List Patient Active  Problem List   Diagnosis Date Noted   HNP (herniated nucleus pulposus) with myelopathy, cervical 05/27/2020   Positive FIT (fecal immunochemical test)    Polyp of ascending colon    Adenomatous polyp of sigmoid colon    Diverticulosis of colon without hemorrhage    Grade II internal hemorrhoids    Malignant neoplasm of base of tongue ([place]) 10/27/2014   Chronic kidney disease    COPD (chronic obstructive pulmonary disease) ([place]) 07/10/2012   Chest pain 07/10/2012   HTN (hypertension) 07/10/2012   Hyponatremia 07/10/2012   TOBACCO USE  02/07/2010    Stacy Gardner, PT 10/11/2209:00 AM   [facility] [address] [place], [place], [address] Phone: [phone]   Fax:  [phone]  Name: George Nielsen MRN: [id number] Date of Birth: Mar 06, 1956

## 2020-10-13 ENCOUNTER — Encounter: Payer: Self-pay | Admitting: Physical Therapy

## 2020-10-13 ENCOUNTER — Other Ambulatory Visit: Payer: Self-pay

## 2020-10-13 ENCOUNTER — Ambulatory Visit: Payer: No Typology Code available for payment source | Admitting: Physical Therapy

## 2020-10-13 DIAGNOSIS — R293 Abnormal posture: Secondary | ICD-10-CM

## 2020-10-13 DIAGNOSIS — M6281 Muscle weakness (generalized): Secondary | ICD-10-CM

## 2020-10-13 DIAGNOSIS — M542 Cervicalgia: Secondary | ICD-10-CM | POA: Diagnosis not present

## 2020-10-13 DIAGNOSIS — R252 Cramp and spasm: Secondary | ICD-10-CM

## 2020-10-13 NOTE — Therapy (Signed)
[facility] [address] [place], [place], [address] Phone: [phone]   Fax:  [phone]  Physical Therapy Treatment  Patient Details  Name: George Nielsen MRN: [id number] Date of Birth: 12-07-1955 Referring Provider (PT): Dr. Christella Noa   Encounter Date: 10/13/2020   PT End of Session - 10/13/20 1019     Visit Number 9    Number of Visits 15    Date for PT Re-Evaluation 12/06/20    Authorization Type 15 [facility]    Authorization - Visit Number 9    Authorization - Number of Visits 15    PT Start Time 1962    PT Stop Time 1055    PT Time Calculation (min) 40 min    Activity Tolerance No increased pain;Patient tolerated treatment well    Behavior During Therapy [facility] for tasks assessed/performed             Past Medical History:  Diagnosis Date   Allergy    Anemia    Anxiety    Arthritis    Cancer ([place])    tongue and neck lymp nodes   COPD (chronic obstructive pulmonary disease) (HCC)    Dyspnea    climbing stairs, walking distance   Emphysema of lung (HCC)    GERD (gastroesophageal reflux disease)    Hepatitis C    treated   History of radiation therapy 11/10/14- 12/29/14   BOT and bilateral neck/ 70 Gy in 35 fractions to gross disease, 63 Gy in 35 fractions to high risk nodal echelons, and 56 Gy in 35 fraction to intermediate risk nodal echelons.    Hypertension    Hypothyroidism    PNA (pneumonia)    4 times   Sleep apnea    wears CPAP sometimes per pt.   05/25/19 - not able to use at this time due to neck pain.   Thyroid disease    take Synthroid d/t scar tissue from radiation around thyroid   Ulcer     Past Surgical History:  Procedure Laterality Date   ANTERIOR CERVICAL DECOMP/DISCECTOMY FUSION N/A 05/27/2020   Procedure: Cervical Three-Four Anterior cervical decompression/discectomy/fusion;  Surgeon: Ashok Pall, MD;  Location: [place];  Service: Neurosurgery;  Laterality:  N/A;  anterior   BACK SURGERY     COLONOSCOPY WITH PROPOFOL N/A 01/06/2020   Procedure: COLONOSCOPY WITH PROPOFOL;  Surgeon: Lavena Bullion, DO;  Location: [facility];  Service: Gastroenterology;  Laterality: N/A;   ELBOW SURGERY Left    HEMOSTASIS CLIP PLACEMENT  01/06/2020   Procedure: HEMOSTASIS CLIP PLACEMENT;  Surgeon: Lavena Bullion, DO;  Location: [facility];  Service: Gastroenterology;;   POLYPECTOMY  01/06/2020   Procedure: POLYPECTOMY;  Surgeon: Lavena Bullion, DO;  Location: [facility];  Service: Gastroenterology;;   SHOULDER SURGERY     TONSILLECTOMY     tracheotomy     WRIST SURGERY     right    There were no vitals filed for this visit.   Subjective Assessment - 10/13/20 1016     Subjective Pt reports some soreness in posterior neck and shoulders but reports he continues to complete HEP and thinks thats helping.    Pertinent History lumbar fusion 2004   March 4th 2022   Radiation damage to throat--finished CA treatment (had chemo too) 5 years ago; left rotator cuff tear (plans to have surgery)    Limitations Sitting    How long can you sit comfortably? a good while  How long can you walk comfortably? My head hangs down    Diagnostic tests MRI    Patient Stated Goals more movement; less pain; hold head up    Currently in Pain? Yes    Pain Score 2     Pain Location Neck    Pain Orientation Posterior    Pain Descriptors / Indicators Sore    Pain Type Surgical pain    Pain Onset More than a month ago    Pain Frequency Constant                               [facility] Adult PT Treatment/Exercise - 10/13/20 0001       Neck Exercises: Machines for Strengthening   UBE (Upper Arm Bike) L1.5  3 mins forward/ backward   VC for looking up throughout     Neck Exercises: Theraband   Rows 10 reps;Red   seated   Rows Limitations tactile cues for cervical alignment    Other Theraband Exercises seated protraction and retractions with 2#       Neck Exercises: Seated   Cervical Isometrics Right lateral flexion;Left lateral flexion;Left rotation;Right rotation;Other (comment);5 reps;10 secs   cues for posture and technique. noted that pt has more tightness when rotating to Rt compared to Lt   Neck Retraction 10 reps    Shoulder Shrugs 10 reps   2# handweights   Shoulder Rolls 20 reps   2x10 forward and 2x10 backward   Shoulder Flexion Both;10 reps;Limitations    Shoulder Flexion Limitations red band    Other Seated Exercise cervical retraction with red band x10, TC for posture and head carriage, seated flyers with 2# x10    Other Seated Exercise thoracic opening x5 10s holds each way with hands behind head, trunk extension with overhead reach and cues to look upright for posture x10. x10 punches 2#      Neck Exercises: Prone   Other Prone Exercise quadruped head lift to neutral 2x 5                    PT Education - 10/13/20 1018     Education Details Pt educated on technique with all activities, HEP, and techniques to improve posture throughout the day    Person(s) Educated Patient    Methods Explanation;Demonstration;Tactile cues;Verbal cues    Comprehension Verbalized understanding;Returned demonstration              PT Short Term Goals - 09/20/20 1107       PT SHORT TERM GOAL #1   Title The patient will demonstrate basic HEP to promote ROM and mobility of neck    Status On-going      PT SHORT TERM GOAL #2   Title The patient will be able to hold head in neutral for 1 minute for light household and yardwork    Status On-going      PT SHORT TERM GOAL #3   Title The patient will have improved cervical rotation to 40 degrees needed for driving      PT SHORT TERM GOAL #4   Title The patient will report a 30% improvement in neck pain with usual ADLs    Status On-going               PT Long Term Goals - 09/13/20 1756       PT LONG TERM GOAL #1   Title The patient  will be independent with  safe self progression of HEP    Time 12    Period Weeks    Status New    Target Date 12/06/20      PT LONG TERM GOAL #2   Title The patient will be able to hold head in neutral for 2 minutes for household chores and yardwork (trimming limbs)    Time 12    Period Weeks    Status New      PT LONG TERM GOAL #3   Title The patient will have improved cervical extension to 15 degrees and bil sidebending to 40 degrees needed for yardwork    Time 12    Period Weeks    Status New      PT LONG TERM GOAL #4   Title The patient will report a 60% improvement in pain and performance of ADLs    Time 12    Period Weeks    Status New      PT LONG TERM GOAL #5   Title FOTO functional outcome score improved to 61%    Time 12    Period Weeks    Status New                   Plan - 10/13/20 1019     Personal Factors and Comorbidities Comorbidity 1;Comorbidity 2;Comorbidity 3+    Comorbidities history of CA base of tongue with radiation/chemo; COPD; HTN; lumbar fusion 2004;  left rotator cuff tear    Examination-Activity Limitations Reach Overhead;Carry;Lift;Dressing;Other    Examination-Participation Restrictions Cleaning;Driving;Yard Work;Shop;Community Activity    Stability/Clinical Decision Making Evolving/Moderate complexity    Clinical Decision Making Moderate    Rehab Potential Good    PT Frequency 2x / week    PT Duration 12 weeks    PT Treatment/Interventions ADLs/Self Care Home Management;Aquatic Therapy;Cryotherapy;Electrical Stimulation;Ultrasound;Moist Heat;Therapeutic activities;Therapeutic exercise;Neuromuscular re-education;Manual techniques;Patient/family education;Dry needling;Taping    PT Next Visit Plan DN#2 to upper traps, cervical multifidi, suboccipitals; may benefit from right DN to SCM and scalenes if tissue is compliant enough to safely approximate.    PT Home Exercise Plan [id number]    Consulted and Agree with Plan of Care Patient             Patient  will benefit from skilled therapeutic intervention in order to improve the following deficits and impairments:  Pain, Decreased range of motion, Increased fascial restricitons, Decreased strength, Postural dysfunction, Impaired perceived functional ability, Decreased activity tolerance, Increased muscle spasms, Impaired UE functional use  Visit Diagnosis: Abnormal posture  Cramp and spasm  Muscle weakness (generalized)     Problem List Patient Active Problem List   Diagnosis Date Noted   HNP (herniated nucleus pulposus) with myelopathy, cervical 05/27/2020   Positive FIT (fecal immunochemical test)    Polyp of ascending colon    Adenomatous polyp of sigmoid colon    Diverticulosis of colon without hemorrhage    Grade II internal hemorrhoids    Malignant neoplasm of base of tongue ([place]) 10/27/2014   Chronic kidney disease    COPD (chronic obstructive pulmonary disease) ([place]) 07/10/2012   Chest pain 07/10/2012   HTN (hypertension) 07/10/2012   Hyponatremia 07/10/2012   TOBACCO USE 02/07/2010    Stacy Gardner, PT 10/13/2208:56 AM   [facility] [address] [place], [place], [address] Phone: [phone]   Fax:  [phone]  Name: George Nielsen MRN: [id number] Date of Birth: [birth year]

## 2020-10-18 ENCOUNTER — Other Ambulatory Visit: Payer: Self-pay

## 2020-10-18 ENCOUNTER — Ambulatory Visit: Payer: No Typology Code available for payment source | Admitting: Physical Therapy

## 2020-10-18 DIAGNOSIS — M6281 Muscle weakness (generalized): Secondary | ICD-10-CM

## 2020-10-18 DIAGNOSIS — R293 Abnormal posture: Secondary | ICD-10-CM

## 2020-10-18 DIAGNOSIS — R252 Cramp and spasm: Secondary | ICD-10-CM

## 2020-10-18 DIAGNOSIS — M542 Cervicalgia: Secondary | ICD-10-CM | POA: Diagnosis not present

## 2020-10-18 NOTE — Therapy (Signed)
[facility] [address] [place], [place], [address] Phone: [phone]   Fax:  [phone]  Physical Therapy Treatment  Patient Details  Name: George Nielsen MRN: [id number] Date of Birth: 1956-02-23 Referring Provider (PT): Dr. Christella Noa  Progress Note Reporting Period 09/13/20 to 10/18/20  See note below for Objective Data and Assessment of Progress/Goals.      Encounter Date: 10/18/2020   PT End of Session - 10/18/20 1057     Visit Number 10    Number of Visits 15    Date for PT Re-Evaluation 12/06/20    Authorization Type [facility]    Authorization - Visit Number 10    Authorization - Number of Visits 15    PT Start Time [id number]    PT Stop Time 1054    PT Time Calculation (min) 39 min    Activity Tolerance Patient tolerated treatment well             Past Medical History:  Diagnosis Date   Allergy    Anemia    Anxiety    Arthritis    Cancer ([place])    tongue and neck lymp nodes   COPD (chronic obstructive pulmonary disease) (HCC)    Dyspnea    climbing stairs, walking distance   Emphysema of lung (HCC)    GERD (gastroesophageal reflux disease)    Hepatitis C    treated   History of radiation therapy 11/10/14- 12/29/14   BOT and bilateral neck/ 70 Gy in 35 fractions to gross disease, 63 Gy in 35 fractions to high risk nodal echelons, and 56 Gy in 35 fraction to intermediate risk nodal echelons.    Hypertension    Hypothyroidism    PNA (pneumonia)    4 times   Sleep apnea    wears CPAP sometimes per pt.   05/25/19 - not able to use at this time due to neck pain.   Thyroid disease    take Synthroid d/t scar tissue from radiation around thyroid   Ulcer     Past Surgical History:  Procedure Laterality Date   ANTERIOR CERVICAL DECOMP/DISCECTOMY FUSION N/A 05/27/2020   Procedure: Cervical Three-Four Anterior cervical decompression/discectomy/fusion;  Surgeon: Ashok Pall, MD;   Location: [place];  Service: Neurosurgery;  Laterality: N/A;  anterior   BACK SURGERY     COLONOSCOPY WITH PROPOFOL N/A 01/06/2020   Procedure: COLONOSCOPY WITH PROPOFOL;  Surgeon: Lavena Bullion, DO;  [facility];  Service: Gastroenterology;  Laterality: N/A;   ELBOW SURGERY Left    HEMOSTASIS CLIP PLACEMENT  01/06/2020   Procedure: HEMOSTASIS CLIP PLACEMENT;  Surgeon: Lavena Bullion, DO;  [facility];  Service: Gastroenterology;;   POLYPECTOMY  01/06/2020   Procedure: POLYPECTOMY;  Surgeon: Lavena Bullion, DO;  Location: [facility];  Service: Gastroenterology;;   SHOULDER SURGERY     TONSILLECTOMY     tracheotomy     WRIST SURGERY     right    There were no vitals filed for this visit.   Subjective Assessment - 10/18/20 1017     Subjective More or less discomfort rather than pain.    Pertinent History lumbar fusion 2004   March 4th 2022   Radiation damage to throat--finished CA treatment (had chemo too) 5 years ago; left rotator cuff tear (plans to have surgery)    Currently in Pain? Yes    Pain Score 2     Pain Location Neck  [facility] PT Assessment - 10/18/20 0001       Observation/Other Assessments   Focus on Therapeutic Outcomes (FOTO)  47%      Posture/Postural Control   Posture Comments able to hold head in neutral 1:30      AROM   Cervical Extension 30    Cervical - Right Side Bend 36    Cervical - Left Side Bend 28    Cervical - Right Rotation 30    Cervical - Left Rotation 35                           [facility] Adult PT Treatment/Exercise - 10/18/20 0001       Neck Exercises: Seated   Other Seated Exercise thoracic extension over blue with inhale 5x    Other Seated Exercise cervical retraction against ball on wall isometric 3x 5 sec hold;  cervical rotation isometric hold 3x 5 sec each way      Neck Exercises: Prone   Other Prone Exercise quadruped head lift to neutral 2x 5 with UE lifts       Manual Therapy   Soft tissue mobilization bil cervical paraspinals, upper traps and suboccipitals              Trigger Point Dry Needling - 10/18/20 0001     Consent Given? Yes    Education Handout Provided Yes    Muscles Treated Head and Neck Upper trapezius;Suboccipitals;Cervical multifidi    Other Dry Needling bil    Upper Trapezius Response Twitch reponse elicited    Suboccipitals Response Palpable increased muscle length    Cervical multifidi Response Palpable increased muscle length                    PT Short Term Goals - 10/18/20 1518       PT SHORT TERM GOAL #1   Title The patient will demonstrate basic HEP to promote ROM and mobility of neck    Status Achieved      PT SHORT TERM GOAL #2   Title The patient will be able to hold head in neutral for 1 minute for light household and yardwork    Status Achieved      PT SHORT TERM GOAL #3   Title The patient will have improved cervical rotation to 40 degrees needed for driving    Time 6    Period Weeks    Status On-going      PT SHORT TERM GOAL #4   Title The patient will report a 30% improvement in neck pain with usual ADLs    Time 6    Period Weeks    Status On-going               PT Long Term Goals - 09/13/20 1756       PT LONG TERM GOAL #1   Title The patient will be independent with safe self progression of HEP    Time 12    Period Weeks    Status New    Target Date 12/06/20      PT LONG TERM GOAL #2   Title The patient will be able to hold head in neutral for 2 minutes for household chores and yardwork (trimming limbs)    Time 12    Period Weeks    Status New      PT LONG TERM GOAL #3   Title The patient will have improved cervical  extension to 15 degrees and bil sidebending to 40 degrees needed for yardwork    Time 12    Period Weeks    Status New      PT LONG TERM GOAL #4   Title The patient will report a 60% improvement in pain and performance of ADLs    Time 12     Period Weeks    Status New      PT LONG TERM GOAL #5   Title FOTO functional outcome score improved to 61%    Time 12    Period Weeks    Status New                   Plan - 10/18/20 1057     Clinical Impression Statement Although the patient has a lower FOTO score, he reports some improvement in function and discomfort level.  His extension ROM has improved from 0 degrees to 30 degrees.  He is now able to hold his head in neutral for 1 1/2 minutes (only 5 sec at initial evaluation).  He reports a positive response to dry needling (2nd time today) with improved soft tissue mobility noted.  Encouraged regular practice of HEP including stretching and holding head in neutral for timed increments.  Mod verbal cues continue to be needed for upright posture particularly in sitting.    Comorbidities history of CA base of tongue with radiation/chemo; COPD; HTN; lumbar fusion 2004;  left rotator cuff tear    Examination-Participation Restrictions Cleaning;Driving;Yard Work;Shop;Community Activity    Rehab Potential Good    PT Frequency 2x / week    PT Duration 12 weeks    PT Treatment/Interventions ADLs/Self Care Home Management;Aquatic Therapy;Cryotherapy;Electrical Stimulation;Ultrasound;Moist Heat;Therapeutic activities;Therapeutic exercise;Neuromuscular re-education;Manual techniques;Patient/family education;Dry needling;Taping    PT Next Visit Plan check % improvement next visit;  assess response to DN#2 to upper traps, cervical multifidi, suboccipitals; may benefit from right DN to SCM and scalenes if tissue is compliant enough to safely approximate.    PT Home Exercise Plan [id number]             Patient will benefit from skilled therapeutic intervention in order to improve the following deficits and impairments:  Pain, Decreased range of motion, Increased fascial restricitons, Decreased strength, Postural dysfunction, Impaired perceived functional ability, Decreased activity  tolerance, Increased muscle spasms, Impaired UE functional use  Visit Diagnosis: Abnormal posture  Muscle weakness (generalized)  Cramp and spasm  Cervicalgia     Problem List Patient Active Problem List   Diagnosis Date Noted   HNP (herniated nucleus pulposus) with myelopathy, cervical 05/27/2020   Positive FIT (fecal immunochemical test)    Polyp of ascending colon    Adenomatous polyp of sigmoid colon    Diverticulosis of colon without hemorrhage    Grade II internal hemorrhoids    Malignant neoplasm of base of tongue ([place]) 10/27/2014   Chronic kidney disease    COPD (chronic obstructive pulmonary disease) ([place]) 07/10/2012   Chest pain 07/10/2012   HTN (hypertension) 07/10/2012   Hyponatremia 07/10/2012   TOBACCO USE 02/07/2010   Ruben Im, PT 10/18/20 3:21 PM Phone: [phone] Fax: [phone]  Alvera Singh 10/18/2020, 3:20 PM  [facility] [address] [place], [place], [address] Phone: [phone]   Fax:  [phone]  Name: HOSEA FULK MRN: [id number] Date of Birth: 1955/06/19

## 2020-10-20 ENCOUNTER — Other Ambulatory Visit: Payer: Self-pay

## 2020-10-20 ENCOUNTER — Ambulatory Visit: Payer: No Typology Code available for payment source | Admitting: Physical Therapy

## 2020-10-20 DIAGNOSIS — M542 Cervicalgia: Secondary | ICD-10-CM

## 2020-10-20 DIAGNOSIS — M6281 Muscle weakness (generalized): Secondary | ICD-10-CM

## 2020-10-20 DIAGNOSIS — R293 Abnormal posture: Secondary | ICD-10-CM

## 2020-10-20 DIAGNOSIS — R252 Cramp and spasm: Secondary | ICD-10-CM

## 2020-10-20 NOTE — Therapy (Signed)
[facility] [address] [place], [place], [place], [address] Phone: [phone]   Fax:  [phone]  Physical Therapy Treatment  Patient Details  Name: George Nielsen MRN: [id number] Date of Birth: 05-Feb-1956 Referring Provider (PT): Dr. Christella Noa   Encounter Date: 10/20/2020   PT End of Session - 10/20/20 1104     Visit Number 11    Number of Visits 15    Date for PT Re-Evaluation 12/06/20    Authorization Type 15 [facility]    Authorization - Visit Number 10    Authorization - Number of Visits 15    PT Start Time [id number]    PT Stop Time 1057    PT Time Calculation (min) 42 min    Activity Tolerance Patient tolerated treatment well             Past Medical History:  Diagnosis Date   Allergy    Anemia    Anxiety    Arthritis    Cancer ([place])    tongue and neck lymp nodes   COPD (chronic obstructive pulmonary disease) ([place])    Dyspnea    climbing stairs, walking distance   Emphysema of lung ([place])    GERD (gastroesophageal reflux disease)    Hepatitis C    treated   History of radiation therapy 11/10/14- 12/29/14   BOT and bilateral neck/ 70 Gy in 35 fractions to gross disease, 63 Gy in 35 fractions to high risk nodal echelons, and 56 Gy in 35 fraction to intermediate risk nodal echelons.    Hypertension    Hypothyroidism    PNA (pneumonia)    4 times   Sleep apnea    wears CPAP sometimes per pt.   05/25/19 - not able to use at this time due to neck pain.   Thyroid disease    take Synthroid d/t scar tissue from radiation around thyroid   Ulcer     Past Surgical History:  Procedure Laterality Date   ANTERIOR CERVICAL DECOMP/DISCECTOMY FUSION N/A 05/27/2020   Procedure: Cervical Three-Four Anterior cervical decompression/discectomy/fusion;  Surgeon: Ashok Pall, MD;  Location: [facility];  Service: Neurosurgery;  Laterality: N/A;  anterior   BACK SURGERY     COLONOSCOPY WITH PROPOFOL N/A 01/06/2020    Procedure: COLONOSCOPY WITH PROPOFOL;  Surgeon: Lavena Bullion, DO;  Location[facility];  Service: Gastroenterology;  Laterality: N/A;   ELBOW SURGERY Left    HEMOSTASIS CLIP PLACEMENT  01/06/2020   Procedure: HEMOSTASIS CLIP PLACEMENT;  Surgeon: Lavena Bullion, DO;  Location[facility];  Service: Gastroenterology;;   POLYPECTOMY  01/06/2020   Procedure: POLYPECTOMY;  Surgeon: Lavena Bullion, DO;  [facility];  Service: Gastroenterology;;   SHOULDER SURGERY     TONSILLECTOMY     tracheotomy     WRIST SURGERY     right    There were no vitals filed for this visit.   Subjective Assessment - 10/20/20 1020     Subjective That right shoulder stays tight.  Not really sore from DN.  Rates his overall progress at "5%"    Pertinent History lumbar fusion 2004   March 4th 2022   Radiation damage to throat--finished CA treatment (had chemo too) 5 years ago; left rotator cuff tear (plans to have surgery)    Patient Stated Goals more movement; less pain; hold head up    Currently in Pain? Yes    Pain Score 2     Pain Location  Neck    Pain Type Chronic pain                               [facility] Adult PT Treatment/Exercise - 10/20/20 0001       Neck Exercises: Standing   Neck Retraction Limitations back to wall with mirror feedback to hold head in midline 7 attempts    Wall Wash facing wall with ball on forehead with UE slides 8x    Other Standing Exercises red band bil shoulder extensions 10x; row red band 10x    Other Standing Exercises red band row and rotate 10x right/left      Neck Exercises: Seated   Other Seated Exercise thoracic extension with inhale against towel roll 10x    Other Seated Exercise yellow band self resisted neck extension/retracted  10x      Neck Exercises: Supine   Other Supine Exercise propped on wedge:cervical rotation 8x; cervical retraction 10x    Other Supine Exercise propped on wedge with scap retract and elbow  press down 10x; 4# golf club press 15x      Neck Exercises: Prone   Other Prone Exercise quadruped head lift to neutral 2x 5   2nd set with hold                     PT Short Term Goals - 10/18/20 1518       PT SHORT TERM GOAL #1   Title The patient will demonstrate basic HEP to promote ROM and mobility of neck    Status Achieved      PT SHORT TERM GOAL #2   Title The patient will be able to hold head in neutral for 1 minute for light household and yardwork    Status Achieved      PT SHORT TERM GOAL #3   Title The patient will have improved cervical rotation to 40 degrees needed for driving    Time 6    Period Weeks    Status On-going      PT SHORT TERM GOAL #4   Title The patient will report a 30% improvement in neck pain with usual ADLs    Time 6    Period Weeks    Status On-going               PT Long Term Goals - 09/13/20 1756       PT LONG TERM GOAL #1   Title The patient will be independent with safe self progression of HEP    Time 12    Period Weeks    Status New    Target Date 12/06/20      PT LONG TERM GOAL #2   Title The patient will be able to hold head in neutral for 2 minutes for household chores and yardwork (trimming limbs)    Time 12    Period Weeks    Status New      PT LONG TERM GOAL #3   Title The patient will have improved cervical extension to 15 degrees and bil sidebending to 40 degrees needed for yardwork    Time 12    Period Weeks    Status New      PT LONG TERM GOAL #4   Title The patient will report a 60% improvement in pain and performance of ADLs    Time 12    Period Weeks    Status  New      PT LONG TERM GOAL #5   Title FOTO functional outcome score improved to 61%    Time 12    Period Weeks    Status New                   Plan - 10/20/20 1105     Clinical Impression Statement The patient rates his overall progress with PT quite low at 5%.  He is able to hold his head erect for longer periods of  time 1 1/2 minutes although he is unable to sustain it and needs frequent cues.   Cues also needed to hold head in midline (often rests in a side tilt) and the mirror is helpful for feedback to correct.  Encouraged use of bathroom mirror at home for corrective ex.   Modified treatment to limit UE overhead motions secondary to shoulder pain.    Comorbidities history of CA base of tongue with radiation/chemo; COPD; HTN; lumbar fusion 2004;  left rotator cuff tear    Examination-Activity Limitations Reach Overhead;Carry;Lift;Dressing;Other    Rehab Potential Good    PT Frequency 2x / week    PT Duration 12 weeks    PT Treatment/Interventions ADLs/Self Care Home Management;Aquatic Therapy;Cryotherapy;Electrical Stimulation;Ultrasound;Moist Heat;Therapeutic activities;Therapeutic exercise;Neuromuscular re-education;Manual techniques;Patient/family education;Dry needling;Taping    PT Next Visit Plan recheck ROM: DN#3 to upper traps, cervical multifidi, suboccipitals;  mirror feedback for posture/alignment;  cervical extension/retraction;   may benefit from right DN to SCM and scalenes if tissue is compliant enough to safely approximate.    PT Home Exercise Plan [id number]             Patient will benefit from skilled therapeutic intervention in order to improve the following deficits and impairments:  Pain, Decreased range of motion, Increased fascial restricitons, Decreased strength, Postural dysfunction, Impaired perceived functional ability, Decreased activity tolerance, Increased muscle spasms, Impaired UE functional use  Visit Diagnosis: Abnormal posture  Muscle weakness (generalized)  Cramp and spasm  Cervicalgia     Problem List Patient Active Problem List   Diagnosis Date Noted   HNP (herniated nucleus pulposus) with myelopathy, cervical 05/27/2020   Positive FIT (fecal immunochemical test)    Polyp of ascending colon    Adenomatous polyp of sigmoid colon    Diverticulosis of  colon without hemorrhage    Grade II internal hemorrhoids    Malignant neoplasm of base of tongue ([place]) 10/27/2014   Chronic kidney disease    COPD (chronic obstructive pulmonary disease) ([place]) 07/10/2012   Chest pain 07/10/2012   HTN (hypertension) 07/10/2012   Hyponatremia 07/10/2012   TOBACCO USE 02/07/2010   Ruben Im, PT 10/20/20 11:13 AM Phone: [phone] Fax: [phone]  Alvera Singh 10/20/2020, 11:13 AM  [facility] [address] [place], [place], [address] Phone: [phone]   Fax:  [phone]  Name: George Nielsen MRN: [id number] Date of Birth: 07-22-1955

## 2020-10-25 ENCOUNTER — Ambulatory Visit: Payer: No Typology Code available for payment source | Attending: Neurosurgery | Admitting: Physical Therapy

## 2020-10-25 ENCOUNTER — Other Ambulatory Visit: Payer: Self-pay

## 2020-10-25 DIAGNOSIS — M542 Cervicalgia: Secondary | ICD-10-CM | POA: Insufficient documentation

## 2020-10-25 DIAGNOSIS — R293 Abnormal posture: Secondary | ICD-10-CM | POA: Diagnosis present

## 2020-10-25 DIAGNOSIS — R2689 Other abnormalities of gait and mobility: Secondary | ICD-10-CM | POA: Insufficient documentation

## 2020-10-25 DIAGNOSIS — M6281 Muscle weakness (generalized): Secondary | ICD-10-CM | POA: Diagnosis present

## 2020-10-25 DIAGNOSIS — R252 Cramp and spasm: Secondary | ICD-10-CM | POA: Insufficient documentation

## 2020-10-25 NOTE — Therapy (Signed)
[facility] [address], [place], [place], [address] Phone: [phone]   Fax:  [phone]  Physical Therapy Treatment  Patient Details  Name: George Nielsen MRN: [id number] Date of Birth: 1956/01/15 Referring Provider (PT): Dr. Christella Noa   Encounter Date: 10/25/2020   PT End of Session - 10/25/20 1059     Visit Number 12    Number of Visits 15    Date for PT Re-Evaluation 12/06/20    Authorization Type 15 [facility]    Authorization - Visit Number 10    Authorization - Number of Visits 15    PT Start Time [id number]    PT Stop Time 1057    PT Time Calculation (min) 39 min    Activity Tolerance Patient tolerated treatment well             Past Medical History:  Diagnosis Date   Allergy    Anemia    Anxiety    Arthritis    Cancer ([place])    tongue and neck lymp nodes   COPD (chronic obstructive pulmonary disease) ([place])    Dyspnea    climbing stairs, walking distance   Emphysema of lung ([place])    GERD (gastroesophageal reflux disease)    Hepatitis C    treated   History of radiation therapy 11/10/14- 12/29/14   BOT and bilateral neck/ 70 Gy in 35 fractions to gross disease, 63 Gy in 35 fractions to high risk nodal echelons, and 56 Gy in 35 fraction to intermediate risk nodal echelons.    Hypertension    Hypothyroidism    PNA (pneumonia)    4 times   Sleep apnea    wears CPAP sometimes per pt.   05/25/19 - not able to use at this time due to neck pain.   Thyroid disease    take Synthroid d/t scar tissue from radiation around thyroid   Ulcer     Past Surgical History:  Procedure Laterality Date   ANTERIOR CERVICAL DECOMP/DISCECTOMY FUSION N/A 05/27/2020   Procedure: Cervical Three-Four Anterior cervical decompression/discectomy/fusion;  Surgeon: Ashok Pall, MD;  Location: [facility];  Service: Neurosurgery;  Laterality: N/A;  anterior   BACK SURGERY     COLONOSCOPY WITH PROPOFOL N/A 01/06/2020    Procedure: COLONOSCOPY WITH PROPOFOL;  Surgeon: Lavena Bullion, DO;  [facility];  Service: Gastroenterology;  Laterality: N/A;   ELBOW SURGERY Left    HEMOSTASIS CLIP PLACEMENT  01/06/2020   Procedure: HEMOSTASIS CLIP PLACEMENT;  Surgeon: Lavena Bullion, DO;  [facility];  Service: Gastroenterology;;   POLYPECTOMY  01/06/2020   Procedure: POLYPECTOMY;  Surgeon: Lavena Bullion, DO;  [facility];  Service: Gastroenterology;;   SHOULDER SURGERY     TONSILLECTOMY     tracheotomy     WRIST SURGERY     right    There were no vitals filed for this visit.   Subjective Assessment - 10/25/20 1020     Subjective It's OK I guess.  I think the ex and DN help.  I've gotten in the habit of doing the ex's while I watch TV.  Some posterior headaches.    Pertinent History lumbar fusion 2004   March 4th 2022   Radiation damage to throat--finished CA treatment (had chemo too) 5 years ago; left rotator cuff tear (plans to have surgery)    Currently in Pain? Yes    Pain Score 3     Pain Location Neck  Pain Orientation Right    Pain Type Chronic pain    Aggravating Factors  looking up; turning head                               [facility] Adult PT Treatment/Exercise - 10/25/20 0001       Neck Exercises: Standing   Neck Retraction Limitations      Neck Exercises: Seated   Other Seated Exercise thoracic extension with inhale against towel roll 10x    Other Seated Exercise holding ball to wall neutral with mirror feedback 2 min with 4 drops      Neck Exercises: Supine   Other Supine Exercise propped on wedge:cervical rotation 8x; cervical retraction 10x    Other Supine Exercise propped on wedge with scap retract and elbow press down 10x; 5# golf club press 15x      Neck Exercises: Prone   Other Prone Exercise quadruped head lift to neutral 2x 5 and with rotation 5x each way   2nd set with hold     Manual Therapy   Soft tissue  mobilization bil cervical paraspinals, upper traps and suboccipitals              Trigger Point Dry Needling - 10/25/20 0001     Consent Given? Yes    Education Handout Provided Yes    Muscles Treated Head and Neck Upper trapezius;Suboccipitals;Cervical multifidi    Other Dry Needling bil    Upper Trapezius Response Twitch reponse elicited    Suboccipitals Response Palpable increased muscle length    Cervical multifidi Response Palpable increased muscle length                    PT Short Term Goals - 10/18/20 1518       PT SHORT TERM GOAL #1   Title The patient will demonstrate basic HEP to promote ROM and mobility of neck    Status Achieved      PT SHORT TERM GOAL #2   Title The patient will be able to hold head in neutral for 1 minute for light household and yardwork    Status Achieved      PT SHORT TERM GOAL #3   Title The patient will have improved cervical rotation to 40 degrees needed for driving    Time 6    Period Weeks    Status On-going      PT SHORT TERM GOAL #4   Title The patient will report a 30% improvement in neck pain with usual ADLs    Time 6    Period Weeks    Status On-going               PT Long Term Goals - 09/13/20 1756       PT LONG TERM GOAL #1   Title The patient will be independent with safe self progression of HEP    Time 12    Period Weeks    Status New    Target Date 12/06/20      PT LONG TERM GOAL #2   Title The patient will be able to hold head in neutral for 2 minutes for household chores and yardwork (trimming limbs)    Time 12    Period Weeks    Status New      PT LONG TERM GOAL #3   Title The patient will have improved cervical extension to 15 degrees and  bil sidebending to 40 degrees needed for yardwork    Time 12    Period Weeks    Status New      PT LONG TERM GOAL #4   Title The patient will report a 60% improvement in pain and performance of ADLs    Time 12    Period Weeks    Status New       PT LONG TERM GOAL #5   Title FOTO functional outcome score improved to 61%    Time 12    Period Weeks    Status New                   Plan - 10/25/20 1059     Clinical Impression Statement The patient continues to hold head in a flexed/protruded position with a slight head tilt.  Improved with visual mirror feedback.  Able to hold head in neutral for short periods of time but unable to sustain.  Unable to do overhead movements secondary to left shoulder pathology.  Tender points right > left cervical musculature improved following DN and manual therapy.  Shortened suboccipitals, SCM and scalenes.  Therapist monitoring response with all interventions.    Comorbidities history of CA base of tongue with radiation/chemo; COPD; HTN; lumbar fusion 2004;  left rotator cuff tear    Examination-Participation Restrictions Cleaning;Driving;Yard Work;Shop;Community Activity    Rehab Potential Good    PT Frequency 2x / week    PT Duration 12 weeks    PT Treatment/Interventions ADLs/Self Care Home Management;Aquatic Therapy;Cryotherapy;Electrical Stimulation;Ultrasound;Moist Heat;Therapeutic activities;Therapeutic exercise;Neuromuscular re-education;Manual techniques;Patient/family education;Dry needling;Taping    PT Next Visit Plan .postural ex; cervical extensor muscle strengthening; cervical mobility; manual therapy as needed; discharge planned in 3 more visits    PT Home Exercise Plan [id number]             Patient will benefit from skilled therapeutic intervention in order to improve the following deficits and impairments:  Pain, Decreased range of motion, Increased fascial restricitons, Decreased strength, Postural dysfunction, Impaired perceived functional ability, Decreased activity tolerance, Increased muscle spasms, Impaired UE functional use  Visit Diagnosis: Abnormal posture  Muscle weakness (generalized)  Cramp and spasm  Cervicalgia     Problem List Patient Active  Problem List   Diagnosis Date Noted   HNP (herniated nucleus pulposus) with myelopathy, cervical 05/27/2020   Positive FIT (fecal immunochemical test)    Polyp of ascending colon    Adenomatous polyp of sigmoid colon    Diverticulosis of colon without hemorrhage    Grade II internal hemorrhoids    Malignant neoplasm of base of tongue ([place]) 10/27/2014   Chronic kidney disease    COPD (chronic obstructive pulmonary disease) ([place]) 07/10/2012   Chest pain 07/10/2012   HTN (hypertension) 07/10/2012   Hyponatremia 07/10/2012   TOBACCO USE 02/07/2010   Ruben Im, PT 10/25/20 1:15 PM Phone: [phone] Fax: [phone]  Alvera Singh 10/25/2020, 1:14 PM  [facility] [address], [place], [place], [address] Phone: [phone]   Fax:  [phone]  Name: George Nielsen MRN: [id number] Date of Birth: 12/24/1955

## 2020-10-27 ENCOUNTER — Other Ambulatory Visit: Payer: Self-pay

## 2020-10-27 ENCOUNTER — Ambulatory Visit: Payer: No Typology Code available for payment source | Admitting: Physical Therapy

## 2020-10-27 DIAGNOSIS — R293 Abnormal posture: Secondary | ICD-10-CM | POA: Diagnosis not present

## 2020-10-27 DIAGNOSIS — M6281 Muscle weakness (generalized): Secondary | ICD-10-CM

## 2020-10-27 DIAGNOSIS — R2689 Other abnormalities of gait and mobility: Secondary | ICD-10-CM

## 2020-10-27 DIAGNOSIS — R252 Cramp and spasm: Secondary | ICD-10-CM

## 2020-10-27 NOTE — Therapy (Signed)
[facility] [address] [place], [place], [place], [address] Phone: [phone]   Fax:  [phone]  Physical Therapy Treatment  Patient Details  Name: George Nielsen MRN: [id number] Date of Birth: 11-25-1955 Referring Provider (PT): Dr. Christella Noa   Encounter Date: 10/27/2020   PT End of Session - 10/27/20 1052     Visit Number 13    Number of Visits 15    Date for PT Re-Evaluation 12/06/20    Authorization Type 15 [facility]    Authorization - Visit Number 10    Authorization - Number of Visits 15    PT Start Time 1017    PT Stop Time 1056    PT Time Calculation (min) 39 min    Activity Tolerance Patient tolerated treatment well    Behavior During Therapy [facility] for tasks assessed/performed             Past Medical History:  Diagnosis Date   Allergy    Anemia    Anxiety    Arthritis    Cancer ([place])    tongue and neck lymp nodes   COPD (chronic obstructive pulmonary disease) ([place])    Dyspnea    climbing stairs, walking distance   Emphysema of lung ([place])    GERD (gastroesophageal reflux disease)    Hepatitis C    treated   History of radiation therapy 11/10/14- 12/29/14   BOT and bilateral neck/ 70 Gy in 35 fractions to gross disease, 63 Gy in 35 fractions to high risk nodal echelons, and 56 Gy in 35 fraction to intermediate risk nodal echelons.    Hypertension    Hypothyroidism    PNA (pneumonia)    4 times   Sleep apnea    wears CPAP sometimes per pt.   05/25/19 - not able to use at this time due to neck pain.   Thyroid disease    take Synthroid d/t scar tissue from radiation around thyroid   Ulcer     Past Surgical History:  Procedure Laterality Date   ANTERIOR CERVICAL DECOMP/DISCECTOMY FUSION N/A 05/27/2020   Procedure: Cervical Three-Four Anterior cervical decompression/discectomy/fusion;  Surgeon: Ashok Pall, MD;  Location: [place];  Service: Neurosurgery;  Laterality: N/A;  anterior    BACK SURGERY     COLONOSCOPY WITH PROPOFOL N/A 01/06/2020   Procedure: COLONOSCOPY WITH PROPOFOL;  Surgeon: Lavena Bullion, DO;  [facility];  Service: Gastroenterology;  Laterality: N/A;   ELBOW SURGERY Left    HEMOSTASIS CLIP PLACEMENT  01/06/2020   Procedure: HEMOSTASIS CLIP PLACEMENT;  Surgeon: Lavena Bullion, DO;  [facility];  Service: Gastroenterology;;   POLYPECTOMY  01/06/2020   Procedure: POLYPECTOMY;  Surgeon: Lavena Bullion, DO;  [facility];  Service: Gastroenterology;;   SHOULDER SURGERY     TONSILLECTOMY     tracheotomy     WRIST SURGERY     right    There were no vitals filed for this visit.   Subjective Assessment - 10/27/20 1020     Subjective I had a bad headache yesterday, I have them every once in awhile but yesterday was worse.    Pertinent History lumbar fusion 2004   March 4th 2022   Radiation damage to throat--finished CA treatment (had chemo too) 5 years ago; left rotator cuff tear (plans to have surgery)    Limitations Sitting    How long can you sit comfortably? a good while    Diagnostic tests MRI  Patient Stated Goals more movement; less pain; hold head up    Currently in Pain? Yes    Pain Score 2     Pain Location Neck    Pain Orientation Posterior   at base of skull   Pain Descriptors / Indicators Sore    Pain Type Chronic pain    Pain Frequency Constant    Aggravating Factors  looking up; turning head    Pain Relieving Factors sleep and rest                               [facility] Adult PT Treatment/Exercise - 10/27/20 0001       Neck Exercises: Stretches   Other Neck Stretches in quad: cervical extension to neutral 5x5s holds    Other Neck Stretches seated: 2x3 lateral flexion, rotation each      Lumbar Exercises: Aerobic   UBE (Upper Arm Bike) 3 mins forward, 3 mins backward L1.5. Therapist providing cues for head carriage throughout session      Lumbar Exercises: Seated    Other Seated Lumbar Exercises trunk extension with overhead reach and head to look up x10    Other Seated Lumbar Exercises trunk extension with ball rolling forward with BUE extended onto ball 3x20s      Shoulder Exercises: Seated   Row Both;10 reps;Strengthening;Theraband   VC for improved head carriage and decreased cervical flexion during activity   Theraband Level (Shoulder Row) Level 3 (Green)    Horizontal ABduction Strengthening;Both;10 reps   VC and tactile cues for posture and technique   Theraband Level (Shoulder Horizontal ABduction) Level 3 (Green)   tactile cues for technique and noted compensatory strategies attempted to achieve excerise but improved with cues     Manual Therapy   Manual Therapy Soft tissue mobilization    Soft tissue mobilization bil cervical paraspinals, upper traps and suboccipitals                    PT Education - 10/27/20 1051     Education Details Pt educated on technique with all exercises    Person(s) Educated Patient    Methods Explanation;Verbal cues;Tactile cues;Demonstration    Comprehension Verbalized understanding;Returned demonstration              PT Short Term Goals - 10/18/20 1518       PT SHORT TERM GOAL #1   Title The patient will demonstrate basic HEP to promote ROM and mobility of neck    Status Achieved      PT SHORT TERM GOAL #2   Title The patient will be able to hold head in neutral for 1 minute for light household and yardwork    Status Achieved      PT SHORT TERM GOAL #3   Title The patient will have improved cervical rotation to 40 degrees needed for driving    Time 6    Period Weeks    Status On-going      PT SHORT TERM GOAL #4   Title The patient will report a 30% improvement in neck pain with usual ADLs    Time 6    Period Weeks    Status On-going               PT Long Term Goals - 09/13/20 1756       PT LONG TERM GOAL #1   Title The patient will be independent with  safe self  progression of HEP    Time 12    Period Weeks    Status New    Target Date 12/06/20      PT LONG TERM GOAL #2   Title The patient will be able to hold head in neutral for 2 minutes for household chores and yardwork (trimming limbs)    Time 12    Period Weeks    Status New      PT LONG TERM GOAL #3   Title The patient will have improved cervical extension to 15 degrees and bil sidebending to 40 degrees needed for yardwork    Time 12    Period Weeks    Status New      PT LONG TERM GOAL #4   Title The patient will report a 60% improvement in pain and performance of ADLs    Time 12    Period Weeks    Status New      PT LONG TERM GOAL #5   Title FOTO functional outcome score improved to 61%    Time 12    Period Weeks    Status New                   Plan - 10/27/20 1052     Clinical Impression Statement Pt reports he had a bad headache yesterday but felt better today. Pt session focused on manual work at posterior neck at suboccipitals and upper traps, cervical extension and posture exercises. Pt reports felt "looser" at end of session. Pt would benefit from continuted PT for improved mobility at neck and tolerance to activity with improved posture for safety with driving and ADLs.    Personal Factors and Comorbidities Comorbidity 1;Comorbidity 2;Comorbidity 3+    Comorbidities history of CA base of tongue with radiation/chemo; COPD; HTN; lumbar fusion 2004;  left rotator cuff tear    Examination-Activity Limitations Reach Overhead;Carry;Lift;Dressing;Other    Examination-Participation Restrictions Cleaning;Driving;Yard Work;Shop;Community Activity    Stability/Clinical Decision Making Evolving/Moderate complexity    Clinical Decision Making Moderate    Rehab Potential Good    PT Frequency 2x / week    PT Duration 12 weeks    PT Treatment/Interventions ADLs/Self Care Home Management;Aquatic Therapy;Cryotherapy;Electrical Stimulation;Ultrasound;Moist Heat;Therapeutic  activities;Therapeutic exercise;Neuromuscular re-education;Manual techniques;Patient/family education;Dry needling;Taping    PT Next Visit Plan .postural ex; cervical extensor muscle strengthening; cervical mobility; manual therapy as needed; discharge planned in 3 more visits    PT Home Exercise Plan [id number]    Consulted and Agree with Plan of Care Patient             Patient will benefit from skilled therapeutic intervention in order to improve the following deficits and impairments:  Pain, Decreased range of motion, Increased fascial restricitons, Decreased strength, Postural dysfunction, Impaired perceived functional ability, Decreased activity tolerance, Increased muscle spasms, Impaired UE functional use  Visit Diagnosis: Other abnormalities of gait and mobility  Cramp and spasm  Muscle weakness (generalized)     Problem List Patient Active Problem List   Diagnosis Date Noted   HNP (herniated nucleus pulposus) with myelopathy, cervical 05/27/2020   Positive FIT (fecal immunochemical test)    Polyp of ascending colon    Adenomatous polyp of sigmoid colon    Diverticulosis of colon without hemorrhage    Grade II internal hemorrhoids    Malignant neoplasm of base of tongue ([place]) 10/27/2014   Chronic kidney disease    COPD (chronic obstructive pulmonary disease) ([place]) 07/10/2012   Chest  pain 07/10/2012   HTN (hypertension) 07/10/2012   Hyponatremia 07/10/2012   TOBACCO USE 02/07/2010    Stacy Gardner, PT 10/27/2208:57 AM   [facility] [address] [place], [place], [address] Phone: [phone]   Fax:  [phone]  Name: George Nielsen MRN: [id number] Date of Birth: 09/21/1955

## 2020-11-03 ENCOUNTER — Other Ambulatory Visit: Payer: Self-pay

## 2020-11-03 ENCOUNTER — Ambulatory Visit: Payer: No Typology Code available for payment source | Admitting: Physical Therapy

## 2020-11-03 DIAGNOSIS — M6281 Muscle weakness (generalized): Secondary | ICD-10-CM

## 2020-11-03 DIAGNOSIS — M542 Cervicalgia: Secondary | ICD-10-CM

## 2020-11-03 DIAGNOSIS — R293 Abnormal posture: Secondary | ICD-10-CM | POA: Diagnosis not present

## 2020-11-03 DIAGNOSIS — R2689 Other abnormalities of gait and mobility: Secondary | ICD-10-CM

## 2020-11-03 DIAGNOSIS — R252 Cramp and spasm: Secondary | ICD-10-CM

## 2020-11-03 NOTE — Patient Instructions (Signed)
Access Code: [email or web address] URL: [email or web address] Date: 11/03/2020 Prepared by: Ruben Im  Exercises Supine Cervical Retraction with Towel - 5 x daily - 7 x weekly - 1 sets - 5-8 reps - 5 hold Supine Scapular Retraction - 5 x daily - 7 x weekly - 1 sets - 5-8 reps - 5 hold Supine Shoulder Press - 5 x daily - 7 x weekly - 1 sets - 5 reps - 5 hold Supine Cervical Rotation AROM on Pillow - 5 x daily - 7 x weekly - 2 sets - 10 reps Supine Isometric Neck Sidebend (Lateral Flexion as an alternative name) - 5 x daily - 7 x weekly - 2 sets - 5 reps - 5 hold Supine Isometric Neck Rotation - 5 x daily - 7 x weekly - 2 sets - 5 reps - 5 hold Cervical Retraction with Resistance - 5 x daily - 7 x weekly - 1 sets - 5-10 reps - 5 hold Isometric Cervical Flexion at Wall with Ball - 1 x daily - 7 x weekly - 1 sets - 10 reps - 5 hold Standing Row with Anchored Resistance - 1 x daily - 7 x weekly - 2 sets - 10 reps Standing Shoulder Extension with Resistance - 1 x daily - 7 x weekly - 2 sets - 10 reps

## 2020-11-03 NOTE — Therapy (Signed)
[facility] [address] [place], [place], [place], [address] Phone: [phone]   Fax:  [phone]  Physical Therapy Treatment  Patient Details  Name: George Nielsen MRN: [id number] Date of Birth: [birth year] Referring Provider (PT): Dr. Christella Noa   Encounter Date: 11/03/2020   PT End of Session - 11/03/20 1736     Visit Number 14    Number of Visits 15    Date for PT Re-Evaluation 12/06/20    Authorization Type 15 [facility]    Authorization - Number of Visits 15    PT Start Time 1100    PT Stop Time 1140    PT Time Calculation (min) 40 min    Activity Tolerance Patient tolerated treatment well             Past Medical History:  Diagnosis Date   Allergy    Anemia    Anxiety    Arthritis    Cancer ([place])    tongue and neck lymp nodes   COPD (chronic obstructive pulmonary disease) ([place])    Dyspnea    climbing stairs, walking distance   Emphysema of lung ([place])    GERD (gastroesophageal reflux disease)    Hepatitis C    treated   History of radiation therapy 11/10/14- 12/29/14   BOT and bilateral neck/ 70 Gy in 35 fractions to gross disease, 63 Gy in 35 fractions to high risk nodal echelons, and 56 Gy in 35 fraction to intermediate risk nodal echelons.    Hypertension    Hypothyroidism    PNA (pneumonia)    4 times   Sleep apnea    wears CPAP sometimes per pt.   05/25/19 - not able to use at this time due to neck pain.   Thyroid disease    take Synthroid d/t scar tissue from radiation around thyroid   Ulcer     Past Surgical History:  Procedure Laterality Date   ANTERIOR CERVICAL DECOMP/DISCECTOMY FUSION N/A 05/27/2020   Procedure: Cervical Three-Four Anterior cervical decompression/discectomy/fusion;  Surgeon: Ashok Pall, MD;  Location: [place];  Service: Neurosurgery;  Laterality: N/A;  anterior   BACK SURGERY     COLONOSCOPY WITH PROPOFOL N/A 01/06/2020   Procedure: COLONOSCOPY WITH  PROPOFOL;  Surgeon: Lavena Bullion, DO;  [facility];  Service: Gastroenterology;  Laterality: N/A;   ELBOW SURGERY Left    HEMOSTASIS CLIP PLACEMENT  01/06/2020   Procedure: HEMOSTASIS CLIP PLACEMENT;  Surgeon: Lavena Bullion, DO;  [facility];  Service: Gastroenterology;;   POLYPECTOMY  01/06/2020   Procedure: POLYPECTOMY;  Surgeon: Lavena Bullion, DO;  [facility];  Service: Gastroenterology;;   SHOULDER SURGERY     TONSILLECTOMY     tracheotomy     WRIST SURGERY     right    There were no vitals filed for this visit.   Subjective Assessment - 11/03/20 1104     Subjective I've had headaches the last 2 days    Pertinent History lumbar fusion 2004   March 4th 2022   Radiation damage to throat--finished CA treatment (had chemo too) 5 years ago; left rotator cuff tear (plans to have surgery)    Currently in Pain? Yes    Pain Score 3     Pain Location Neck                               [facility]  Adult PT Treatment/Exercise - 11/03/20 0001       Neck Exercises: Standing   Other Standing Exercises facing wall with forehead lift offs of the towel roll 5 sec hold 25x    Other Standing Exercises green band row achored on doorknob 20x; over the top of the door green band lat pull downs 20x      Neck Exercises: Seated   Other Seated Exercise review of complete HEP and discussion of home performance    Other Seated Exercise yellow band self resisted neck extension/retracted  25x      Neck Exercises: Supine   Other Supine Exercise propped on wedge:cervical rotation 8x; cervical retraction 10x    Other Supine Exercise propped on wedge with scap retract and elbow press down 10x; 5# golf club press 15x                      PT Short Term Goals - 10/18/20 1518       PT SHORT TERM GOAL #1   Title The patient will demonstrate basic HEP to promote ROM and mobility of neck    Status Achieved      PT SHORT TERM GOAL  #2   Title The patient will be able to hold head in neutral for 1 minute for light household and yardwork    Status Achieved      PT SHORT TERM GOAL #3   Title The patient will have improved cervical rotation to 40 degrees needed for driving    Time 6    Period Weeks    Status On-going      PT SHORT TERM GOAL #4   Title The patient will report a 30% improvement in neck pain with usual ADLs    Time 6    Period Weeks    Status On-going               PT Long Term Goals - 09/13/20 1756       PT LONG TERM GOAL #1   Title The patient will be independent with safe self progression of HEP    Time 12    Period Weeks    Status New    Target Date 12/06/20      PT LONG TERM GOAL #2   Title The patient will be able to hold head in neutral for 2 minutes for household chores and yardwork (trimming limbs)    Time 12    Period Weeks    Status New      PT LONG TERM GOAL #3   Title The patient will have improved cervical extension to 15 degrees and bil sidebending to 40 degrees needed for yardwork    Time 12    Period Weeks    Status New      PT LONG TERM GOAL #4   Title The patient will report a 60% improvement in pain and performance of ADLs    Time 12    Period Weeks    Status New      PT LONG TERM GOAL #5   Title FOTO functional outcome score improved to 61%    Time 12    Period Weeks    Status New                   Plan - 11/03/20 1737     Clinical Impression Statement Treatment focus on establishing a HEP that could be easily replicated at home in sitting, supine  or standing facing the wall.  Emphasis on strengthening cervical extensors for improved postural alignment.  Mirror is helpful for holding head in midline.   Therapist monitoring and modifying ex's as appropriate.   Anticipate discharge from PT next visit.    Comorbidities history of CA base of tongue with radiation/chemo; COPD; HTN; lumbar fusion 2004;  left rotator cuff tear    Rehab Potential  Good    PT Frequency 2x / week    PT Duration 12 weeks    PT Treatment/Interventions ADLs/Self Care Home Management;Aquatic Therapy;Cryotherapy;Electrical Stimulation;Ultrasound;Moist Heat;Therapeutic activities;Therapeutic exercise;Neuromuscular re-education;Manual techniques;Patient/family education;Dry needling;Taping    PT Next Visit Plan check remaining goals, FOTO; review HEP for discharge next visit    PT Home Exercise Plan [phone]             Patient will benefit from skilled therapeutic intervention in order to improve the following deficits and impairments:  Pain, Decreased range of motion, Increased fascial restricitons, Decreased strength, Postural dysfunction, Impaired perceived functional ability, Decreased activity tolerance, Increased muscle spasms, Impaired UE functional use  Visit Diagnosis: Other abnormalities of gait and mobility  Cramp and spasm  Muscle weakness (generalized)  Abnormal posture  Cervicalgia     Problem List Patient Active Problem List   Diagnosis Date Noted   HNP (herniated nucleus pulposus) with myelopathy, cervical 05/27/2020   Positive FIT (fecal immunochemical test)    Polyp of ascending colon    Adenomatous polyp of sigmoid colon    Diverticulosis of colon without hemorrhage    Grade II internal hemorrhoids    Malignant neoplasm of base of tongue ([place]) 10/27/2014   Chronic kidney disease    COPD (chronic obstructive pulmonary disease) ([place]) 07/10/2012   Chest pain 07/10/2012   HTN (hypertension) 07/10/2012   Hyponatremia 07/10/2012   TOBACCO USE 02/07/2010   Ruben Im, PT 11/03/20 5:41 PM Phone: [phone] Fax: [phone]  Alvera Singh 11/03/2020, 5:41 PM  [facility] [address] [place], [place], [address] Phone: [phone]   Fax:  [phone]  Name: George Nielsen MRN: [id number] Date of Birth: 08/13/1955

## 2020-11-08 ENCOUNTER — Ambulatory Visit: Payer: No Typology Code available for payment source | Admitting: Physical Therapy

## 2020-11-08 ENCOUNTER — Other Ambulatory Visit: Payer: Self-pay

## 2020-11-08 DIAGNOSIS — M542 Cervicalgia: Secondary | ICD-10-CM

## 2020-11-08 DIAGNOSIS — R293 Abnormal posture: Secondary | ICD-10-CM | POA: Diagnosis not present

## 2020-11-08 DIAGNOSIS — R252 Cramp and spasm: Secondary | ICD-10-CM

## 2020-11-08 DIAGNOSIS — M6281 Muscle weakness (generalized): Secondary | ICD-10-CM

## 2020-11-08 NOTE — Therapy (Signed)
[facility] [address], [place], [place], [address] Phone: [phone]   Fax:  [phone]  Physical Therapy Treatment/Discharge Summary   Patient Details  Name: George Nielsen MRN: [id number] Date of Birth: 27-Sep-1955 Referring Provider (PT): Dr. Christella Noa   Encounter Date: 11/08/2020   PT End of Session - 11/08/20 1218     Visit Number 15    Number of Visits 15    Date for PT Re-Evaluation 12/06/20    Authorization Type 15 visits [facility]-10/14 [facility]    Authorization - Number of Visits 15    PT Start Time 6073    PT Stop Time 7106   discharge visit   PT Time Calculation (min) 30 min    Activity Tolerance Patient tolerated treatment well             Past Medical History:  Diagnosis Date   Allergy    Anemia    Anxiety    Arthritis    Cancer ([place])    tongue and neck lymp nodes   COPD (chronic obstructive pulmonary disease) ([place])    Dyspnea    climbing stairs, walking distance   Emphysema of lung ([place])    GERD (gastroesophageal reflux disease)    Hepatitis C    treated   History of radiation therapy 11/10/14- 12/29/14   BOT and bilateral neck/ 70 Gy in 35 fractions to gross disease, 63 Gy in 35 fractions to high risk nodal echelons, and 56 Gy in 35 fraction to intermediate risk nodal echelons.    Hypertension    Hypothyroidism    PNA (pneumonia)    4 times   Sleep apnea    wears CPAP sometimes per pt.   05/25/19 - not able to use at this time due to neck pain.   Thyroid disease    take Synthroid d/t scar tissue from radiation around thyroid   Ulcer     Past Surgical History:  Procedure Laterality Date   ANTERIOR CERVICAL DECOMP/DISCECTOMY FUSION N/A 05/27/2020   Procedure: Cervical Three-Four Anterior cervical decompression/discectomy/fusion;  Surgeon: Ashok Pall, MD;  Location: [facility];  Service: Neurosurgery;  Laterality: N/A;  anterior   BACK SURGERY     COLONOSCOPY WITH PROPOFOL N/A 01/06/2020    Procedure: COLONOSCOPY WITH PROPOFOL;  Surgeon: Lavena Bullion, DO;  [facility];  Service: Gastroenterology;  Laterality: N/A;   ELBOW SURGERY Left    HEMOSTASIS CLIP PLACEMENT  01/06/2020   Procedure: HEMOSTASIS CLIP PLACEMENT;  Surgeon: Lavena Bullion, DO;  [facility];  Service: Gastroenterology;;   POLYPECTOMY  01/06/2020   Procedure: POLYPECTOMY;  Surgeon: Lavena Bullion, DO;  [facility];  Service: Gastroenterology;;   SHOULDER SURGERY     TONSILLECTOMY     tracheotomy     WRIST SURGERY     right    There were no vitals filed for this visit.   Subjective Assessment - 11/08/20 1144     Subjective I've had headaches 2 days this week.  These muscles are always tight.    Pertinent History lumbar fusion 2004   March 4th 2022   Radiation damage to throat--finished CA treatment (had chemo too) 5 years ago; left rotator cuff tear (plans to have surgery)    Currently in Pain? Yes    Pain Score 3     Pain Location Neck    Pain Type Chronic pain  [facility] PT Assessment - 11/08/20 0001       Observation/Other Assessments   Focus on Therapeutic Outcomes (FOTO)  53%      AROM   Cervical Extension 10    Cervical - Right Side Bend 50    Cervical - Left Side Bend 35    Cervical - Right Rotation 30    Cervical - Left Rotation 25      Strength   Overall Strength Comments able to hold 1:30 head in neutral with cues    Cervical Extension 2/5    Cervical - Right Side Bend 3/5    Cervical - Left Side Bend 3/5    Cervical - Right Rotation 3/5                           [facility] Adult PT Treatment/Exercise - 11/08/20 0001       Neck Exercises: Seated   Other Seated Exercise review of complete HEP and discussion of home performance and areas of focus      Manual Therapy   Soft tissue mobilization bil cervical paraspinals, upper traps              Trigger Point Dry Needling - 11/08/20 0001      Consent Given? Yes    Dry Needling Comments abrasion at C7 avoided; no suboccipitals today at pt request    Other Dry Needling bil    Upper Trapezius Response Twitch reponse elicited    Cervical multifidi Response Palpable increased muscle length                    PT Short Term Goals - 11/08/20 1209       PT SHORT TERM GOAL #1   Title The patient will demonstrate basic HEP to promote ROM and mobility of neck    Status Achieved      PT SHORT TERM GOAL #2   Title The patient will be able to hold head in neutral for 1 minute for light household and yardwork    Status Achieved      PT SHORT TERM GOAL #3   Title The patient will have improved cervical rotation to 40 degrees needed for driving    Status Partially Met      PT SHORT TERM GOAL #4   Title The patient will report a 30% improvement in neck pain with usual ADLs    Status Partially Met               PT Long Term Goals - 11/08/20 1209       PT LONG TERM GOAL #1   Title The patient will be independent with safe self progression of HEP    Status Achieved      PT LONG TERM GOAL #2   Title The patient will be able to hold head in neutral for 2 minutes for household chores and yardwork (trimming limbs)    Status Partially Met      PT LONG TERM GOAL #3   Title The patient will have improved cervical extension to 15 degrees and bil sidebending to 40 degrees needed for yardwork    Status Partially Met      PT LONG TERM GOAL #4   Title The patient will report a 60% improvement in pain and performance of ADLs    Status Not Met      PT LONG TERM GOAL #5   Title FOTO functional  outcome score improved to 61%    Status Not Met                   Plan - 11/08/20 1221     Clinical Impression Statement The patient reports numbness superior left shoulder has improved since surgery.  He reports modest improvements in pain of 5-10%.   Overall sitting posture with head protruded and flexed but with cuing  he is able to hold in neutral 1 1/2 minutes.  Treatment focus has been on extensor muscle strengthening and he has been instructed in a HEP to further encourage this.  Mild improvements in cervical ROM in all planes but he would benefit from continued home ex performance for improved rotation needed for driving.  Recommend discharge to HEP with partial goals met.    Comorbidities history of CA base of tongue with radiation/chemo; COPD; HTN; lumbar fusion 2004;  left rotator cuff tear            PHYSICAL THERAPY DISCHARGE SUMMARY  Visits from Start of Care: 15  Current functional level related to goals / functional outcomes: See clinical impressions above   Remaining deficits: As above   Education / Equipment: HEP   Patient agrees to discharge. Patient goals were partially met. Patient is being discharged due to maximized rehab potential.   Patient will benefit from skilled therapeutic intervention in order to improve the following deficits and impairments:     Visit Diagnosis: Cramp and spasm  Muscle weakness (generalized)  Abnormal posture  Cervicalgia     Problem List Patient Active Problem List   Diagnosis Date Noted   HNP (herniated nucleus pulposus) with myelopathy, cervical 05/27/2020   Positive FIT (fecal immunochemical test)    Polyp of ascending colon    Adenomatous polyp of sigmoid colon    Diverticulosis of colon without hemorrhage    Grade II internal hemorrhoids    Malignant neoplasm of base of tongue ([place]) 10/27/2014   Chronic kidney disease    COPD (chronic obstructive pulmonary disease) ([place]) 07/10/2012   Chest pain 07/10/2012   HTN (hypertension) 07/10/2012   Hyponatremia 07/10/2012   TOBACCO USE 02/07/2010   Ruben Im, PT 11/08/20 7:19 PM Phone: [phone] Fax: [phone]  Alvera Singh 11/08/2020, 7:18 PM  [facility] [address] [place], [place], [address] Phone:  [phone]   Fax:  [phone]  Name: George Nielsen MRN: [id number] Date of Birth: 05/24/1955

## 2020-11-10 ENCOUNTER — Other Ambulatory Visit: Payer: Self-pay

## 2020-12-12 ENCOUNTER — Other Ambulatory Visit (HOSPITAL_COMMUNITY): Payer: Self-pay

## 2020-12-12 DIAGNOSIS — R1310 Dysphagia, unspecified: Secondary | ICD-10-CM

## 2020-12-20 ENCOUNTER — Other Ambulatory Visit: Payer: Self-pay

## 2020-12-20 ENCOUNTER — Ambulatory Visit (HOSPITAL_COMMUNITY)
Admission: RE | Admit: 2020-12-20 | Discharge: 2020-12-20 | Disposition: A | Discharge status: Home / Self Care | Payer: No Typology Code available for payment source | Source: Ambulatory Visit | Attending: Internal Medicine | Admitting: Internal Medicine

## 2020-12-20 DIAGNOSIS — R1310 Dysphagia, unspecified: Secondary | ICD-10-CM | POA: Diagnosis present

## 2020-12-20 DIAGNOSIS — R1312 Dysphagia, oropharyngeal phase: Secondary | ICD-10-CM

## 2020-12-20 NOTE — Therapy (Signed)
Modified Barium Swallow Progress Note  Patient Details  Name: George Nielsen MRN: [id number] Date of Birth: 03/30/1955  Today's Date: 12/20/2020  Modified Barium Swallow completed.  Full report located under Chart Review in the Imaging Section.  Brief recommendations include the following:  Clinical Impression Pt presents with mild oral dysphagia. Premature spillage is noted over the tongue base across consistencies, as pt has difficulty generating the trigger of the swallow reflex. Pt reports difficulty with xerostomia and that foods have very little taste to him.   Pharyngeal swallow is characterized by delayed trigger of the reflex, reduced contraction of pharyngeal musculature, and poor airway closure. This results in post-swallow residue in the vallecular and pyriform sinuses, which intermittently spills into the laryngeal vestibule. No difference was noted between thin and nectar thick liquid. Increased post-swallow residue was noted after presentation of puree consistency. Liquid was was attempted to clear out the residue, and resulted in trace aspiration. No cough response was elicited.   Pt is at increased risk for aspiration with all PO. Cued throat clear/cough effectively removed penetrate but not aspirate. Pt was educated regarding safe swallow strategies to maximize PO safety. This information was provided in written form and given to pt as well. Pt was also encouraged to return to outpatient speech therapy for continued education and dysphagia treatment.   Recommend soft, moist solids and thin liquids. Recommend frequent cough/throat clear to remove penetrate from the laryngeal vestibule. Pills to be taken as pt tolerates - there are some he can manage with liquid, others he needs to crush. Pt was also encouraged to perform good oral care 2-3x/day to minimize bacterial load and decrease risk of infection.     Swallow Evaluation Recommendations  SLP Diet Recommendations:  Regular solids;Thin liquid   Liquid Administration via: Cup;Straw   Medication Administration: Other (Comment)   Supervision: Patient able to self feed (as tolerated)   Compensations: Slow rate;Small sips/bites;Clear throat after each swallow   Postural Changes: Remain semi-upright after after feeds/meals (Comment);Seated upright at 90 degrees   Oral Care Recommendations: Oral care BID      Kaynan Klonowski B. Quentin Ore, [facility], [place] Speech Language Pathologist Office: [phone]  Shonna Chock 12/20/2020,2:39 PM

## 2021-01-02 ENCOUNTER — Encounter: Payer: Self-pay | Admitting: Speech Pathology

## 2021-01-02 ENCOUNTER — Other Ambulatory Visit: Payer: Self-pay

## 2021-01-02 ENCOUNTER — Ambulatory Visit: Payer: No Typology Code available for payment source | Attending: Neurosurgery | Admitting: Speech Pathology

## 2021-01-02 DIAGNOSIS — R1312 Dysphagia, oropharyngeal phase: Secondary | ICD-10-CM | POA: Insufficient documentation

## 2021-01-02 NOTE — Patient Instructions (Addendum)
    Small bites, chew well  Hard swallow  Cough  Hard swallow  Alternate solids and liquids     SWALLOWING EXERCISES Effortful Swallows - Squeeze hard with the muscles in your neck while you swallow your  saliva or a sip of water - Repeat 20 times, 2-3 times a day, and use whenever you eat or drink  Masako Swallow - swallow with your tongue sticking out - Stick tongue out and gently bite tongue with your teeth - Swallow, while holding your tongue with your teeth - Repeat 20 times, 2-3 times a day (if you need water, take a normal sip/swallow, then try tongue out swallow)  4. Mendelsohn Maneuver - "half swallow" exercise - Start to swallow, and keep your Adam's apple up by squeezing hard with the muscles of the throat - Hold the squeeze for 5-7 seconds and then relax - Repeat 20 times, 2-3 times a day  Tongue Press - Press your entire tongue as hard as you can against the roof of your mouth for 3-5 seconds - Repeat 20 times, 2-3 times a day  Tongue Stretch/Teeth Clean - Move your tongue around the pocket between your gums and teeth, clockwise and then counter-clockwise - Repeat on the back side, clockwise and then counter-clockwise - Repeat 15-20 times, 2-3 times a day  Breath Hold - Say "HUH!" loudly, holding your breath tightly at the level of your voice box for 3 seconds - Repeat 20 times, 2-3 times a day  Chin pushback - Open your mouth  - Place your fist UNDER your chin near your neck, and push back with your fist for 5 seconds  - Repeat 20 times, 2-3 times a day   11. Stick out your tongue and say "GA-GA-GA" loud and shart           -Repeat 25 sets of 3, 2-3 times a day  12. Say ING-GA loud and exaggerated             - 25 times 2-3 times a day    Get the persons attention before you speak  Use eye contact and face the person you are speaking to  Be in close proximity to the person you are speaking to  Turn down any noise in the environment such as  the TV, walk away from loud appliances, air conditioners, fans, dish washers etc

## 2021-01-02 NOTE — Therapy (Signed)
[facility] [address] [place], [place], [address] Phone: [phone]   Fax:  [phone]  Speech Language Pathology Evaluation  Patient Details  Name: George Nielsen MRN: [id number] Date of Birth: 09-18-1955 Referring Provider (SLP): Dr. Sabino Dick   Encounter Date: 01/02/2021   End of Session - 01/02/21 1323     Visit Number 1    Number of Visits 25    Date for SLP Re-Evaluation 03/27/21    Authorization Type [facility] - 15 visits    Authorization - Visit Number 1    Authorization - Number of Visits 15    SLP Start Time 2353    SLP Stop Time  6144    SLP Time Calculation (min) 45 min             Past Medical History:  Diagnosis Date   Allergy    Anemia    Anxiety    Arthritis    Cancer ([place])    tongue and neck lymp nodes   COPD (chronic obstructive pulmonary disease) ([place])    Dyspnea    climbing stairs, walking distance   Emphysema of lung ([facility])    GERD (gastroesophageal reflux disease)    Hepatitis C    treated   History of radiation therapy 11/10/14- 12/29/14   BOT and bilateral neck/ 70 Gy in 35 fractions to gross disease, 63 Gy in 35 fractions to high risk nodal echelons, and 56 Gy in 35 fraction to intermediate risk nodal echelons.    Hypertension    Hypothyroidism    PNA (pneumonia)    4 times   Sleep apnea    wears CPAP sometimes per pt.   05/25/19 - not able to use at this time due to neck pain.   Thyroid disease    take Synthroid d/t scar tissue from radiation around thyroid   Ulcer     Past Surgical History:  Procedure Laterality Date   ANTERIOR CERVICAL DECOMP/DISCECTOMY FUSION N/A 05/27/2020   Procedure: Cervical Three-Four Anterior cervical decompression/discectomy/fusion;  Surgeon: Ashok Pall, MD;  Location: [facility];  Service: Neurosurgery;  Laterality: N/A;  anterior   BACK SURGERY     COLONOSCOPY WITH PROPOFOL N/A 01/06/2020   Procedure: COLONOSCOPY WITH PROPOFOL;  Surgeon:  Lavena Bullion, DO;  [facility];  Service: Gastroenterology;  Laterality: N/A;   ELBOW SURGERY Left    HEMOSTASIS CLIP PLACEMENT  01/06/2020   Procedure: HEMOSTASIS CLIP PLACEMENT;  Surgeon: Lavena Bullion, DO;  [facility];  Service: Gastroenterology;;   POLYPECTOMY  01/06/2020   Procedure: POLYPECTOMY;  Surgeon: Lavena Bullion, DO;  [facility];  Service: Gastroenterology;;   SHOULDER SURGERY     TONSILLECTOMY     tracheotomy     WRIST SURGERY     right    There were no vitals filed for this visit.   Subjective Assessment - 01/02/21 1151     Subjective "My speech is messing up"    Currently in Pain? No/denies                SLP Evaluation [facility] - 01/02/21 1151       SLP Visit Information   SLP Received On 01/02/21    Referring Provider (SLP) Dr. Sabino Dick    Onset Date 12/20/20 (MBSS)    Medical Diagnosis Dysphagia      General Information   HPI 65yo male referred for outpatient MBS due to trouble swallowing. Pt reports globus  sensation, cough/throat clear after meals, difficulty with pills, xerostomia, and gravelly voice following ACDF. Pt also indicates 35-36 radiation treatments and chemotherapy. PMH: throat cancer, ACDF, GERD, HTN, PNA (2006), PEG tube (removed). Know to [place] Docker  from prior course of ST 6 years ago, during XRT    Mobility Status walks independently      Balance Screen   Has the patient fallen in the past 6 months Yes    How many times? 1   fell backwards tripped over ladder   Has the patient had a decrease in activity level because of a fear of falling?  No    Is the patient reluctant to leave their home because of a fear of falling?  No      Prior Functional Status   Cognitive/Linguistic Baseline Baseline deficits    Baseline deficit details memory concerns    Type of Home House     Lives With Significant other    Available Support Family    Vocation Retired      Associate Professor   Overall Cognitive  Status Within Functional Limits for tasks assessed      Auditory Comprehension   Overall Auditory Comprehension Appears within functional limits for tasks assessed      Verbal Expression   Overall Verbal Expression Appears within functional limits for tasks assessed      Oral Motor/Sensory Function   Overall Oral Motor/Sensory Function Impaired at baseline    Labial ROM Within Functional Limits    Labial Symmetry Within Functional Limits    Labial Strength Within Functional Limits    Labial Sensation Reduced    Labial Coordination Reduced   with fatigue   Lingual ROM Within Functional Limits    Lingual Symmetry Within Functional Limits    Lingual Strength Within Functional Limits    Facial ROM Within Functional Limits    Velum Within Functional Limits      Motor Speech   Overall Motor Speech Impaired    Respiration Impaired    Level of Impairment Conversation    Phonation Wet    Resonance Within functional limits    Articulation Impaired    Level of Impairment Conversation    Intelligibility Intelligibility reduced    Word 75-100% accurate    Phrase 75-100% accurate    Sentence 75-100% accurate    Conversation 75-100% accurate    Motor Planning Witnin functional limits    Motor Speech Errors Inconsistent    Interfering Components Anatomical limitations   BOT fibrosis                            SLP Education - 01/02/21 1322     Education Details HEP for dysphagia initiated, diet modifications, swallow precautions    Person(s) Educated Patient    Methods Explanation;Demonstration;Verbal cues;Handout    Comprehension Returned demonstration;Verbal cues required;Need further instruction              SLP Short Term Goals - 01/02/21 1343       SLP SHORT TERM GOAL #1   Title pt will complete HEP for dysphagia with occasional min A    Time 6    Period Weeks    Status New      SLP SHORT TERM GOAL #2   Title Pt will follow swallow precautions  with occasional min A    Time 6    Period Weeks    Status New      SLP  SHORT TERM GOAL #3   Title pt will tell SLP 3 s/s aspiration PNA with rare min A    Time 6    Period Weeks    Status New      SLP SHORT TERM GOAL #4   Title Pt will follow diet modifications with occasional min A over 3 sessoins    Time 6    Period Weeks    Status New              SLP Long Term Goals - 01/02/21 1344       SLP LONG TERM GOAL #1   Title Pt will complete dysphagia HEP with modified independence over two sessions    Time 12    Period Weeks    Status New      SLP LONG TERM GOAL #2   Title Pt will follow swallow precautions with rare min A over 3 sessions    Time 12    Period Weeks    Status New      SLP LONG TERM GOAL #3   Title Pt will follow diet modifications with mod I over 2 sessions    Time [place] - 01/02/21 1324     Clinical Impression Statement George Bayle "[name]" is referred for outpt dysphagia therapy due to ongoing oral, pharyngeal and cp dysphagia. [name] reports he has had pna "about 6 times" and reports 20lb weight loss since 05/2020. He underwent ACDF in March 2022 and reports reduced cervical ROM since then, however he also has fibrosis as late effect of Bilateral neck and base of tongue XRT likely limiting ROM as well. Most recent MBSS 12/20/20 revealed moderate aspsiration risk. Moist soft solids and thin liqiuds were recommended with intermittent cough/throat clears, meds as tolerated. He is chewing several larger meds he can't swallow at this time. 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 Today, George Nielsen voice is consistently wet, before and after PO.PO trials of soft solid (cereal bar) and thin liquid reveal wet vocal quality, inconsistent immediate and delayed throat clearing, oral residue on hard palate, difficulty using lingual sweep to clear  oral residue and 2 episodes of immediate cough. Initially [name] did not verbalize or carryover swallow precautions. We reviewed these, adding hard swallow, throat clear, dry swallow and altenrating solids/liquids. With usual min cueing, [name] followed these precautions. Due to spondylosis, neck remains in flexion. [name] is fastidious about oral care after PO, which takes him 30 minutes. Due to extensive oral care routine, he is skipping lunch. Diet is limited to cereal with whole milk, V8 juice in the am, 2 Ensures a day and small dinner. He would like to eat lunch, but does not want to complete oral care three times a day. [name] reports reduced intelligibility throughout the day, were he is asked to repeat himself. After he used his tongue to clear oral residue, and after I trained in tongue press and tongue stretch, George Nielsen intelligilbity did reduce, indicating deconditioning. Inititated  training in HEP for dyshagia including effortful swallow, Masako, tongue press and tongue stretch/teeth clean. Will add to HEP during next visits. I recommend skilled ST to reduce risk of aspiration pna and maximize ease and efficiency of swallow and intelligibility.    Speech Therapy Frequency 2x / week    Duration 12 weeks  Treatment/Interventions Aspiration precaution training;Environmental controls;Cueing hierarchy;Pharyngeal strengthening exercises;Compensatory techniques;Functional tasks;Compensatory strategies;SLP instruction and feedback;Patient/family education;Multimodal communcation approach;Diet toleration management by SLP;Trials of upgraded texture/liquids;Internal/external aids;Other (comment)   repeat FEES or MBSS if indicated   Potential to Achieve Goals Fair    Potential Considerations Severity of impairments;Previous level of function;Co-morbidities             Patient will benefit from skilled therapeutic intervention in order to improve the following deficits and impairments:   Dysphagia,  oropharyngeal phase    Problem List Patient Active Problem List   Diagnosis Date Noted   HNP (herniated nucleus pulposus) with myelopathy, cervical 05/27/2020   Positive FIT (fecal immunochemical test)    Polyp of ascending colon    Adenomatous polyp of sigmoid colon    Diverticulosis of colon without hemorrhage    Grade II internal hemorrhoids    Malignant neoplasm of base of tongue ([place]) 10/27/2014   Chronic kidney disease    COPD (chronic obstructive pulmonary disease) ([place]) 07/10/2012   Chest pain 07/10/2012   HTN (hypertension) 07/10/2012   Hyponatremia 07/10/2012   TOBACCO USE 02/07/2010    Jondavid Schreier, Annye Rusk MS, CCC-SLP 01/02/2021, 1:46 PM  [facility] [address] [place], [place], [address] Phone: [phone]   Fax:  [phone]  Name: George Nielsen MRN: [id number] Date of Birth: 1956-01-31

## 2021-01-03 ENCOUNTER — Ambulatory Visit: Payer: No Typology Code available for payment source | Attending: Internal Medicine

## 2021-01-03 ENCOUNTER — Other Ambulatory Visit (HOSPITAL_BASED_OUTPATIENT_CLINIC_OR_DEPARTMENT_OTHER): Payer: Self-pay

## 2021-01-03 DIAGNOSIS — Z23 Encounter for immunization: Secondary | ICD-10-CM

## 2021-01-03 MED ORDER — PFIZER COVID-19 VAC BIVALENT 30 MCG/0.3ML IM SUSP
INTRAMUSCULAR | 0 refills | Status: DC
Start: 2021-01-03 — End: 2024-01-29
  Filled 2021-01-03: qty 0.3, 1d supply, fill #0

## 2021-01-03 NOTE — Progress Notes (Signed)
   Covid-19 Vaccination Clinic  Name:  George Nielsen    MRN: [id number] DOB: February 22, 1956  01/03/2021  George Nielsen was observed post Covid-19 immunization for 15 minutes without incident. He was provided with Vaccine Information Sheet and instruction to access the V-Safe system.   George Nielsen was instructed to call 911 with any severe reactions post vaccine: Difficulty breathing  Swelling of face and throat  A fast heartbeat  A bad rash all over body  Dizziness and weakness

## 2021-01-10 ENCOUNTER — Other Ambulatory Visit: Payer: Self-pay

## 2021-01-10 ENCOUNTER — Ambulatory Visit: Payer: No Typology Code available for payment source

## 2021-01-10 DIAGNOSIS — R1312 Dysphagia, oropharyngeal phase: Secondary | ICD-10-CM

## 2021-01-10 NOTE — Patient Instructions (Signed)
Additional copy of his HEP, additional copy of his precautions

## 2021-01-10 NOTE — Therapy (Signed)
[facility] [place], [place], [place], [address] Phone: [phone]   Fax:  [phone]  Speech Language Pathology Treatment  Patient Details  Name: George Nielsen MRN: [id number] Date of Birth: 11-Mar-1956 Referring Provider (SLP): Dr. Sabino Dick   Encounter Date: 01/10/2021   End of Session - 01/10/21 1233     Visit Number 2    Number of Visits 25    Date for SLP Re-Evaluation 03/27/21    Authorization Type VA - 15 visits    Authorization - Visit Number 2    Authorization - Number of Visits 15    SLP Start Time 9476    SLP Stop Time  1222    SLP Time Calculation (min) 37 min    Activity Tolerance Patient tolerated treatment well             Past Medical History:  Diagnosis Date   Allergy    Anemia    Anxiety    Arthritis    Cancer ([facility])    tongue and neck lymp nodes   COPD (chronic obstructive pulmonary disease) ([facility])    Dyspnea    climbing stairs, walking distance   Emphysema of lung ([facility])    GERD (gastroesophageal reflux disease)    Hepatitis C    treated   History of radiation therapy 11/10/14- 12/29/14   BOT and bilateral neck/ 70 Gy in 35 fractions to gross disease, 63 Gy in 35 fractions to high risk nodal echelons, and 56 Gy in 35 fraction to intermediate risk nodal echelons.    Hypertension    Hypothyroidism    PNA (pneumonia)    4 times   Sleep apnea    wears CPAP sometimes per pt.   05/25/19 - not able to use at this time due to neck pain.   Thyroid disease    take Synthroid d/t scar tissue from radiation around thyroid   Ulcer     Past Surgical History:  Procedure Laterality Date   ANTERIOR CERVICAL DECOMP/DISCECTOMY FUSION N/A 05/27/2020   Procedure: Cervical Three-Four Anterior cervical decompression/discectomy/fusion;  Surgeon: Ashok Pall, MD;  Location: [facility];  Service: Neurosurgery;  Laterality: N/A;  anterior   BACK SURGERY     COLONOSCOPY WITH PROPOFOL N/A 01/06/2020    Procedure: COLONOSCOPY WITH PROPOFOL;  Surgeon: Lavena Bullion, DO;  Location: [facility];  Service: Gastroenterology;  Laterality: N/A;   ELBOW SURGERY Left    HEMOSTASIS CLIP PLACEMENT  01/06/2020   Procedure: HEMOSTASIS CLIP PLACEMENT;  Surgeon: Lavena Bullion, DO;  Location: [facility];  Service: Gastroenterology;;   POLYPECTOMY  01/06/2020   Procedure: POLYPECTOMY;  Surgeon: Lavena Bullion, DO;  Location[facility];  Service: Gastroenterology;;   SHOULDER SURGERY     TONSILLECTOMY     tracheotomy     WRIST SURGERY     right    There were no vitals filed for this visit.   Subjective Assessment - 01/10/21 1148     Subjective I have neck drop now, and I've lost weight since March when I had the (ACDF) surgery."    Currently in Pain? No/denies                   ADULT SLP TREATMENT - 01/10/21 1152       General Information   Behavior/Cognition Alert;Cooperative;Pleasant mood      Treatment Provided   Treatment provided Dysphagia      Dysphagia Treatment  Temperature Spikes Noted No    Respiratory Status Room air    Treatment Methods Therapeutic exercise;Compensation strategy training;Patient/caregiver education;Skilled observation    Patient observed directly with PO's Yes    Type of PO's observed Thin liquids    Liquids provided via --   bottle   Pharyngeal Phase Signs & Symptoms Wet vocal quality    Type of cueing Visual    Amount of cueing --   usual, min-mod   Other treatment/comments Pt had tacos last night, this morning had cheerios and ensure with tomato juice. "I suppose if I had a sip after each bite I'd have right much water." SLP explained rationale for his precautions, and even if he doesn't need to cough after a swallow he should do so. He is not using precautions routinely with POs. SLP reviewed pt's HEP with him as well; he was unsure which exercises were on his HEP even though exercises were included in his HEP in his chart. He  req'd consistent min-mod A as pt stated he did not meet the recommended scope, frequency, or reps of the HEP. SLP reiterated pt must complete HEP at prescribed frequency and reps in order to have the best chance at improving swallow function. SLP told pt to complete # reps that he can of each exercise and then move on to next exercise and when he stays consistent he will be able to incr # reps over time. By session end, pt had completed effortful swallow, [place], tongue stretch, tingue press, vocal adduction, "ga-ga-ga". Pt will need "ING-GA" and mendelsohn added next session.      Assessment / Recommendations / Plan   Plan Continue with current plan of care;Other (Comment)   add ING-GA, and mendelsohn next session     Progression Toward Goals   Progression toward goals Progressing toward goals              SLP Education - 01/10/21 1232     Education Details HEP procedure, rationale for precautions and for HEP exercises    Person(s) Educated Patient    Methods Explanation;Demonstration;Verbal cues;Handout    Comprehension Verbalized understanding;Returned demonstration;Verbal cues required;Need further instruction              SLP Short Term Goals - 01/10/21 1241       SLP SHORT TERM GOAL #1   Title pt will complete HEP for dysphagia with occasional min A    Time 6    Period Weeks    Status On-going    Target Date 02/17/21      SLP SHORT TERM GOAL #2   Title Pt will follow swallow precautions with occasional min A    Time 6    Period Weeks    Status On-going    Target Date 02/17/21      SLP SHORT TERM GOAL #3   Title pt will tell SLP 3 s/s aspiration PNA with rare min A    Time 6    Period Weeks    Status On-going    Target Date 02/17/21      SLP SHORT TERM GOAL #4   Title Pt will follow diet modifications with occasional min A over 3 sessoins    Time 6    Period Weeks    Status On-going    Target Date 02/17/21              SLP Long Term Goals -  01/10/21 1242       SLP LONG  TERM GOAL #1   Title Pt will complete dysphagia HEP with modified independence over two sessions    Time 12    Period Weeks    Status On-going    Target Date 03/27/21      SLP LONG TERM GOAL #2   Title Pt will follow swallow precautions with rare min A over 3 sessions    Time 12    Period Weeks    Status On-going    Target Date 03/27/21      SLP LONG TERM GOAL #3   Title Pt will follow diet modifications with mod I over 2 sessions    Time 12    Period Weeks    Status On-going    Target Date 03/27/21              Plan - 01/10/21 1233     Clinical Impression Statement Madlyn Frankel "[name]" is referred for outpt dysphagia therapy due to ongoing oral, pharyngeal and cp dysphagia. [name] reports he has had pna "about 6 times" and reports 20lb weight loss since 05/2020. He underwent ACDF in March 2022 and reports reduced cervical ROM since then, however he also has fibrosis as late effect of Bilateral neck and base of tongue XRT likely limiting ROM as well. Most recent MBSS 12/20/20 revealed moderate aspsiration risk. Moist soft solids and thin liqiuds were recommended with intermittent cough/throat clears, meds as tolerated. He is chewing several larger meds he can't swallow at this time. 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 Today, Mike's voice continued as consistently wet, before and after PO.PO trials thin liquid reveal wet vocal quality, inconsistent immediate and delayed throat clearing. SLP added vocal adduction, chin push (CTAR), and "ga-ga-ga" exercises. I recommend skilled ST to reduce risk of aspiration pna and maximize ease and efficiency of swallow and intelligibility.    Speech Therapy Frequency 2x / week    Duration 12 weeks    Treatment/Interventions Aspiration precaution training;Environmental controls;Cueing hierarchy;Pharyngeal strengthening exercises;Compensatory  techniques;Functional tasks;Compensatory strategies;SLP instruction and feedback;Patient/family education;Multimodal communcation approach;Diet toleration management by SLP;Trials of upgraded texture/liquids;Internal/external aids;Other (comment)   repeat FEES or MBSS if indicated   Potential to Achieve Goals Fair    Potential Considerations Severity of impairments;Previous level of function;Co-morbidities             Patient will benefit from skilled therapeutic intervention in order to improve the following deficits and impairments:   Dysphagia, oropharyngeal phase    Problem List Patient Active Problem List   Diagnosis Date Noted   HNP (herniated nucleus pulposus) with myelopathy, cervical 05/27/2020   Positive FIT (fecal immunochemical test)    Polyp of ascending colon    Adenomatous polyp of sigmoid colon    Diverticulosis of colon without hemorrhage    Grade II internal hemorrhoids    Malignant neoplasm of base of tongue ([facility]) 10/27/2014   Chronic kidney disease    COPD (chronic obstructive pulmonary disease) ([facility]) 07/10/2012   Chest pain 07/10/2012   HTN (hypertension) 07/10/2012   Hyponatremia 07/10/2012   TOBACCO USE 02/07/2010    [place] ,[place], [place]  01/10/2021, 12:44 PM  [facility] [address], [place], [place], [address] Phone: [phone]   Fax:  [phone]   Name: AZAZEL FRANZE MRN: [id number] Date of Birth: June 30, 1955

## 2021-01-11 ENCOUNTER — Ambulatory Visit: Payer: No Typology Code available for payment source

## 2021-01-11 DIAGNOSIS — R1312 Dysphagia, oropharyngeal phase: Secondary | ICD-10-CM | POA: Diagnosis not present

## 2021-01-11 NOTE — Patient Instructions (Signed)
  We added one more exercise today (ING-GA, and addressed another we just talked about yesterday [facility]!!"). Do these as well, when you are completing your exercises.

## 2021-01-11 NOTE — Therapy (Signed)
[facility] [place], [place], [place], [address] Phone: [phone]   Fax:  [phone]  Speech Language Pathology Treatment  Patient Details  Name: George Nielsen MRN: [id number] Date of Birth: 1956-01-25 Referring Provider (SLP): Dr. Sabino Dick   Encounter Date: 01/11/2021   End of Session - 01/11/21 1442     Visit Number 3    Number of Visits 25    Date for SLP Re-Evaluation 03/27/21    Authorization Type VA - 15 visits    Authorization - Visit Number 3    Authorization - Number of Visits 15    SLP Start Time 7026    SLP Stop Time  1133    SLP Time Calculation (min) 31 min    Activity Tolerance Patient tolerated treatment well             Past Medical History:  Diagnosis Date   Allergy    Anemia    Anxiety    Arthritis    Cancer ([facility])    tongue and neck lymp nodes   COPD (chronic obstructive pulmonary disease) ([facility])    Dyspnea    climbing stairs, walking distance   Emphysema of lung ([facility])    GERD (gastroesophageal reflux disease)    Hepatitis C    treated   History of radiation therapy 11/10/14- 12/29/14   BOT and bilateral neck/ 70 Gy in 35 fractions to gross disease, 63 Gy in 35 fractions to high risk nodal echelons, and 56 Gy in 35 fraction to intermediate risk nodal echelons.    Hypertension    Hypothyroidism    PNA (pneumonia)    4 times   Sleep apnea    wears CPAP sometimes per pt.   05/25/19 - not able to use at this time due to neck pain.   Thyroid disease    take Synthroid d/t scar tissue from radiation around thyroid   Ulcer     Past Surgical History:  Procedure Laterality Date   ANTERIOR CERVICAL DECOMP/DISCECTOMY FUSION N/A 05/27/2020   Procedure: Cervical Three-Four Anterior cervical decompression/discectomy/fusion;  Surgeon: Ashok Pall, MD;  Location: [facility];  Service: Neurosurgery;  Laterality: N/A;  anterior   BACK SURGERY     COLONOSCOPY WITH PROPOFOL N/A 01/06/2020    Procedure: COLONOSCOPY WITH PROPOFOL;  Surgeon: Lavena Bullion, DO;  [facility];  Service: Gastroenterology;  Laterality: N/A;   ELBOW SURGERY Left    HEMOSTASIS CLIP PLACEMENT  01/06/2020   Procedure: HEMOSTASIS CLIP PLACEMENT;  Surgeon: Lavena Bullion, DO;  [facility];  Service: Gastroenterology;;   POLYPECTOMY  01/06/2020   Procedure: POLYPECTOMY;  Surgeon: Lavena Bullion, DO;  [facility];  Service: Gastroenterology;;   SHOULDER SURGERY     TONSILLECTOMY     tracheotomy     WRIST SURGERY     right    There were no vitals filed for this visit.   Subjective Assessment - 01/11/21 1109     Subjective "My tongue was hurting a little bit with those ga-ga-ga's."    Currently in Pain? No/denies   just some discomfort                  ADULT SLP TREATMENT - 01/11/21 1412       General Information   Behavior/Cognition Alert;Cooperative;Pleasant mood      Treatment Provided   Treatment provided Dysphagia      Dysphagia Treatment   Temperature Spikes Noted  No    Respiratory Status Room air    Treatment Methods Therapeutic exercise;Patient/caregiver education    Type of PO's observed Thin liquids   <1/3 - 1/2 teaspoons with HEP   Liquids provided via --   bottle   Pharyngeal Phase Signs & Symptoms --   none today   Other treatment/comments SLP again stressed soft mushier foods for POs. Reviewed pt's HEP and pt req'd occasional min A for established exercises and SLP educated pt on new exercise "ing - GA" and reviewed vocal adduction which pt stated he was not familiar with yesterday. SLP told pt if he wets his tongue with water for the HEP he does not have to cough and reswallow but if he takes a sip he must cough and reswallow. SLP provided pt with overt s/sx aspiration PNA today; pt told SLP 3 s/sx.      Assessment / Recommendations / Plan   Plan Continue with current plan of care      Progression Toward Goals   Progression  toward goals Progressing toward goals              SLP Education - 01/11/21 1442     Education Details ING-GA and vocal adduction exercises, overt s/sx aspiration PNA    Person(s) Educated Patient    Methods Explanation;Demonstration;Verbal cues    Comprehension Verbalized understanding;Returned demonstration;Verbal cues required;Need further instruction              SLP Short Term Goals - 01/11/21 1446       SLP SHORT TERM GOAL #1   Title pt will complete HEP for dysphagia with occasional min A    Time 6    Period Weeks    Status On-going    Target Date 02/17/21      SLP SHORT TERM GOAL #2   Title Pt will follow swallow precautions with occasional min A    Time 6    Period Weeks    Status On-going    Target Date 02/17/21      SLP SHORT TERM GOAL #3   Title pt will tell SLP 3 s/s aspiration PNA with rare min A    Status Achieved    Target Date 02/17/21      SLP SHORT TERM GOAL #4   Title Pt will follow diet modifications with occasional min A over 3 sessoins    Time 6    Period Weeks    Status On-going    Target Date 02/17/21              SLP Long Term Goals - 01/11/21 1447       SLP LONG TERM GOAL #1   Title Pt will complete dysphagia HEP with modified independence over two sessions    Time 12    Period Weeks    Status On-going    Target Date 03/27/21      SLP LONG TERM GOAL #2   Title Pt will follow swallow precautions with rare min A over 3 sessions    Time 12    Period Weeks    Status On-going    Target Date 03/27/21      SLP LONG TERM GOAL #3   Title Pt will follow diet modifications with mod I over 2 sessions    Time 12    Period Weeks    Status On-going    Target Date 03/27/21              Plan -  01/11/21 1443     Clinical Impression Statement George Nielsen "George Nielsen" is referred for outpt dysphagia therapy due to ongoing oral, pharyngeal and cp dysphagia. George Nielsen reports he has had pna "about 6 times" and reports 20lb weight  loss since 05/2020, of which he states he has gained 5 lbs back in last ~8 weeks. He underwent ACDF in March 2022 and reports reduced cervical ROM since then, however he also has fibrosis as late effect of Bilateral neck and base of tongue XRT likely limiting ROM as well. Most recent MBSS 12/20/20 revealed moderate aspsiration risk. Moist soft solids and thin liqiuds were recommended with intermittent cough/throat clears, meds as tolerated. He is chewing several larger meds he can't swallow at this time. 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 SLP added "ING-GA" and addressed vocal adduction today. See "skilled intervention" for more details.  I recommend cont'd skilled ST to reduce risk of aspiration pna and maximize ease and efficiency of swallow and intelligibility.    Speech Therapy Frequency 2x / week    Duration 12 weeks    Treatment/Interventions Aspiration precaution training;Environmental controls;Cueing hierarchy;Pharyngeal strengthening exercises;Compensatory techniques;Functional tasks;Compensatory strategies;SLP instruction and feedback;Patient/family education;Multimodal communcation approach;Diet toleration management by SLP;Trials of upgraded texture/liquids;Internal/external aids;Other (comment)   repeat FEES or MBSS if indicated   Potential to Achieve Goals Fair    Potential Considerations Severity of impairments;Previous level of function;Co-morbidities             Patient will benefit from skilled therapeutic intervention in order to improve the following deficits and impairments:   Dysphagia, oropharyngeal phase    Problem List Patient Active Problem List   Diagnosis Date Noted   HNP (herniated nucleus pulposus) with myelopathy, cervical 05/27/2020   Positive FIT (fecal immunochemical test)    Polyp of ascending colon    Adenomatous polyp of sigmoid colon    Diverticulosis of colon without hemorrhage     Grade II internal hemorrhoids    Malignant neoplasm of base of tongue ([facility]) 10/27/2014   Chronic kidney disease    COPD (chronic obstructive pulmonary disease) ([facility]) 07/10/2012   Chest pain 07/10/2012   HTN (hypertension) 07/10/2012   Hyponatremia 07/10/2012   TOBACCO USE 02/07/2010    [place] ,[place], [place]  01/11/2021, 2:48 PM  [facility] [address], [place], [place], [address] Phone: [phone]   Fax:  [phone]   Name: George Nielsen MRN: [id number] Date of Birth: 1955/05/17

## 2021-01-17 ENCOUNTER — Other Ambulatory Visit: Payer: Self-pay

## 2021-01-17 ENCOUNTER — Ambulatory Visit: Payer: No Typology Code available for payment source

## 2021-01-17 DIAGNOSIS — R1312 Dysphagia, oropharyngeal phase: Secondary | ICD-10-CM | POA: Diagnosis not present

## 2021-01-17 NOTE — Therapy (Signed)
[facility] [place], [place], [place], [address] Phone: [phone]   Fax:  [phone]  Speech Language Pathology Treatment  Patient Details  Name: George Nielsen MRN: [id number] Date of Birth: Feb 24, 1956 Referring Provider (SLP): George Nielsen   Encounter Date: 01/17/2021   End of Session - 01/17/21 1728     Visit Number 4    Number of Visits 25    Date for SLP Re-Evaluation 03/27/21    Authorization Type VA - 15 visits    Authorization - Visit Number 4    Authorization - Number of Visits 15    SLP Start Time 1157   10 minutes late   SLP Stop Time  3149    SLP Time Calculation (min) 38 min    Activity Tolerance Patient tolerated treatment well             Past Medical History:  Diagnosis Date   Allergy    Anemia    Anxiety    Arthritis    Cancer ([place])    tongue and neck lymp nodes   COPD (chronic obstructive pulmonary disease) ([place])    Dyspnea    climbing stairs, walking distance   Emphysema of lung ([facility])    GERD (gastroesophageal reflux disease)    Hepatitis C    treated   History of radiation therapy 11/10/14- 12/29/14   BOT and bilateral neck/ 70 Gy in 35 fractions to gross disease, 63 Gy in 35 fractions to high risk nodal echelons, and 56 Gy in 35 fraction to intermediate risk nodal echelons.    Hypertension    Hypothyroidism    PNA (pneumonia)    4 times   Sleep apnea    wears CPAP sometimes per pt.   05/25/19 - not able to use at this time due to neck pain.   Thyroid disease    take Synthroid d/t scar tissue from radiation around thyroid   Ulcer     Past Surgical History:  Procedure Laterality Date   ANTERIOR CERVICAL DECOMP/DISCECTOMY FUSION N/A 05/27/2020   Procedure: Cervical Three-Four Anterior cervical decompression/discectomy/fusion;  Surgeon: George Pall, MD;  Location: [facility];  Service: Neurosurgery;  Laterality: N/A;  anterior   BACK SURGERY     COLONOSCOPY WITH PROPOFOL N/A  01/06/2020   Procedure: COLONOSCOPY WITH PROPOFOL;  Surgeon: George Bullion, DO;  Location[facility];  Service: Gastroenterology;  Laterality: N/A;   ELBOW SURGERY Left    HEMOSTASIS CLIP PLACEMENT  01/06/2020   Procedure: HEMOSTASIS CLIP PLACEMENT;  Surgeon: George Bullion, DO;  Location: [facility];  Service: Gastroenterology;;   POLYPECTOMY  01/06/2020   Procedure: POLYPECTOMY;  Surgeon: George Bullion, DO;  Location: [facility];  Service: Gastroenterology;;   SHOULDER SURGERY     TONSILLECTOMY     tracheotomy     WRIST SURGERY     right    There were no vitals filed for this visit.   Subjective Assessment - 01/17/21 1202     Subjective "Sometimes my tongue feels like it's going to just start to cramp up. I just stop." (pt states this occurs once/day wth HEP)    Currently in Pain? Yes    Pain Score 2     Pain Location Shoulder    Pain Orientation Right;Left    Pain Descriptors / Indicators Discomfort    Pain Type Chronic pain    Pain Onset More than a month ago    Pain  Frequency Constant                   ADULT SLP TREATMENT - 01/17/21 1203       General Information   Behavior/Cognition Alert;Cooperative;Pleasant mood      Treatment Provided   Treatment provided Dysphagia      Dysphagia Treatment   Temperature Spikes Noted No    Respiratory Status Room air    Treatment Methods Therapeutic exercise;Patient/caregiver education    Patient observed directly with PO's Yes    Type of PO's observed Dysphagia 3 (soft);Thin liquids    Liquids provided via --   bottle   Pharyngeal Phase Signs & Symptoms --   none noted today with water during HEP, or cereal bar   Other treatment/comments George Nielsen forgot food from home so SLP provided cereal bar. He reports that he is eating the same things he has always eaten - still drinking Ensures and has not lost any weight. cont no overt s/sx aspiration PNA. He req'd one cue initially for precautions with POs "You  know I don't do those 100% of the time at home- I can't remember every time," pt stated. SLP encouraged pt to adhere as closely as possible as these precautions will keep him from any aspiration. Req'd SBA with HEP today, hold time on CTAR (chin-fist pushback). Told pt to complete HEP reps 2 reps past when he feels tired/fatigue, up to the recommended amount.      Assessment / Recommendations / Plan   Plan Continue with current plan of care      Progression Toward Goals   Progression toward goals Progressing toward goals                SLP Short Term Goals - 01/17/21 1730       SLP SHORT TERM GOAL #1   Title pt will complete HEP for dysphagia with occasional min A    Baseline 01-17-21    Time 6    Period Weeks    Status On-going    Target Date 02/17/21      SLP SHORT TERM GOAL #2   Title Pt will follow swallow precautions with occasional min A    Time 6    Period Weeks    Status On-going    Target Date 02/17/21      SLP SHORT TERM GOAL #3   Title pt will tell SLP 3 s/s aspiration PNA with rare min A    Status Achieved    Target Date 02/17/21      SLP SHORT TERM GOAL #4   Title Pt will follow diet modifications with occasional min A over 3 sessoins    Baseline 01-17-21    Time 6    Period Weeks    Status On-going    Target Date 02/17/21              SLP Long Term Goals - 01/17/21 1732       SLP LONG TERM GOAL #1   Title Pt will complete dysphagia HEP with modified independence over two sessions    Time 12    Period Weeks    Status On-going    Target Date 03/27/21      SLP LONG TERM GOAL #2   Title Pt will follow swallow precautions with rare min A over 3 sessions    Time 12    Period Weeks    Status On-going    Target Date 03/27/21  SLP LONG TERM GOAL #3   Title Pt will follow diet modifications with mod I over 2 sessions    Time 12    Period Weeks    Status On-going    Target Date 03/27/21              Plan - 01/17/21 1728      Clinical Impression Statement George Nielsen "George Nielsen" is referred for outpt dysphagia therapy due to ongoing oral, pharyngeal and cp dysphagia. George Nielsen reports he has had pna "about 6 times" and reports 20lb weight loss since 05/2020, of which he states he has gained 5 lbs back in last ~8 weeks. He underwent ACDF in March 2022 and reports reduced cervical ROM since then, however he also has fibrosis as late effect of Bilateral neck and base of tongue XRT likely limiting ROM as well. Most recent MBSS 12/20/20 revealed moderate aspsiration risk. Moist soft solids and thin liqiuds were recommended with intermittent cough/throat clears, meds as tolerated. He is chewing several larger meds he can't swallow at this time. 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 Pt told SLP today he feels his swallow is getting stronger, as he is able to complete the HEP in a shorter amount of time than two weeks ago. See "skilled intervention" for more details.  I recommend cont'd skilled ST to reduce risk of aspiration pna and maximize ease and efficiency of swallow and intelligibility.    Speech Therapy Frequency 2x / week    Duration 12 weeks    Treatment/Interventions Aspiration precaution training;Environmental controls;Cueing hierarchy;Pharyngeal strengthening exercises;Compensatory techniques;Functional tasks;Compensatory strategies;SLP instruction and feedback;Patient/family education;Multimodal communcation approach;Diet toleration management by SLP;Trials of upgraded texture/liquids;Internal/external aids;Other (comment)   repeat FEES or MBSS if indicated   Potential to Achieve Goals Fair    Potential Considerations Severity of impairments;Previous level of function;Co-morbidities             Patient will benefit from skilled therapeutic intervention in order to improve the following deficits and impairments:   Dysphagia, oropharyngeal  phase    Problem List Patient Active Problem List   Diagnosis Date Noted   HNP (herniated nucleus pulposus) with myelopathy, cervical 05/27/2020   Positive FIT (fecal immunochemical test)    Polyp of ascending colon    Adenomatous polyp of sigmoid colon    Diverticulosis of colon without hemorrhage    Grade II internal hemorrhoids    Malignant neoplasm of base of tongue ([place]) 10/27/2014   Chronic kidney disease    COPD (chronic obstructive pulmonary disease) ([place]) 07/10/2012   Chest pain 07/10/2012   HTN (hypertension) 07/10/2012   Hyponatremia 07/10/2012   TOBACCO USE 02/07/2010    [place] ,[place], [place]  01/17/2021, 5:32 PM  [facility] [address] [place], [place], [address] Phone: [phone]   Fax:  [phone]   Name: George Nielsen MRN: [id number] Date of Birth: [birth year]

## 2021-01-19 ENCOUNTER — Other Ambulatory Visit: Payer: Self-pay

## 2021-01-19 ENCOUNTER — Ambulatory Visit: Payer: No Typology Code available for payment source

## 2021-01-19 DIAGNOSIS — R1312 Dysphagia, oropharyngeal phase: Secondary | ICD-10-CM | POA: Diagnosis not present

## 2021-01-19 NOTE — Therapy (Signed)
[facility] [place], [place], [place], [address] Phone: [phone]   Fax:  [phone]  Speech Language Pathology Treatment  Patient Details  Name: George Nielsen MRN: [id number] Date of Birth: 06/08/1955 Referring Provider (SLP): Dr. Sabino Dick   Encounter Date: 01/19/2021   End of Session - 01/19/21 1327     Visit Number 5    Number of Visits 25    Date for SLP Re-Evaluation 03/27/21    Authorization Type VA - 15 visits    Authorization - Visit Number 5    Authorization - Number of Visits 15    SLP Start Time 5329    SLP Stop Time  1230    SLP Time Calculation (min) 37 min    Activity Tolerance Patient tolerated treatment well             Past Medical History:  Diagnosis Date   Allergy    Anemia    Anxiety    Arthritis    Cancer ([place])    tongue and neck lymp nodes   COPD (chronic obstructive pulmonary disease) ([facility])    Dyspnea    climbing stairs, walking distance   Emphysema of lung ([facility])    GERD (gastroesophageal reflux disease)    Hepatitis C    treated   History of radiation therapy 11/10/14- 12/29/14   BOT and bilateral neck/ 70 Gy in 35 fractions to gross disease, 63 Gy in 35 fractions to high risk nodal echelons, and 56 Gy in 35 fraction to intermediate risk nodal echelons.    Hypertension    Hypothyroidism    PNA (pneumonia)    4 times   Sleep apnea    wears CPAP sometimes per pt.   05/25/19 - not able to use at this time due to neck pain.   Thyroid disease    take Synthroid d/t scar tissue from radiation around thyroid   Ulcer     Past Surgical History:  Procedure Laterality Date   ANTERIOR CERVICAL DECOMP/DISCECTOMY FUSION N/A 05/27/2020   Procedure: Cervical Three-Four Anterior cervical decompression/discectomy/fusion;  Surgeon: Ashok Pall, MD;  Location: [facility];  Service: Neurosurgery;  Laterality: N/A;  anterior   BACK SURGERY     COLONOSCOPY WITH PROPOFOL N/A 01/06/2020    Procedure: COLONOSCOPY WITH PROPOFOL;  Surgeon: Lavena Bullion, DO;  [facility];  Service: Gastroenterology;  Laterality: N/A;   ELBOW SURGERY Left    HEMOSTASIS CLIP PLACEMENT  01/06/2020   Procedure: HEMOSTASIS CLIP PLACEMENT;  Surgeon: Lavena Bullion, DO;  [facility];  Service: Gastroenterology;;   POLYPECTOMY  01/06/2020   Procedure: POLYPECTOMY;  Surgeon: Lavena Bullion, DO;  [facility];  Service: Gastroenterology;;   SHOULDER SURGERY     TONSILLECTOMY     tracheotomy     WRIST SURGERY     right    There were no vitals filed for this visit.   Subjective Assessment - 01/19/21 1315     Subjective "I forgot my cheat sheet, Glendell Docker."    Currently in Pain? Yes    Pain Score 2     Pain Location Shoulder    Pain Orientation Right;Left    Pain Descriptors / Indicators Discomfort    Pain Type Chronic pain                   ADULT SLP TREATMENT - 01/19/21 1207       General Information   Behavior/Cognition  Alert;Cooperative;Pleasant mood      Treatment Provided   Treatment provided Dysphagia      Dysphagia Treatment   Temperature Spikes Noted No    Respiratory Status Room air    Treatment Methods Therapeutic exercise;Patient/caregiver education    Patient observed directly with PO's Yes    Type of PO's observed Dysphagia 3 (soft);Thin liquids    Liquids provided via --   bottle   Pharyngeal Phase Signs & Symptoms Immediate throat clear    Other treatment/comments Pt did not bring food from home today and did not bring exercises. Pt had tacos last night, which is not in his recommended diet. SLP provided pt with a list of exercises - before two exercises pt needed to read the instructions for the exercise, but completed all exercises with modified independence. SLP strongly reiterated pt complete HEP BID and the number of reps suggested. POs included cereal bar and water. SLP asked pt to bring in solid PO next session to bring  some variety to PO observation - pt was reticient to bring anything from home but said he would ask his wife. With POs pt used precautions with initial min A faded to independent - SLP suspects pt is not routinely using these precautions at home and has used them enough in clinic to know how to do so in this environment.      Assessment / Recommendations / Plan   Plan --   reducing to once a week due to accuracy with hep and with precautions     Progression Toward Goals   Progression toward goals Progressing toward goals                SLP Short Term Goals - 01/19/21 1355       SLP SHORT TERM GOAL #1   Title pt will complete HEP for dysphagia with occasional min A    Baseline 01-17-21    Status Achieved    Target Date 02/17/21      SLP SHORT TERM GOAL #2   Title Pt will follow swallow precautions with occasional min A    Baseline 01-19-21    Status Achieved    Target Date 02/17/21      SLP SHORT TERM GOAL #3   Title pt will tell SLP 3 s/s aspiration PNA with rare min A    Status Achieved    Target Date 02/17/21      SLP SHORT TERM GOAL #4   Title Pt will follow diet modifications with occasional min A over 3 sessoins    Baseline 01-17-21    Time 6    Period Weeks    Status On-going    Target Date 02/17/21              SLP Long Term Goals - 01/19/21 1400       SLP LONG TERM GOAL #1   Title Pt will complete dysphagia HEP with modified independence over three sessions    Time 12    Period Weeks    Status Revised      SLP LONG TERM GOAL #2   Title Pt will follow swallow precautions with rare min A over 3 sessions    Baseline 01-19-21    Time 12    Period Weeks    Status On-going      SLP LONG TERM GOAL #3   Title Pt will follow diet modifications with mod I over 2 sessions    Time 12  Period Weeks    Status On-going              Plan - 01/19/21 1327     Clinical Impression Statement [name] "George Nielsen" is referred for outpt dysphagia  therapy due to ongoing oral, pharyngeal and cp dysphagia. George Nielsen reports he has had pna "about 6 times" and reports 20lb weight loss since 05/2020, of which he states he has gained 5 lbs back in last ~8 weeks. He underwent ACDF in March 2022 and reports reduced cervical ROM since then, however he also has fibrosis as late effect of Bilateral neck and base of tongue XRT likely limiting ROM as well. Most recent MBSS 12/20/20 revealed moderate aspsiration risk. Moist soft solids and thin liqiuds were recommended with intermittent cough/throat clears, meds as tolerated. He is chewing several larger meds he can't swallow at this time. 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 Pt told SLP today he feels his swallow is getting stronger, as he is able to complete the HEP in a shorter amount of time than two weeks ago. See "skilled intervention" for more details. Pt reduced to once/wee kdue to progress.  I recommend cont'd skilled ST to reduce risk of aspiration pna and maximize ease and efficiency of swallow and intelligibility.    Speech Therapy Frequency 1x /week    Duration 12 weeks    Treatment/Interventions Aspiration precaution training;Environmental controls;Cueing hierarchy;Pharyngeal strengthening exercises;Compensatory techniques;Functional tasks;Compensatory strategies;SLP instruction and feedback;Patient/family education;Multimodal communcation approach;Diet toleration management by SLP;Trials of upgraded texture/liquids;Internal/external aids;Other (comment)   repeat FEES or MBSS if indicated   Potential to Achieve Goals Fair    Potential Considerations Severity of impairments;Previous level of function;Co-morbidities             Patient will benefit from skilled therapeutic intervention in order to improve the following deficits and impairments:   Dysphagia, oropharyngeal phase    Problem List Patient Active Problem List    Diagnosis Date Noted   HNP (herniated nucleus pulposus) with myelopathy, cervical 05/27/2020   Positive FIT (fecal immunochemical test)    Polyp of ascending colon    Adenomatous polyp of sigmoid colon    Diverticulosis of colon without hemorrhage    Grade II internal hemorrhoids    Malignant neoplasm of base of tongue ([place]) 10/27/2014   Chronic kidney disease    COPD (chronic obstructive pulmonary disease) ([place]) 07/10/2012   Chest pain 07/10/2012   HTN (hypertension) 07/10/2012   Hyponatremia 07/10/2012   TOBACCO USE 02/07/2010    [place] ,[place], [place]  01/19/2021, 2:01 PM  [facility] [address], [place], [place], [address] Phone: [phone]   Fax:  [phone]   Name: ZOHAIB HEENEY MRN: [id number] Date of Birth: 09/12/1955

## 2021-01-24 ENCOUNTER — Other Ambulatory Visit: Payer: Self-pay

## 2021-01-24 ENCOUNTER — Ambulatory Visit: Payer: No Typology Code available for payment source | Attending: Neurosurgery

## 2021-01-24 DIAGNOSIS — R1312 Dysphagia, oropharyngeal phase: Secondary | ICD-10-CM | POA: Diagnosis not present

## 2021-01-24 NOTE — Therapy (Signed)
[facility] [address], [place], [place], [address] Phone: [phone]   Fax:  [phone]  Speech Language Pathology Treatment  Patient Details  Name: George Nielsen MRN: [id number] Date of Birth: Apr 28, 1955 Referring Provider (SLP): Dr. Sabino Dick   Encounter Date: 01/24/2021   End of Session - 01/24/21 1338     Visit Number 6    Number of Visits 25    Date for SLP Re-Evaluation 03/27/21    Authorization Type VA - 15 visits    Authorization - Visit Number 6    Authorization - Number of Visits 15    SLP Start Time 0938    SLP Stop Time  1227    SLP Time Calculation (min) 35 min    Activity Tolerance Patient tolerated treatment well             Past Medical History:  Diagnosis Date   Allergy    Anemia    Anxiety    Arthritis    Cancer ([place])    tongue and neck lymp nodes   COPD (chronic obstructive pulmonary disease) ([place])    Dyspnea    climbing stairs, walking distance   Emphysema of lung ([facility])    GERD (gastroesophageal reflux disease)    Hepatitis C    treated   History of radiation therapy 11/10/14- 12/29/14   BOT and bilateral neck/ 70 Gy in 35 fractions to gross disease, 63 Gy in 35 fractions to high risk nodal echelons, and 56 Gy in 35 fraction to intermediate risk nodal echelons.    Hypertension    Hypothyroidism    PNA (pneumonia)    4 times   Sleep apnea    wears CPAP sometimes per pt.   05/25/19 - not able to use at this time due to neck pain.   Thyroid disease    take Synthroid d/t scar tissue from radiation around thyroid   Ulcer     Past Surgical History:  Procedure Laterality Date   ANTERIOR CERVICAL DECOMP/DISCECTOMY FUSION N/A 05/27/2020   Procedure: Cervical Three-Four Anterior cervical decompression/discectomy/fusion;  Surgeon: Ashok Pall, MD;  Location: [facility];  Service: Neurosurgery;  Laterality: N/A;  anterior   BACK SURGERY     COLONOSCOPY WITH PROPOFOL N/A 01/06/2020    Procedure: COLONOSCOPY WITH PROPOFOL;  Surgeon: Lavena Bullion, DO;  [facility];  Service: Gastroenterology;  Laterality: N/A;   ELBOW SURGERY Left    HEMOSTASIS CLIP PLACEMENT  01/06/2020   Procedure: HEMOSTASIS CLIP PLACEMENT;  Surgeon: Lavena Bullion, DO;  Location[facility];  Service: Gastroenterology;;   POLYPECTOMY  01/06/2020   Procedure: POLYPECTOMY;  Surgeon: Lavena Bullion, DO;  Location[facility];  Service: Gastroenterology;;   SHOULDER SURGERY     TONSILLECTOMY     tracheotomy     WRIST SURGERY     right    There were no vitals filed for this visit.   Subjective Assessment - 01/24/21 1155     Subjective Tacos (hard) last night.    Currently in Pain? Yes    Pain Score 2     Pain Location Neck    Pain Orientation Left    Pain Descriptors / Indicators Discomfort    Pain Type Chronic pain    Pain Radiating Towards lt shoulder    Pain Onset More than a month ago    Pain Frequency Constant  ADULT SLP TREATMENT - 01/24/21 1157       General Information   Behavior/Cognition Alert;Cooperative;Pleasant mood      Treatment Provided   Treatment provided Dysphagia      Dysphagia Treatment   Temperature Spikes Noted No    Respiratory Status Room air    Treatment Methods Therapeutic exercise;Patient/caregiver education    Patient observed directly with PO's Yes    Type of PO's observed Dysphagia 1 (puree);Thin liquids    Liquids provided via --   bottle   Oral Phase Signs & Symptoms Prolonged bolus formation   ? this due to applesauce   Pharyngeal Phase Signs & Symptoms Immediate throat clear    Other treatment/comments Did not bring food from home today. Mina Marble it I asked Val what I could have.") Pt req'd min-mod A occasionally for following precautions with applesauce and water, faded to occasional min A. It does not appear pt is following recommendations at home due to pt eating hard shell tacos last night. SLP  reiterated soft diet recommendation to pt and he stated, "Well I just got sick of soft shells so I started eating hard shells." Pt states no difficulty with clearance, but based on results of MBSS SLP suspects difficulty occurred. No overt s/sx aspiration PNA today however with suspected non-adherence to swallow precautions at home despite cont'd work with them during sessions SLP provided pt overt s/sx aspiration PNA. Given history of pt's visits thus far, expected that pt is going to choose which precautions to adhere to and which to ignore. Pt was modified independent with HEP and was reminded that he will need to perform recommended reps and frequency in order to give best opportunity for improvement in swallow function.      Assessment / Recommendations / Plan   Plan --   likely decr to once every other week if pt progress unchanged with HEP next session.     Progression Toward Goals   Progression toward goals --   pt likely not following precautions at home             SLP Education - 01/24/21 1337     Education Details needs to follow recommended frequency and scope of HEP for best possible chance for improvement, pt was recommended SOFT food diet which hard shells are not part of    Person(s) Educated Patient    Methods Explanation    Comprehension Verbalized understanding              SLP Short Term Goals - 01/19/21 1355       SLP SHORT TERM GOAL #1   Title pt will complete HEP for dysphagia with occasional min A    Baseline 01-17-21    Status Achieved    Target Date 02/17/21      SLP SHORT TERM GOAL #2   Title Pt will follow swallow precautions with occasional min A    Baseline 01-19-21    Status Achieved    Target Date 02/17/21      SLP SHORT TERM GOAL #3   Title pt will tell SLP 3 s/s aspiration PNA with rare min A    Status Achieved    Target Date 02/17/21      SLP SHORT TERM GOAL #4   Title Pt will follow diet modifications with occasional min A over 3  sessoins    Baseline 01-17-21    Time 6    Period Weeks    Status On-going  Target Date 02/17/21              SLP Long Term Goals - 01/24/21 1339       SLP LONG TERM GOAL #1   Title Pt will complete dysphagia HEP with modified independence over three sessions    Baseline 01-24-21    Time 12    Period Weeks    Status Revised    Target Date 03/27/21      SLP LONG TERM GOAL #2   Title Pt will follow swallow precautions with rare min A over 3 sessions    Baseline 01-19-21    Time 12    Period Weeks    Status On-going    Target Date 03/27/21      SLP LONG TERM GOAL #3   Title Pt will follow diet modifications with mod I over 2 sessions    Time 12    Period Weeks    Status On-going    Target Date 03/27/21              Plan - 01/24/21 1338     Clinical Impression Statement George Frankel "George Nielsen" is referred for outpt dysphagia therapy due to ongoing oral, pharyngeal and cp dysphagia. George Nielsen reports he has had pna "about 6 times" and reports 20lb weight loss since 05/2020, of which he states he has gained 5 lbs back in last ~8 weeks. He underwent ACDF in March 2022 and reports reduced cervical ROM since then, however he also has fibrosis as late effect of Bilateral neck and base of tongue XRT likely limiting ROM as well. Most recent MBSS 12/20/20 revealed moderate aspsiration risk. Moist soft solids and thin liqiuds were recommended with intermittent cough/throat clears, meds as tolerated. He is chewing several larger meds he can't swallow at this time. 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 Pt told SLP today he feels his swallow is getting stronger, as he is able to complete the HEP in a shorter amount of time than two weeks ago. See "skilled intervention" for more details. Based upon SLP observations in sessions, likely that pt is not following scope of diet and swallowing precautions recommended at  [facility]. I recommend cont'd skilled ST to reduce risk of aspiration pna and maximize ease and efficiency of swallow and intelligibility.    Speech Therapy Frequency 1x /week    Duration 12 weeks    Treatment/Interventions Aspiration precaution training;Environmental controls;Cueing hierarchy;Pharyngeal strengthening exercises;Compensatory techniques;Functional tasks;Compensatory strategies;SLP instruction and feedback;Patient/family education;Multimodal communcation approach;Diet toleration management by SLP;Trials of upgraded texture/liquids;Internal/external aids;Other (comment)   repeat Nielsen or MBSS if indicated   Potential to Achieve Goals Fair    Potential Considerations Severity of impairments;Previous level of function;Co-morbidities             Patient will benefit from skilled therapeutic intervention in order to improve the following deficits and impairments:   Dysphagia, oropharyngeal phase    Problem List Patient Active Problem List   Diagnosis Date Noted   HNP (herniated nucleus pulposus) with myelopathy, cervical 05/27/2020   Positive FIT (fecal immunochemical test)    Polyp of ascending colon    Adenomatous polyp of sigmoid colon    Diverticulosis of colon without hemorrhage    Grade II internal hemorrhoids    Malignant neoplasm of base of tongue ([place]) 10/27/2014   Chronic kidney disease    COPD (chronic obstructive pulmonary disease) ([place]) 07/10/2012   Chest pain 07/10/2012   HTN (  hypertension) 07/10/2012   Hyponatremia 07/10/2012   TOBACCO USE 02/07/2010    Angle Karel ,MS, CCC-SLP  01/24/2021, 1:40 PM  [facility] [address] [place], [place], [address] Phone: [phone]   Fax:  [phone]   Name: George Nielsen MRN: [id number] Date of Birth: [birth year]

## 2021-01-24 NOTE — Patient Instructions (Signed)
   Signs of Aspiration Pneumonia   Chest pain/tightness Fever (can be low grade) Cough  With foul-smelling phlegm (sputum) With sputum containing pus or blood With greenish sputum Fatigue  Shortness of breath  Wheezing   **IF YOU HAVE THESE SIGNS, CONTACT YOUR DOCTOR OR GO TO THE EMERGENCY DEPARTMENT OR URGENT CARE AS SOON AS POSSIBLE**     

## 2021-01-31 ENCOUNTER — Other Ambulatory Visit: Payer: Self-pay

## 2021-01-31 ENCOUNTER — Ambulatory Visit: Payer: No Typology Code available for payment source

## 2021-01-31 DIAGNOSIS — R1312 Dysphagia, oropharyngeal phase: Secondary | ICD-10-CM

## 2021-01-31 NOTE — Therapy (Signed)
[facility] [place], [place], [place], [address] Phone: [phone]   Fax:  [phone]  Speech Language Pathology Treatment  Patient Details  Name: George Nielsen MRN: [id number] Date of Birth: Apr 23, 1955 Referring Provider (SLP): Dr. Sabino Dick   Encounter Date: 01/31/2021   End of Session - 01/31/21 1324     Visit Number 7    Number of Visits 25    Date for SLP Re-Evaluation 03/27/21    Authorization Type VA - 15 visits    Authorization - Visit Number 7    Authorization - Number of Visits 15    SLP Start Time 9371    SLP Stop Time  1230    SLP Time Calculation (min) 35 min    Activity Tolerance Patient tolerated treatment well             Past Medical History:  Diagnosis Date   Allergy    Anemia    Anxiety    Arthritis    Cancer ([place])    tongue and neck lymp nodes   COPD (chronic obstructive pulmonary disease) ([place])    Dyspnea    climbing stairs, walking distance   Emphysema of lung ([facility])    GERD (gastroesophageal reflux disease)    Hepatitis C    treated   History of radiation therapy 11/10/14- 12/29/14   BOT and bilateral neck/ 70 Gy in 35 fractions to gross disease, 63 Gy in 35 fractions to high risk nodal echelons, and 56 Gy in 35 fraction to intermediate risk nodal echelons.    Hypertension    Hypothyroidism    PNA (pneumonia)    4 times   Sleep apnea    wears CPAP sometimes per pt.   05/25/19 - not able to use at this time due to neck pain.   Thyroid disease    take Synthroid d/t scar tissue from radiation around thyroid   Ulcer     Past Surgical History:  Procedure Laterality Date   ANTERIOR CERVICAL DECOMP/DISCECTOMY FUSION N/A 05/27/2020   Procedure: Cervical Three-Four Anterior cervical decompression/discectomy/fusion;  Surgeon: Ashok Pall, MD;  Location: [facility];  Service: Neurosurgery;  Laterality: N/A;  anterior   BACK SURGERY     COLONOSCOPY WITH PROPOFOL N/A 01/06/2020    Procedure: COLONOSCOPY WITH PROPOFOL;  Surgeon: Lavena Bullion, DO;  [facility];  Service: Gastroenterology;  Laterality: N/A;   ELBOW SURGERY Left    HEMOSTASIS CLIP PLACEMENT  01/06/2020   Procedure: HEMOSTASIS CLIP PLACEMENT;  Surgeon: Lavena Bullion, DO;  [facility];  Service: Gastroenterology;;   POLYPECTOMY  01/06/2020   Procedure: POLYPECTOMY;  Surgeon: Lavena Bullion, DO;  [facility];  Service: Gastroenterology;;   SHOULDER SURGERY     TONSILLECTOMY     tracheotomy     WRIST SURGERY     right    There were no vitals filed for this visit.   Subjective Assessment - 01/31/21 1201     Subjective Tacos (hard shell) last night. Spaghetti, ham and cheese, roast beef, potatoes.    Currently in Pain? Yes    Pain Score 1     Pain Location Neck    Pain Orientation Left;Right    Pain Descriptors / Indicators Discomfort    Pain Type Chronic pain    Pain Radiating Towards lt shoulder                   ADULT SLP TREATMENT -  01/31/21 1204       General Information   Behavior/Cognition Alert;Cooperative;Pleasant mood      Treatment Provided   Treatment provided Dysphagia      Dysphagia Treatment   Temperature Spikes Noted No    Respiratory Status Room air    Treatment Methods Therapeutic exercise;Patient/caregiver education    Patient observed directly with PO's Yes    Type of PO's observed Dysphagia 3 (soft);Thin liquids;Regular    Liquids provided via --   bottle   Pharyngeal Phase Signs & Symptoms Immediate throat clear;Immediate cough   throat clear with beef stick 2/2, cough with cracker bite 2/2   Type of cueing Verbal   for precautions - pt did not follow spontaneously, and was inconsistent with adherence after SLP reminders   Other treatment/comments Pt reports he is not modifying diet at all due to his dysphagia, "I eat pretty much what I want" pt stated, despite repeated reminders from SLP about dys III/thin  recommended on last modified. Meat stick and Triscuit crackers today from home. SLP reminded pt that crackers were not on pt's recommended diet and explained rationale. George Nielsen completed his HEP with SBA today; is not doing towel hold exercise (as alternative to CTAR) - "I'm not going to do it- it hurt too bad." SLP encouraged pt to go back to CTAR with fist/chin but pt did not say he was going to do that either.      Assessment / Recommendations / Plan   Plan Other (Comment)   decr to once every other week; pt has not shown he is willing to modify diet despite recommendation of dys III/thin, and requires SBA with HEP at this time     Progression Toward Goals   Progression toward goals Not progressing toward goals (comment)   re: adherence to precautions, however is progressing with HEP             SLP Education - 01/31/21 1323     Education Details precautions to decr risk of aspiration, rationale for recommended diet    Person(s) Educated Patient    Methods Explanation    Comprehension --   indicated understanding (nonverbal)             SLP Short Term Goals - 01/31/21 1326       SLP SHORT TERM GOAL #1   Title pt will complete HEP for dysphagia with occasional min A    Baseline 01-17-21    Status Achieved    Target Date 02/17/21      SLP SHORT TERM GOAL #2   Title Pt will follow swallow precautions with occasional min A    Baseline 01-19-21    Status Achieved    Target Date 02/17/21      SLP SHORT TERM GOAL #3   Title pt will tell SLP 3 s/s aspiration PNA with rare min A    Status Achieved    Target Date 02/17/21      SLP SHORT TERM GOAL #4   Title Pt will follow diet modifications with occasional min A over 3 sessoins    Baseline 01-17-21    Time 6    Period Weeks    Status On-going    Target Date 02/17/21              SLP Long Term Goals - 01/31/21 1331       SLP LONG TERM GOAL #1   Title Pt will complete dysphagia HEP with modified independence over  three sessions    Baseline 01-24-21, 01-31-21    Time 12    Period Weeks    Status On-going      SLP LONG TERM GOAL #2   Title Pt will follow swallow precautions with rare min A over 3 sessions    Baseline 01-19-21    Time 12    Period Weeks    Status On-going      SLP LONG TERM GOAL #3   Title Pt will follow diet modifications with mod I over 2 sessions    Time 12    Period Weeks    Status Deferred   pt has chosen not to follow diet modifications             Plan - 01/31/21 1324     Clinical Impression Statement George Nielsen "George Nielsen" is referred for outpt dysphagia therapy due to ongoing oral, pharyngeal and cp dysphagia. George Nielsen reports he has had pna "about 6 times" and reports 20lb weight loss since 05/2020. He underwent ACDF in March 2022 and reports reduced cervical ROM since then, however he also has fibrosis as late effect of bilateral neck and base of tongue XRT likely limiting ROM as well. Most recent MBSS 12/20/20 revealed moderate aspsiration risk. Moist soft solids and thin liqiuds were recommended with intermittent cough/throat clears, meds as tolerated. He is chewing several larger meds he can't swallow at this time. 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 See "skilled intervention" for more details. Based upon SLP observations in sessions, SLP cont to believe pt is not following scope of diet and swallowing precautions recommended at [facility]. I recommend cont'd skilled ST to reduce risk of aspiration pna and maximize ease and efficiency of swallow and intelligibility.    Speech Therapy Frequency 1x /week    Duration 12 weeks    Treatment/Interventions Aspiration precaution training;Environmental controls;Cueing hierarchy;Pharyngeal strengthening exercises;Compensatory techniques;Functional tasks;Compensatory strategies;SLP instruction and feedback;Patient/family education;Multimodal communcation  approach;Diet toleration management by SLP;Trials of upgraded texture/liquids;Internal/external aids;Other (comment)   repeat FEES or MBSS if indicated   Potential to Achieve Goals Fair    Potential Considerations Severity of impairments;Previous level of function;Co-morbidities             Patient will benefit from skilled therapeutic intervention in order to improve the following deficits and impairments:   Dysphagia, oropharyngeal phase    Problem List Patient Active Problem List   Diagnosis Date Noted   HNP (herniated nucleus pulposus) with myelopathy, cervical 05/27/2020   Positive FIT (fecal immunochemical test)    Polyp of ascending colon    Adenomatous polyp of sigmoid colon    Diverticulosis of colon without hemorrhage    Grade II internal hemorrhoids    Malignant neoplasm of base of tongue ([place]) 10/27/2014   Chronic kidney disease    COPD (chronic obstructive pulmonary disease) ([place]) 07/10/2012   Chest pain 07/10/2012   HTN (hypertension) 07/10/2012   Hyponatremia 07/10/2012   TOBACCO USE 02/07/2010    Canton Yearby ,MS, [place]  01/31/2021, 1:43 PM  [facility] [address] [place], [place], [address] Phone: [phone]   Fax:  [phone]   Name: LAKEITH CAREAGA MRN: [id number] Date of Birth: [birth year]

## 2021-02-14 ENCOUNTER — Other Ambulatory Visit: Payer: Self-pay

## 2021-02-14 ENCOUNTER — Ambulatory Visit: Payer: No Typology Code available for payment source

## 2021-02-14 DIAGNOSIS — R1312 Dysphagia, oropharyngeal phase: Secondary | ICD-10-CM

## 2021-02-14 NOTE — Therapy (Signed)
[facility] [place], [place], [place], [address] Phone: [phone]   Fax:  [phone]  Speech Language Pathology Treatment  Patient Details  Name: George Nielsen MRN: [id number] Date of Birth: Dec 12, 1955 Referring Provider (SLP): Dr. Sabino Dick   Encounter Date: 02/14/2021   End of Session - 02/14/21 1154     Visit Number 8    Number of Visits 25    Date for SLP Re-Evaluation 03/27/21    Authorization Type VA - 15 visits    Authorization - Visit Number 8    Authorization - Number of Visits 15    SLP Start Time 1020    SLP Stop Time  1055    SLP Time Calculation (min) 35 min    Activity Tolerance Patient tolerated treatment well             Past Medical History:  Diagnosis Date   Allergy    Anemia    Anxiety    Arthritis    Cancer ([place])    tongue and neck lymp nodes   COPD (chronic obstructive pulmonary disease) ([place])    Dyspnea    climbing stairs, walking distance   Emphysema of lung ([place])    GERD (gastroesophageal reflux disease)    Hepatitis C    treated   History of radiation therapy 11/10/14- 12/29/14   BOT and bilateral neck/ 70 Gy in 35 fractions to gross disease, 63 Gy in 35 fractions to high risk nodal echelons, and 56 Gy in 35 fraction to intermediate risk nodal echelons.    Hypertension    Hypothyroidism    PNA (pneumonia)    4 times   Sleep apnea    wears CPAP sometimes per pt.   05/25/19 - not able to use at this time due to neck pain.   Thyroid disease    take Synthroid d/t scar tissue from radiation around thyroid   Ulcer     Past Surgical History:  Procedure Laterality Date   ANTERIOR CERVICAL DECOMP/DISCECTOMY FUSION N/A 05/27/2020   Procedure: Cervical Three-Four Anterior cervical decompression/discectomy/fusion;  Surgeon: Ashok Pall, MD;  Location: [facility];  Service: Neurosurgery;  Laterality: N/A;  anterior   BACK SURGERY     COLONOSCOPY WITH PROPOFOL N/A 01/06/2020    Procedure: COLONOSCOPY WITH PROPOFOL;  Surgeon: Lavena Bullion, DO;  Location: [facility];  Service: Gastroenterology;  Laterality: N/A;   ELBOW SURGERY Left    HEMOSTASIS CLIP PLACEMENT  01/06/2020   Procedure: HEMOSTASIS CLIP PLACEMENT;  Surgeon: Lavena Bullion, DO;  Location: [facility];  Service: Gastroenterology;;   POLYPECTOMY  01/06/2020   Procedure: POLYPECTOMY;  Surgeon: Lavena Bullion, DO;  Location: [facility];  Service: Gastroenterology;;   SHOULDER SURGERY     TONSILLECTOMY     tracheotomy     WRIST SURGERY     right    There were no vitals filed for this visit.   Subjective Assessment - 02/14/21 1021     Subjective Pt cannot have POs today due to dentist at 11:30    Currently in Pain? Yes    Pain Score 2     Pain Location Neck    Pain Orientation Left    Pain Descriptors / Indicators Discomfort    Pain Type Chronic pain    Pain Radiating Towards lt shoulder    Pain Onset More than a month ago    Pain Frequency Constant  ADULT SLP TREATMENT - 02/14/21 1025       General Information   Behavior/Cognition Alert;Cooperative;Pleasant mood      Treatment Provided   Treatment provided Dysphagia      Dysphagia Treatment   Temperature Spikes Noted No    Respiratory Status Room air    Treatment Methods Therapeutic exercise;Patient/caregiver education    Patient observed directly with PO's Yes    Type of PO's observed Thin liquids   See "S"   Liquids provided via --   bottle   Pharyngeal Phase Signs & Symptoms Immediate throat clear    Other treatment/comments Tacos last night; hard shell. Upon direct questioning pt acknowledged he is not following recommended diet and eating what he likes. SLP educated pt that if he chooses this when he is bedridden due to flu/covid/sickness or has any s/sx PNA  he should immediately revert to soft mushy foods. HEP completed today with independence, pt reported "I'm getting in all the reps  for all these (exercises)." SLP told pt to cont with HEP at least 6 days/week and then down to 3 days/week and provided rationale for this.      Assessment / Recommendations / Plan   Plan Continue with current plan of care      Progression Toward Goals   Progression toward goals --   cont to eat desired diet; states doing HEP at least 6 days/week             SLP Education - 02/14/21 1154     Education Details see daily note and pt instructions    Person(s) Educated Patient    Comprehension Verbalized understanding              SLP Short Term Goals - 02/14/21 1156       SLP SHORT TERM GOAL #1   Title pt will complete HEP for dysphagia with occasional min A    Baseline 01-17-21    Status Achieved    Target Date 02/17/21      SLP SHORT TERM GOAL #2   Title Pt will follow swallow precautions with occasional min A    Baseline 01-19-21    Status Achieved    Target Date 02/17/21      SLP SHORT TERM GOAL #3   Title pt will tell SLP 3 s/s aspiration PNA with rare min A    Status Achieved    Target Date 02/17/21      SLP SHORT TERM GOAL #4   Title Pt will follow diet modifications with occasional min A over 3 sessoins    Baseline 01-17-21    Time 6    Period Weeks    Status Partially Met    Target Date 02/17/21              SLP Long Term Goals - 02/14/21 1156       SLP LONG TERM GOAL #1   Title Pt will complete dysphagia HEP with modified independence over four sessions    Baseline 01-24-21, 01-31-21, 02-14-21    Time 12    Period Weeks    Status Revised      SLP LONG TERM GOAL #2   Title Pt will follow swallow precautions with rare min A over 3 sessions    Baseline 01-19-21    Time 12    Period Weeks    Status On-going      SLP LONG TERM GOAL #3   Title Pt will follow diet modifications with  mod I over 2 sessions    Time 12    Period Weeks    Status Deferred   pt has chosen not to follow diet modifications             Plan - 02/14/21 1154      Clinical Impression Statement George Nielsen "George Nielsen" is referred for outpt dysphagia therapy due to ongoing oral, pharyngeal and cp dysphagia. George Nielsen reports he has had pna "about 6 times" and reports 20lb weight loss since 05/2020. He underwent ACDF in March 2022 and reports reduced cervical ROM since then, however he also has fibrosis as late effect of bilateral neck and base of tongue XRT likely limiting ROM as well. Most recent MBSS 12/20/20 revealed moderate aspsiration risk. Moist soft solids and thin liqiuds were recommended with intermittent cough/throat clears, meds as tolerated. He is chewing several larger meds he can't swallow at this time. 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 See "skilled intervention" for more details. Pt reported today he is not following scope of diet and swallowing precautions recommended at [facility]. I recommend cont'd skilled ST every other week to reduce risk of aspiration pna and maximize ease and efficiency of swallow and intelligibility. Discharge likely in 1-2 visits.    Speech Therapy Frequency --   every other week   Duration 12 weeks    Treatment/Interventions Aspiration precaution training;Environmental controls;Cueing hierarchy;Pharyngeal strengthening exercises;Compensatory techniques;Functional tasks;Compensatory strategies;SLP instruction and feedback;Patient/family education;Multimodal communcation approach;Diet toleration management by SLP;Trials of upgraded texture/liquids;Internal/external aids;Other (comment)   repeat FEES or MBSS if indicated   Potential to Achieve Goals Fair    Potential Considerations Severity of impairments;Previous level of function;Co-morbidities             Patient will benefit from skilled therapeutic intervention in order to improve the following deficits and impairments:   Dysphagia, oropharyngeal phase    Problem List Patient Active Problem  List   Diagnosis Date Noted   HNP (herniated nucleus pulposus) with myelopathy, cervical 05/27/2020   Positive FIT (fecal immunochemical test)    Polyp of ascending colon    Adenomatous polyp of sigmoid colon    Diverticulosis of colon without hemorrhage    Grade II internal hemorrhoids    Malignant neoplasm of base of tongue ([place]) 10/27/2014   Chronic kidney disease    COPD (chronic obstructive pulmonary disease) ([place]) 07/10/2012   Chest pain 07/10/2012   HTN (hypertension) 07/10/2012   Hyponatremia 07/10/2012   TOBACCO USE 02/07/2010    [place], [place] 02/14/2021, 11:58 AM  [facility] [address], [place], [place], [address] Phone: [phone]   Fax:  [phone]   Name: George Nielsen MRN: [id number] Date of Birth: Jan 11, 1956

## 2021-02-14 NOTE — Patient Instructions (Signed)
  Do exercises at least 6 days/week twice a day until Christmas then do them once/day at least 3 days/week  If you get sick with flu/virus and are bedridden, or have signs of pneumonia stick with soft foods only, and get checked by MD if signs of pneumonia

## 2021-02-28 ENCOUNTER — Ambulatory Visit: Payer: No Typology Code available for payment source | Attending: Neurosurgery

## 2021-02-28 ENCOUNTER — Other Ambulatory Visit: Payer: Self-pay

## 2021-02-28 DIAGNOSIS — R1312 Dysphagia, oropharyngeal phase: Secondary | ICD-10-CM | POA: Diagnosis not present

## 2021-02-28 NOTE — Patient Instructions (Signed)
  I contacted Dr. Christella Noa and let him know I suggest another swallow test, like we talked about today - to mark your improvement and to get another "mile marker" in case you have to have another test in the future.

## 2021-02-28 NOTE — Therapy (Signed)
[facility] [place], [place], [place], [address] Phone: [phone]   Fax:  [phone]  Speech Language Pathology Treatment  Patient Details  Name: George Nielsen MRN: [id number] Date of Birth: [birth year] Referring Provider (SLP): Dr. Sabino Dick   Encounter Date: 02/28/2021   End of Session - 02/28/21 1516     Visit Number 9    Number of Visits 25    Date for SLP Re-Evaluation 03/27/21    Authorization Type [facility] - 15 visits    Authorization - Visit Number 9    Authorization - Number of Visits 15    SLP Start Time 7939    SLP Stop Time  1230    SLP Time Calculation (min) 41 min    Activity Tolerance Patient tolerated treatment well             Past Medical History:  Diagnosis Date   Allergy    Anemia    Anxiety    Arthritis    Cancer ([place])    tongue and neck lymp nodes   COPD (chronic obstructive pulmonary disease) ([place])    Dyspnea    climbing stairs, walking distance   Emphysema of lung ([facility])    GERD (gastroesophageal reflux disease)    Hepatitis C    treated   History of radiation therapy 11/10/14- 12/29/14   BOT and bilateral neck/ 70 Gy in 35 fractions to gross disease, 63 Gy in 35 fractions to high risk nodal echelons, and 56 Gy in 35 fraction to intermediate risk nodal echelons.    Hypertension    Hypothyroidism    PNA (pneumonia)    4 times   Sleep apnea    wears CPAP sometimes per pt.   05/25/19 - not able to use at this time due to neck pain.   Thyroid disease    take Synthroid d/t scar tissue from radiation around thyroid   Ulcer     Past Surgical History:  Procedure Laterality Date   ANTERIOR CERVICAL DECOMP/DISCECTOMY FUSION N/A 05/27/2020   Procedure: Cervical Three-Four Anterior cervical decompression/discectomy/fusion;  Surgeon: Ashok Pall, MD;  Location: [facility];  Service: Neurosurgery;  Laterality: N/A;  anterior   BACK SURGERY     COLONOSCOPY WITH PROPOFOL N/A 01/06/2020    Procedure: COLONOSCOPY WITH PROPOFOL;  Surgeon: Lavena Bullion, DO;  [facility];  Service: Gastroenterology;  Laterality: N/A;   ELBOW SURGERY Left    HEMOSTASIS CLIP PLACEMENT  01/06/2020   Procedure: HEMOSTASIS CLIP PLACEMENT;  Surgeon: Lavena Bullion, DO;  [facility];  Service: Gastroenterology;;   POLYPECTOMY  01/06/2020   Procedure: POLYPECTOMY;  Surgeon: Lavena Bullion, DO;  Location: [facility];  Service: Gastroenterology;;   SHOULDER SURGERY     TONSILLECTOMY     tracheotomy     WRIST SURGERY     right    There were no vitals filed for this visit.   Subjective Assessment - 02/28/21 1156     Currently in Pain? Yes    Pain Score 3     Pain Location Neck    Pain Orientation Right;Posterior    Pain Descriptors / Indicators Sore    Pain Type Chronic pain    Pain Onset More than a month ago    Pain Frequency Constant    Multiple Pain Sites Yes    Pain Score 3    Pain Location Shoulder    Pain Orientation Left    Pain  Descriptors / Indicators Aching    Pain Type Chronic pain    Pain Onset More than a month ago    Pain Frequency Constant                   ADULT SLP TREATMENT - 02/28/21 1159       General Information   Behavior/Cognition Alert;Cooperative;Pleasant mood      Treatment Provided   Treatment provided Dysphagia      Dysphagia Treatment   Temperature Spikes Noted No    Respiratory Status Room air    Treatment Methods Therapeutic exercise;Patient/caregiver education    Type of PO's observed Regular;Thin liquids;Dysphagia 1 (puree)   due to pt telling SLP he had Triscuit crackers this past week   Oral Phase Signs & Symptoms Prolonged bolus formation;Prolonged mastication    Pharyngeal Phase Signs & Symptoms Immediate throat clear    Other treatment/comments Tacos, cooked potato casserole, V8, cold cereal, and coffee in last 24 hours. Pt endorsed eating crackers - Triscuits. Forgot his HEP today "I gotta have  them memorized by now though" pt stated.      Assessment / Recommendations / Plan   Plan Continue with current plan of care   likely d/c next session     Progression Toward Goals   Progression toward goals --   pt cont to choose foods other than strictly dys III, and uses recommended compensations sporadically             SLP Education - 02/28/21 1515     Education Details crackers are not in recommended diet, if you have them use water to wet the bolus not to take a swallow - take a swallow after the cracker is out of your mouth, must do exercises after d/c at least x3/week (once a day) and provided rationale    Person(s) Educated Patient    Methods Explanation    Comprehension Verbalized understanding              SLP Short Term Goals - 02/14/21 1156       SLP SHORT TERM GOAL #1   Title pt will complete HEP for dysphagia with occasional min A    Baseline 01-17-21    Status Achieved    Target Date 02/17/21      SLP SHORT TERM GOAL #2   Title Pt will follow swallow precautions with occasional min A    Baseline 01-19-21    Status Achieved    Target Date 02/17/21      SLP SHORT TERM GOAL #3   Title pt will tell SLP 3 s/s aspiration PNA with rare min A    Status Achieved    Target Date 02/17/21      SLP SHORT TERM GOAL #4   Title Pt will follow diet modifications with occasional min A over 3 sessoins    Baseline 01-17-21    Time 6    Period Weeks    Status Partially Met    Target Date 02/17/21              SLP Long Term Goals - 02/28/21 1224       SLP LONG TERM GOAL #1   Title Pt will complete dysphagia HEP with modified independence over five sessions    Baseline 01-24-21, 01-31-21, 02-14-21, 02-28-21    Time 12    Period Weeks    Status Revised    Target Date 03/27/21      SLP LONG TERM  GOAL #2   Title Pt will follow swallow precautions with rare min A over 3 sessions    Baseline 01-19-21    Time 12    Period Weeks    Status Deferred   by  choice, pt does not follow precautions at home with any regularity     SLP LONG TERM GOAL #3   Title Pt will follow diet modifications with mod I over 2 sessions    Time 12    Period Weeks    Status Deferred   pt has chosen not to follow diet modifications             Plan - 02/28/21 1516     Clinical Impression Statement George Nielsen "George Nielsen" is referred for outpt dysphagia therapy due to ongoing oral, pharyngeal and cp dysphagia. George Nielsen reports he has had pna "about 6 times" and reports 20lb weight loss since 05/2020. He underwent ACDF in March 2022 and reports reduced cervical ROM since then, however he also has fibrosis as late effect of bilateral neck and base of tongue XRT likely limiting ROM as well. Most recent modified barium swallow (MBSS) 12/20/20 revealed moderate aspsiration risk. Moist soft solids and thin liqiuds were recommended with intermittent cough/throat clears, meds as tolerated. He is chewing several larger meds he can't swallow at this time. 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 See "skilled intervention" for more details. Pt reported today he continues to choose food items outside of recommended diet and intermittently follows some of the swallowing precautions recommended at [facility]. (see "pt education" for more details)  I recommend cont'd skilled ST every other week for one more visit to reduce risk of aspiration pna and maximize ease and efficiency of swallow and intelligibility. SLP sent inbasket to referring MD Christella Noa) to suggest follow up MBSS.    Speech Therapy Frequency --   every other week   Duration 12 weeks    Treatment/Interventions Aspiration precaution training;Environmental controls;Cueing hierarchy;Pharyngeal strengthening exercises;Compensatory techniques;Functional tasks;Compensatory strategies;SLP instruction and feedback;Patient/family education;Multimodal communcation  approach;Diet toleration management by SLP;Trials of upgraded texture/liquids;Internal/external aids;Other (comment)   repeat FEES or MBSS if indicated   Potential to Achieve Goals Fair    Potential Considerations Severity of impairments;Previous level of function;Co-morbidities             Patient will benefit from skilled therapeutic intervention in order to improve the following deficits and impairments:   Dysphagia, oropharyngeal phase    Problem List Patient Active Problem List   Diagnosis Date Noted   HNP (herniated nucleus pulposus) with myelopathy, cervical 05/27/2020   Positive FIT (fecal immunochemical test)    Polyp of ascending colon    Adenomatous polyp of sigmoid colon    Diverticulosis of colon without hemorrhage    Grade II internal hemorrhoids    Malignant neoplasm of base of tongue ([place]) 10/27/2014   Chronic kidney disease    COPD (chronic obstructive pulmonary disease) ([place]) 07/10/2012   Chest pain 07/10/2012   HTN (hypertension) 07/10/2012   Hyponatremia 07/10/2012   TOBACCO USE 02/07/2010    [place], [place] 02/28/2021, 3:20 PM  [facility] [address] [place], [place], [address] Phone: [phone]   Fax:  [phone]   Name: George Nielsen MRN: [id number] Date of Birth: 05/11/1955

## 2021-03-14 ENCOUNTER — Other Ambulatory Visit: Payer: Self-pay

## 2021-03-14 ENCOUNTER — Ambulatory Visit: Payer: No Typology Code available for payment source

## 2021-03-14 DIAGNOSIS — R1312 Dysphagia, oropharyngeal phase: Secondary | ICD-10-CM | POA: Diagnosis not present

## 2021-03-16 NOTE — Therapy (Signed)
[facility], [place], [place], [address] Phone: [phone]   Fax:  [phone]  Speech Language Pathology Treatment/Discharge summary  Patient Details  Name: George Nielsen MRN: [id number] Date of Birth: 08/03/1955 Referring Provider (SLP): Dr. Sabino Dick   Encounter Date: 03/14/2021   End of Session - 03/16/21 0048     Visit Number 10    Number of Visits 25    Date for SLP Re-Evaluation 03/27/21    Authorization Type [facility] - 15 visits    Authorization - Visit Number 10    Authorization - Number of Visits 15    SLP Start Time 9292    SLP Stop Time  1230    SLP Time Calculation (min) 37 min    Activity Tolerance Patient tolerated treatment well             Past Medical History:  Diagnosis Date   Allergy    Anemia    Anxiety    Arthritis    Cancer ([place])    tongue and neck lymp nodes   COPD (chronic obstructive pulmonary disease) ([place])    Dyspnea    climbing stairs, walking distance   Emphysema of lung ([facility])    GERD (gastroesophageal reflux disease)    Hepatitis C    treated   History of radiation therapy 11/10/14- 12/29/14   BOT and bilateral neck/ 70 Gy in 35 fractions to gross disease, 63 Gy in 35 fractions to high risk nodal echelons, and 56 Gy in 35 fraction to intermediate risk nodal echelons.    Hypertension    Hypothyroidism    PNA (pneumonia)    4 times   Sleep apnea    wears CPAP sometimes per pt.   05/25/19 - not able to use at this time due to neck pain.   Thyroid disease    take Synthroid d/t scar tissue from radiation around thyroid   Ulcer     Past Surgical History:  Procedure Laterality Date   ANTERIOR CERVICAL DECOMP/DISCECTOMY FUSION N/A 05/27/2020   Procedure: Cervical Three-Four Anterior cervical decompression/discectomy/fusion;  Surgeon: Ashok Pall, MD;  Location: [facility];  Service: Neurosurgery;  Laterality: N/A;  anterior   BACK SURGERY     COLONOSCOPY WITH PROPOFOL N/A  01/06/2020   Procedure: COLONOSCOPY WITH PROPOFOL;  Surgeon: Lavena Bullion, DO;  Location: [facility];  Service: Gastroenterology;  Laterality: N/A;   ELBOW SURGERY Left    HEMOSTASIS CLIP PLACEMENT  01/06/2020   Procedure: HEMOSTASIS CLIP PLACEMENT;  Surgeon: Lavena Bullion, DO;  Location: [facility];  Service: Gastroenterology;;   POLYPECTOMY  01/06/2020   Procedure: POLYPECTOMY;  Surgeon: Lavena Bullion, DO;  Location: [facility];  Service: Gastroenterology;;   SHOULDER SURGERY     TONSILLECTOMY     tracheotomy     WRIST SURGERY     right    There were no vitals filed for this visit.  SPEECH THERAPY DISCHARGE SUMMARY  Visits from Start of Care: 10  Current functional level related to goals / functional outcomes: See below.   Remaining deficits: Dysphagia, specified with fa ollow up MBS, suggested last session to referring MD.   Education / Equipment: MBS results from previous study (swallow precautions), HEP, late effects head/neck radiation on swallow ability.   Patient agrees to discharge. Patient goals were met. Patient is being discharged due to being pleased with the current functional level..      Subjective Assessment - 03/16/21  0045     Subjective "I've done 'em." (HEP)    Currently in Pain? Yes    Pain Score 3     Pain Location Neck    Pain Orientation Right    Pain Onset More than a month ago    Pain Onset More than a month ago                   ADULT SLP TREATMENT - 03/16/21 0001       General Information   Behavior/Cognition Alert;Cooperative;Pleasant mood      Treatment Provided   Treatment provided Dysphagia      Dysphagia Treatment   Temperature Spikes Noted No    Type of PO's observed Regular;Thin liquids    Oral Phase Signs & Symptoms Prolonged mastication    Pharyngeal Phase Signs & Symptoms Delayed throat clear    Other treatment/comments V8, potato casserole, tacos, moravian cookies, cold cereal - no overt  s/sx of pharyngeal difficulty reported with moravian cookies. Pt brought crackers today for session. With crackers today pt hocked regularly and pt stated that was because he was following precautions from one of his MBS exams. HEP completed with mod I, adn pt told SLP when he [place] decr freqeuncy of HEP. SLP told pt who to contact with swallowing difficulties in teh future.      Assessment / Recommendations / Plan   Plan Discharge SLP treatment due to (comment)   pt satisfied with progress     Dysphagia Recommendations   Diet recommendations Dysphagia 3 (mechanical soft);Thin liquid    Liquids provided via Cup    Medication Administration --   as deliniated on last modified     Progression Toward Goals   Progression toward goals Progressing toward goals              SLP Education - 03/16/21 0048     Education Details who to contact if difficulty with swallowing should ensue    Person(s) Educated Patient    Methods Explanation    Comprehension Verbalized understanding              SLP Short Term Goals - 02/14/21 1156       SLP SHORT TERM GOAL #1   Title pt will complete HEP for dysphagia with occasional min A    Baseline 01-17-21    Status Achieved    Target Date 02/17/21      SLP SHORT TERM GOAL #2   Title Pt will follow swallow precautions with occasional min A    Baseline 01-19-21    Status Achieved    Target Date 02/17/21      SLP SHORT TERM GOAL #3   Title pt will tell SLP 3 s/s aspiration PNA with rare min A    Status Achieved    Target Date 02/17/21      SLP SHORT TERM GOAL #4   Title Pt will follow diet modifications with occasional min A over 3 sessoins    Baseline 01-17-21    Time 6    Period Weeks    Status Partially Met    Target Date 02/17/21              SLP Long Term Goals - 03/16/21 0050       SLP LONG TERM GOAL #1   Title Pt will complete dysphagia HEP with modified independence over five sessions    Baseline 01-24-21, 01-31-21,  02-14-21, 02-28-21    Status Achieved  SLP LONG TERM GOAL #2   Title Pt will follow swallow precautions with rare min A over 3 sessions    Baseline 01-19-21    Status Partially Met   by choice, pt does not follow precautions at home with any regularity     SLP LONG TERM GOAL #3   Title Pt will follow diet modifications with mod I over 2 sessions    Time 12    Period Weeks    Status Deferred   pt has chosen not to follow diet modifications             Plan - 03/16/21 0049     Clinical Impression Statement George Nielsen "[name]" is referred for outpt dysphagia therapy due to ongoing oral, pharyngeal and cp dysphagia. [name] reports he has had pna "about 6 times" and reports 20lb weight loss since 05/2020. He underwent ACDF in March 2022 and reports reduced cervical ROM since then, however he also has fibrosis as late effect of bilateral neck and base of tongue XRT likely limiting ROM as well. Most recent modified barium swallow (MBSS) 12/20/20 revealed moderate aspsiration risk. Moist soft solids and thin liqiuds were recommended with intermittent cough/throat clears, meds as tolerated. He is chewing several larger meds he can't swallow at this time. 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 See "skilled intervention" for more details. Pt demonstrated today he continues to choose food items outside of recommended diet and intermittently follows some of the swallowing precautions recommended at [facility]. D/C recommended at this time and agreed upon by pt.    Treatment/Interventions Aspiration precaution training;Environmental controls;Cueing hierarchy;Pharyngeal strengthening exercises;Compensatory techniques;Functional tasks;Compensatory strategies;SLP instruction and feedback;Patient/family education;Multimodal communcation approach;Diet toleration management by SLP;Trials of upgraded texture/liquids;Internal/external  aids;Other (comment)   repeat FEES or MBSS if indicated   Potential to Achieve Goals Fair    Potential Considerations Severity of impairments;Previous level of function;Co-morbidities             Patient will benefit from skilled therapeutic intervention in order to improve the following deficits and impairments:   Dysphagia, oropharyngeal phase    Problem List Patient Active Problem List   Diagnosis Date Noted   HNP (herniated nucleus pulposus) with myelopathy, cervical 05/27/2020   Positive FIT (fecal immunochemical test)    Polyp of ascending colon    Adenomatous polyp of sigmoid colon    Diverticulosis of colon without hemorrhage    Grade II internal hemorrhoids    Malignant neoplasm of base of tongue ([place]) 10/27/2014   Chronic kidney disease    COPD (chronic obstructive pulmonary disease) ([employer]) 07/10/2012   Chest pain 07/10/2012   HTN (hypertension) 07/10/2012   Hyponatremia 07/10/2012   TOBACCO USE 02/07/2010    [place], [place] 03/16/2021, 12:51 AM  [facility] [address] [place], [place], [address] Phone: [phone]   Fax:  [phone]   Name: TIBOR LEMMONS MRN: [id number] Date of Birth: 1955/12/22

## 2022-02-25 IMAGING — CR DG CERVICAL SPINE COMPLETE 4+V
4 series · 4 of 4 positions shown · non-contrast
Comparison: 10/26/2019

CLINICAL DATA: ACDF C3-4

EXAM:
CERVICAL SPINE - COMPLETE 4+ VIEW

[xtable lateral (1 of 4)]
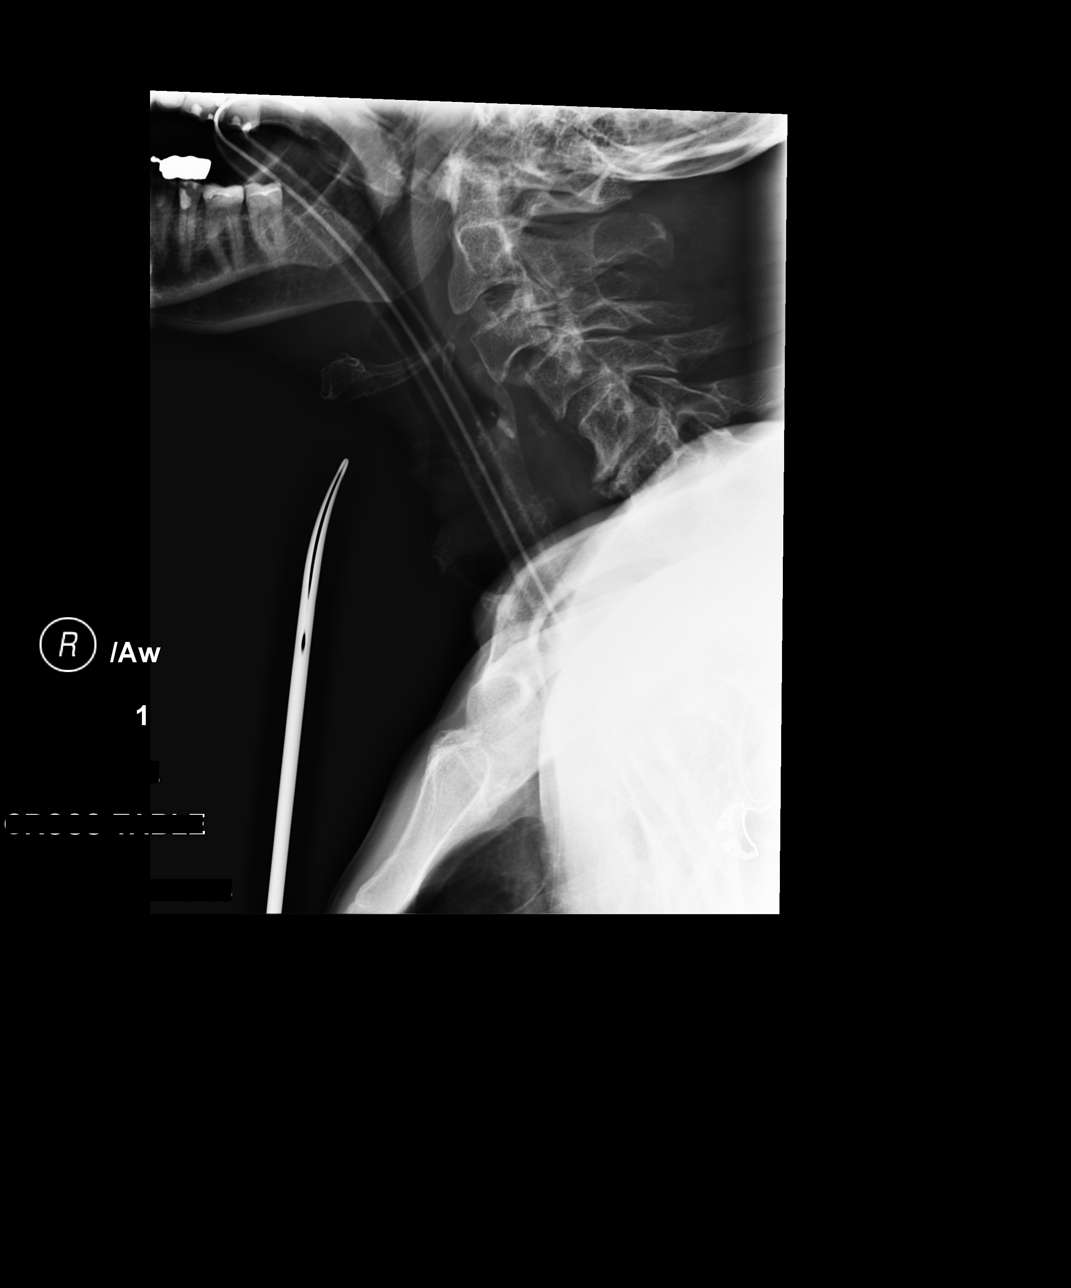

[xtable lateral (2 of 4)]
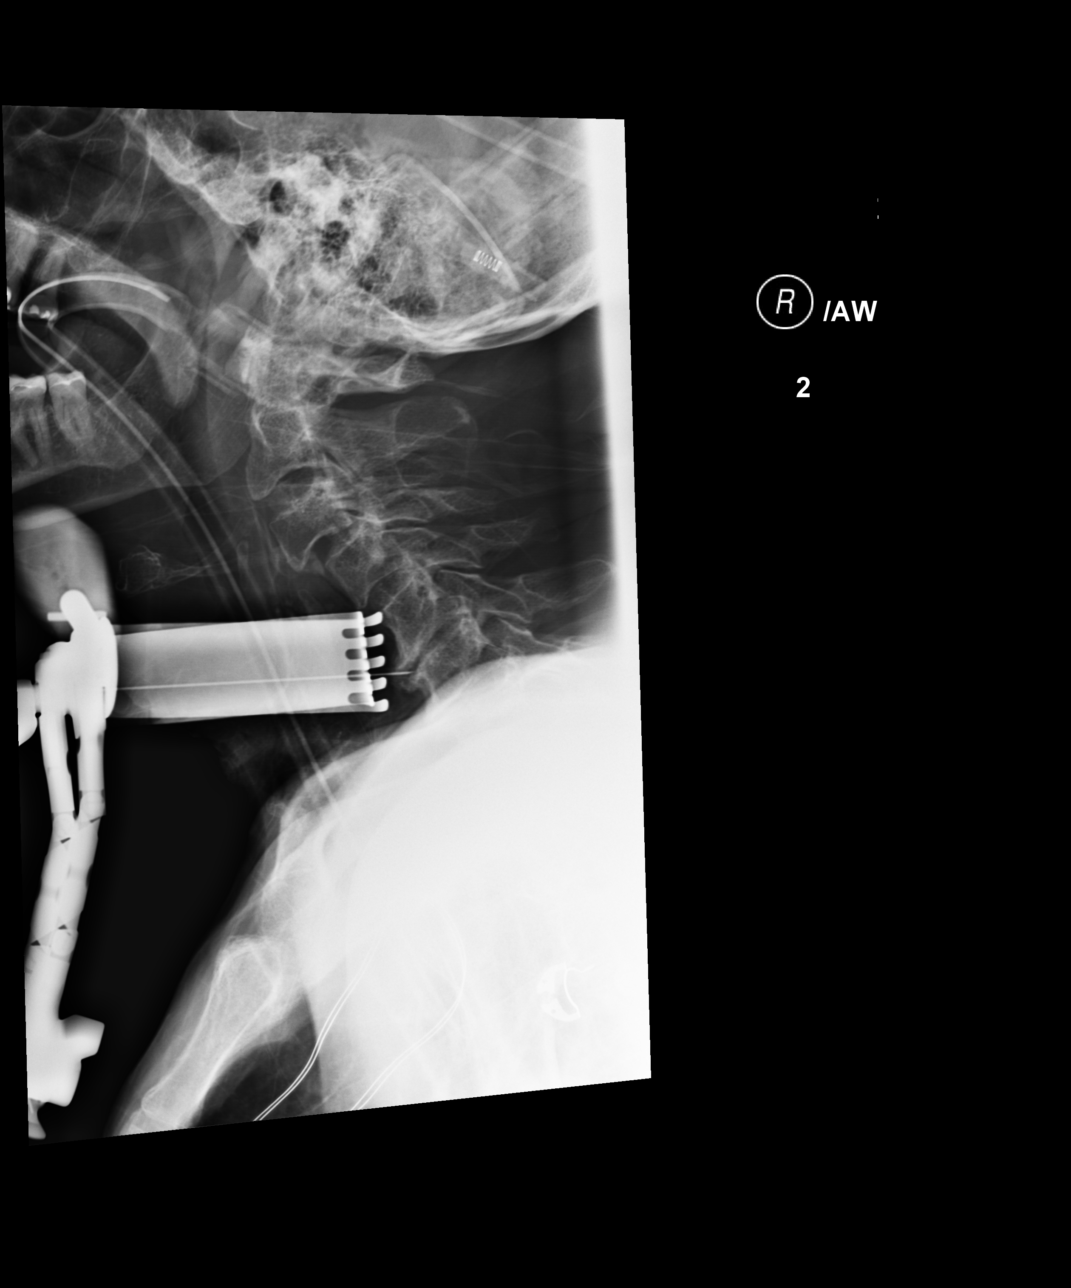

[xtable lateral (3 of 4)]
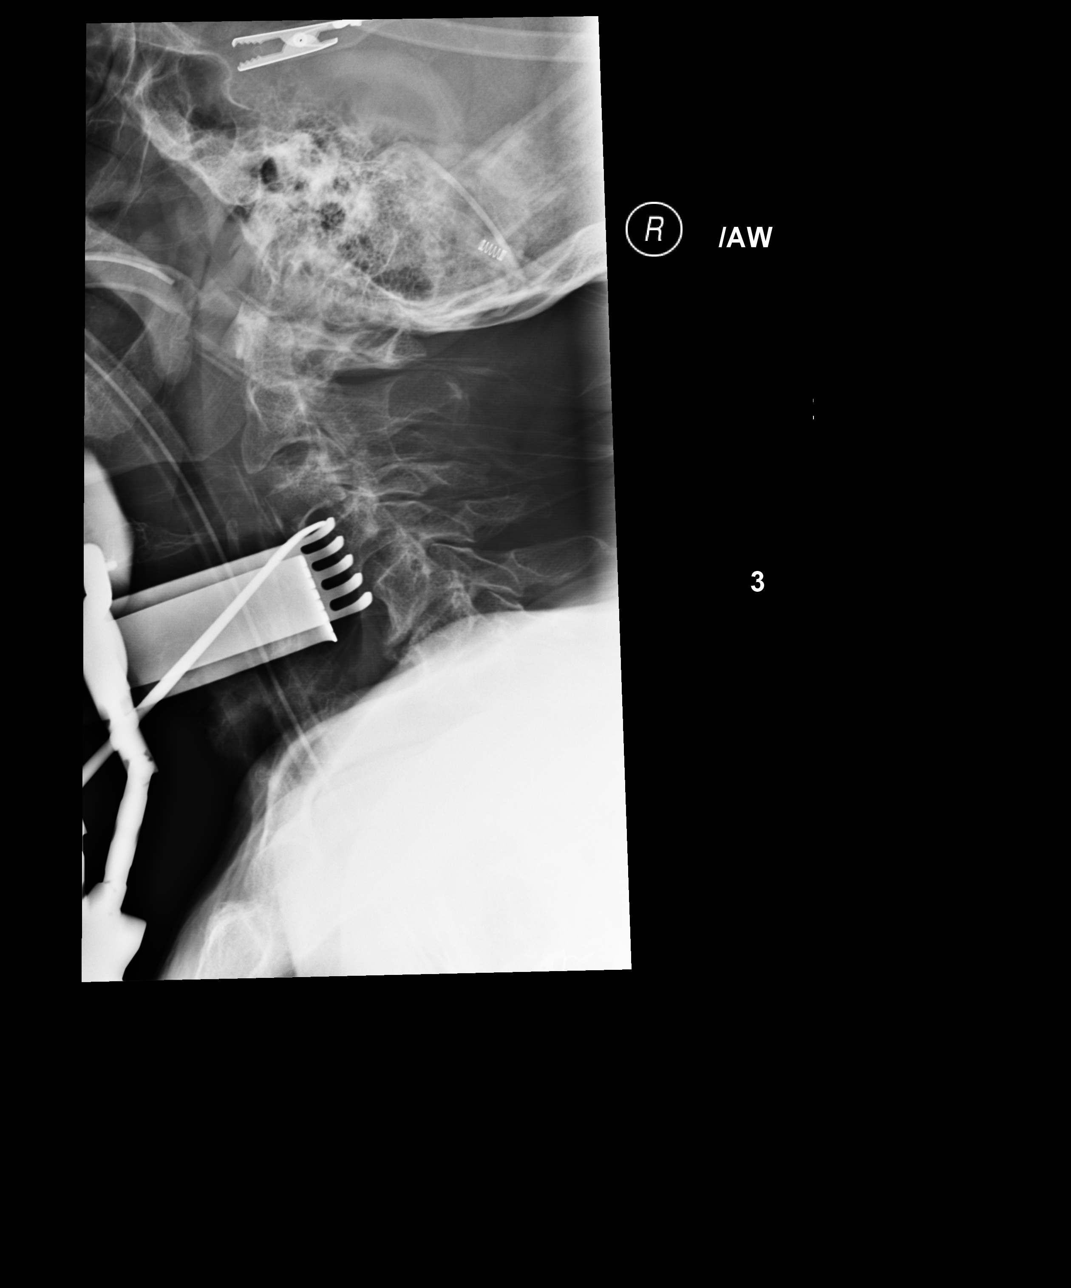

[xtable lateral (4 of 4)]
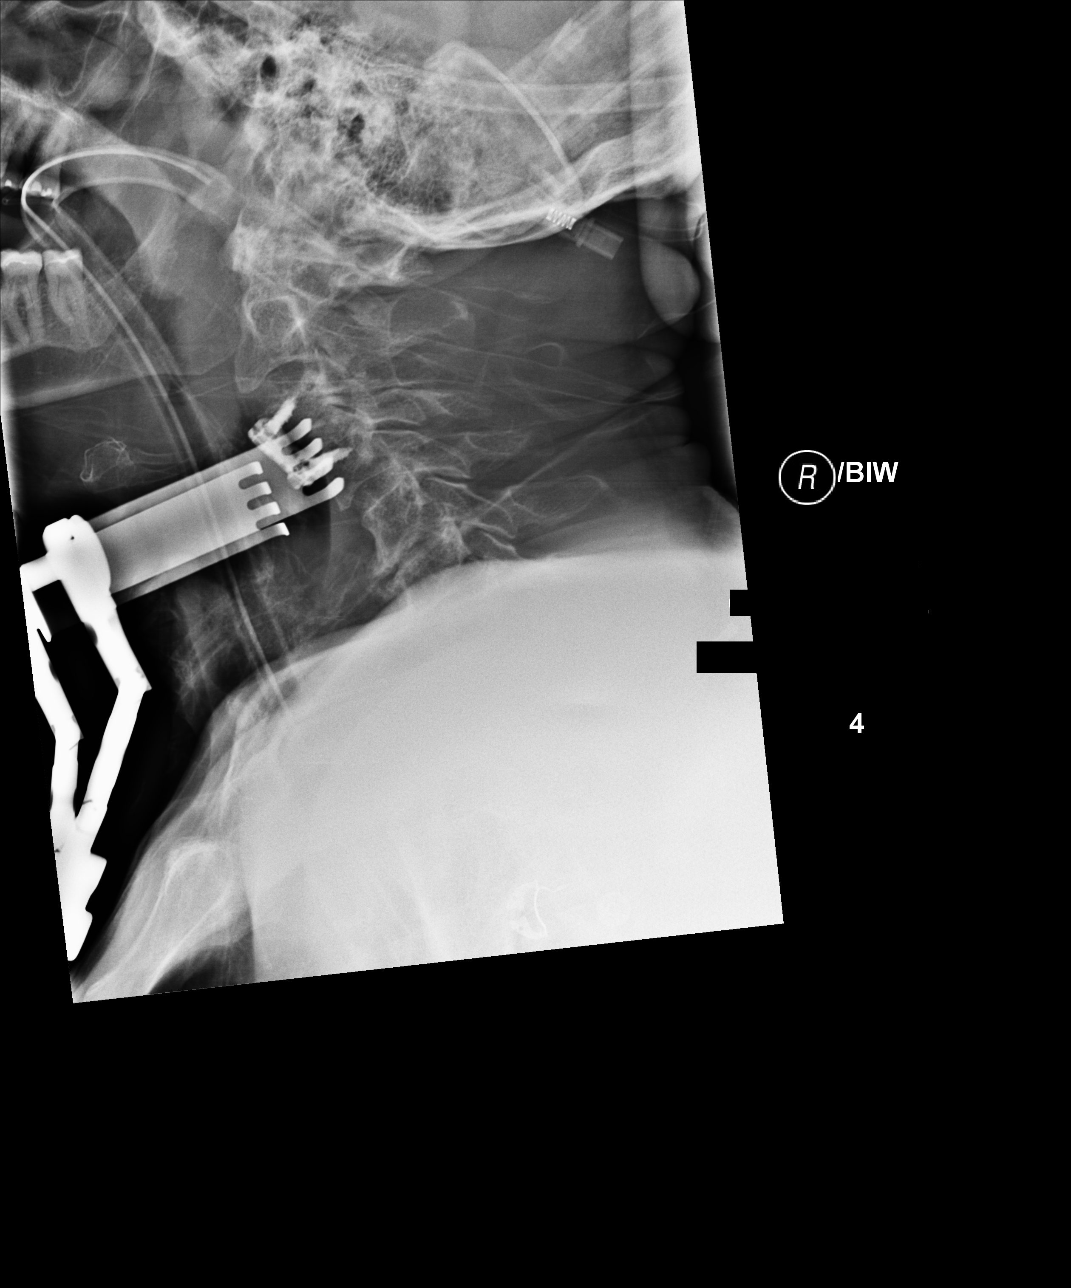

[4 of 4 positions shown; findings below may reference images not displayed]

FINDINGS: Four images were obtained in the operating room. The patient is
intubated.

Image number 1 demonstrates approximately 5 mm anterolisthesis of
L3-4

Image number 2 demonstrates needle localization of the C5-6 disc
space

Image number 3 demonstrates surgical localization of the C3-4 disc
space with a surgical instrument in the disc space

Image number 4 demonstrates ACDF at C3-4. Anterior plate and screws
in good position.
IMPRESSION: ACDF C3-4.

## 2023-01-31 ENCOUNTER — Other Ambulatory Visit (HOSPITAL_BASED_OUTPATIENT_CLINIC_OR_DEPARTMENT_OTHER): Payer: Self-pay

## 2023-01-31 MED ORDER — COMIRNATY 30 MCG/0.3ML IM SUSY
PREFILLED_SYRINGE | INTRAMUSCULAR | 0 refills | Status: DC
  Filled 2023-01-31: qty 0.3, 1d supply, fill #0

## 2023-10-30 ENCOUNTER — Encounter: Payer: Self-pay | Admitting: Neurology

## 2023-11-06 NOTE — Progress Notes (Signed)
 Assessment/Plan:   1.  Anterocollis  - The primary muscles involved are bilateral sternocleidomastoid, left greater than right, left levator scapula, right splenius capitis.  His semispinalis capitis muscles are incredibly tight bilaterally.  - I am not sure that this is all a dystonia problem.  I do think he has some dystonia, but the question really is if this is a primary problem or a reactive problem.  Patient has had radiation to the neck, but the patient subsequently had neck surgery in 2023 and reports that many of his issues with being unable to hold up the neck started around that time after the surgery.  Botox has been of some value, but incompletely so.  Given his last MRI was in December, 2023 and was fairly abnormal, I would recommend we go ahead and repeat that.  He had significant edema in that examination.  He was agreeable to this approach.  He also reports that he is having episodes of spasms where he is just suddenly dropping objects, and I wonder if this is a symptom of myelopathy.  He is hyperreflexic.  - Patient's last Botox injections were October 21, 2023.  I am happy to try to get that prior authorized to see if we can go ahead and proceed with further injections in 3 months.  Again, I am not sure that this is the sole issue.  2. dysphagia  - Patient has had history of radiation to the neck, but reports that the dysphagia started after his neck surgery in 2023.  Reports that he is seeing ENT at the [facility] and is working on getting a referral from the [facility] to ENT at [facility].  He would like to do that more locally so he does not have to drive all the way to [facility].  I gave him the name of local ENT, but he will still need to get the referral from the [facility]. Subjective:   George Nielsen was seen today in neurologic consultation at the request of [facility]*.  The consultation is for the evaluation of cervical dystonia.  Prior  medical records made available to me are reviewed.  Patient has been treated for this by Dr. Milo at [facility] since 2023.  Notes from Dr. Milo indicate that patient has a history of throat cancer with radiation to the neck for 35 treatments.  5 years posttreatment, he began to develop shoulder numbness and subsequently had neck surgery with Dr. Gillie in March, 2022.  After that, the patient had trouble extending the neck.  He felt like the neck was being pulled forward and to the left.  He also noted his tongue was cramping.  This started not long after radiation, but was worse after the neck surgery.  He tried tizanidine without benefit.  He took gabapentin  without benefit.  Botox was initiated in December, 2023 with most of the treatments to the left sternocleidomastoid, left splenius capitis, left trapezius.  His injection dosages changed somewhat over the years, but not necessarily the muscles.  EMG was not used.  The last injections were October 21, 2023.  I believe that it was only the left sternocleidomastoid, but the notes are a little bit difficult as it states sternocleidomastoid 50, 100 but above that it says left.  He did note that he thought that some of the problem was not cervical dystonia at all, but rather musculoskeletal and orthopedic issues with the neck that were  causing him not to be able to lift the neck.  Asked him right of the cervical spine was done December, 2023 which did demonstrate nonspecific prevertebral edema spanning the cervical spine as well as ill-defined edema in the dorsal soft tissues throughout the cervical spine.  I felt that this could be related to his abnormal mechanics/muscular strain.  There was marrow edema at C3.  There was postsurgical changes at C3-C4 with residual severe left and moderate right neural foraminal stenosis.  Pt reports that botox seems to help with his headaches, which are described as posterior occiput pain.  He is also  able to turn head left and right better.  It also gave me more confidence.  He doesn't notice swallow trouble from the botox but I've already got swallow trouble.  He doesn't associate it with the botox but associates with it post neck surgery.  He saw ENT at [facility] and was told his swallowing was bad and its a wonder I get my pills down.  They wanted him to go to [facility].    ALLERGIES:   Allergies  Allergen Reactions   Bee Venom Swelling   Vicodin [Hydrocodone -Acetaminophen ] Nausea And Vomiting    CURRENT MEDICATIONS:  Outpatient Encounter Medications as of 11/08/2023  Medication Sig   acetaminophen  (TYLENOL ) 500 MG tablet Take 1,000 mg by mouth every 8 (eight) hours as needed for moderate pain.   albuterol  (PROVENTIL  HFA;VENTOLIN  HFA) 108 (90 BASE) MCG/ACT inhaler Inhale 2 puffs into the lungs every 6 (six) hours as needed for wheezing or shortness of breath.    amLODipine  (NORVASC ) 10 MG tablet Take 10 mg by mouth daily.   buPROPion  (WELLBUTRIN  SR) 150 MG 12 hr tablet Take 150 mg by mouth 2 (two) times daily.   chlorhexidine  (PERIDEX ) 0.12 % solution Use as directed 15 mLs in the mouth or throat 2 (two) times daily.   COVID-19 mRNA bivalent vaccine, Pfizer, (PFIZER COVID-19 VAC BIVALENT) injection Inject into the muscle.   COVID-19 mRNA Vac-TriS, Pfizer, (PFIZER-BIONT COVID-19 VAC-TRIS) SUSP injection Inject into the muscle.   COVID-19 mRNA vaccine, Pfizer, (COMIRNATY ) syringe Inject into the muscle.   cyclobenzaprine  (FLEXERIL ) 10 MG tablet Take 1 tablet (10 mg total) by mouth 3 (three) times daily as needed for muscle spasms.   gabapentin  (NEURONTIN ) 300 MG capsule Take 300-600 mg by mouth See admin instructions. Take 1 capsule (300 mg) by mouth in the morning, take 1 capsule (300 mg) by mouth at noon & take 3 capsules (900 mg) by mouth at night.   hydrOXYzine  (ATARAX /VISTARIL ) 10 MG tablet Take 10 mg by mouth 3 (three) times daily as needed for anxiety.   ipratropium (ATROVENT )  0.02 % nebulizer solution Take 0.5 mg by nebulization 4 (four) times daily as needed for wheezing or shortness of breath.    ipratropium (ATROVENT ) 0.06 % nasal spray Place 1 spray into both nostrils 4 (four) times daily.   levothyroxine  (SYNTHROID ) 112 MCG tablet Take 112 mcg by mouth daily before breakfast.   lisinopril  (PRINIVIL ,ZESTRIL ) 20 MG tablet Take 20 mg by mouth daily.   loratadine  (CLARITIN ) 10 MG tablet Take 10 mg by mouth daily.   Magnesium  Oxide 420 MG TABS Take 210 mg by mouth daily.   meloxicam (MOBIC) 7.5 MG tablet Take 7.5 mg by mouth 2 (two) times daily as needed (back spasms).    pantoprazole  (PROTONIX ) 40 MG tablet Take 40 mg by mouth in the morning and at bedtime.    sodium chloride  (OCEAN) 0.65 %  SOLN nasal spray Place 1 spray into both nostrils in the morning, at noon, and at bedtime.   SODIUM CHLORIDE  PO Take 125 g by mouth daily.   sodium fluoride  (FLUORISHIELD) 1.1 % GEL dental gel Place 1 application onto teeth in the morning and at bedtime.   tamsulosin  (FLOMAX ) 0.4 MG CAPS capsule Take 0.8 mg by mouth at bedtime.    [DISCONTINUED] ferrous sulfate  325 (65 FE) MG tablet Take 325 mg by mouth every other day.   [DISCONTINUED] oxyCODONE  10 MG TABS Take 1 tablet (10 mg total) by mouth every 6 (six) hours as needed for severe pain ((score 7 to 10)).   No facility-administered encounter medications on file as of 11/08/2023.    Objective:   PHYSICAL EXAMINATION:    VITALS:   Vitals:   11/08/23 1307  BP: (!) 146/71  Pulse: 85  SpO2: 94%  Weight: 132 lb 12.8 oz (60.2 kg)  Height: 5' 9 (1.753 m)    GEN:  Normal appears male in no acute distress.  Appears stated age. HEENT:  Normocephalic, atraumatic. The mucous membranes are moist. The superficial temporal arteries are without ropiness or tenderness. Cardiovascular: Regular rate and rhythm. Lungs: Clear to auscultation bilaterally. Neck/Heme: There are no carotid bruits noted bilaterally.  Chin is on the chest  and head is tilted to the left (ear approximates shoulder) and rotated right.  Patient is able to somewhat lift the neck on his own, but ability to extend the neck is very limited.  He also has limited rotation left and right, but he is able to do it somewhat.  NEUROLOGICAL: Orientation:  The patient is alert and oriented x 3.   Cranial nerves: There is good facial symmetry.  Extraocular muscles are intact and visual fields are full to confrontational testing. Speech is fluent and clear, but slightly bulbar in quality due to history of prior trach and radiation. Soft palate rises symmetrically and there is no tongue deviation. Hearing is intact to conversational tone. Tone: Tone is good throughout. Sensation: Sensation is intact to light touch and pinprick throughout (facial, trunk, extremities). Vibration is intact at the bilateral big toe. There is no extinction with double simultaneous stimulation. There is no sensory dermatomal level identified. Coordination:  The patient has no difficulty with RAM's or FNF bilaterally. Motor: Strength is 5/5 in the bilateral upper and lower extremities.  Shoulder shrug is equal and symmetric. There is no pronator drift.  There are no fasciculations noted. DTR's: Deep tendon reflexes are 3/4 at the bilateral biceps, triceps, brachioradialis, patella and achilles.  There are cross adductor reflexes.  There is no ankle clonus.   Gait and Station: The patient is able to ambulate without difficulty. The patient is able to heel toe walk without any difficulty.  He walks with good armswing bilaterally.   Total time spent on today's visit was  60 minutes, including both face-to-face time and nonface-to-face time.  Time included that spent on review of records (prior notes available to me/labs/imaging if pertinent), discussing treatment and goals, answering patient's questions and coordinating care.   Cc:  Administration, [employer]

## 2023-11-08 ENCOUNTER — Ambulatory Visit (INDEPENDENT_AMBULATORY_CARE_PROVIDER_SITE_OTHER): Admitting: Neurology

## 2023-11-08 ENCOUNTER — Encounter: Payer: Self-pay | Admitting: Neurology

## 2023-11-08 VITALS — BP 146/71 | HR 85 | Ht 69.0 in | Wt 132.8 lb

## 2023-11-08 DIAGNOSIS — R292 Abnormal reflex: Secondary | ICD-10-CM

## 2023-11-08 DIAGNOSIS — M436 Torticollis: Secondary | ICD-10-CM | POA: Diagnosis not present

## 2023-11-08 DIAGNOSIS — G959 Disease of spinal cord, unspecified: Secondary | ICD-10-CM | POA: Diagnosis not present

## 2023-11-08 NOTE — Patient Instructions (Addendum)
 The ENT doctors name we discussed is Dr. Okey  Please put him on the botox schedule for 11/7.

## 2023-11-09 ENCOUNTER — Encounter (HOSPITAL_BASED_OUTPATIENT_CLINIC_OR_DEPARTMENT_OTHER): Payer: Self-pay | Admitting: Emergency Medicine

## 2023-11-09 ENCOUNTER — Emergency Department (HOSPITAL_BASED_OUTPATIENT_CLINIC_OR_DEPARTMENT_OTHER)

## 2023-11-09 ENCOUNTER — Other Ambulatory Visit: Payer: Self-pay

## 2023-11-09 ENCOUNTER — Emergency Department (HOSPITAL_BASED_OUTPATIENT_CLINIC_OR_DEPARTMENT_OTHER)
Admission: EM | Admit: 2023-11-09 | Discharge: 2023-11-09 | Disposition: A | Discharge status: Home / Self Care | Attending: Emergency Medicine | Admitting: Emergency Medicine

## 2023-11-09 DIAGNOSIS — Z8589 Personal history of malignant neoplasm of other organs and systems: Secondary | ICD-10-CM | POA: Diagnosis not present

## 2023-11-09 DIAGNOSIS — R202 Paresthesia of skin: Secondary | ICD-10-CM | POA: Diagnosis present

## 2023-11-09 DIAGNOSIS — J449 Chronic obstructive pulmonary disease, unspecified: Secondary | ICD-10-CM | POA: Insufficient documentation

## 2023-11-09 LAB — BASIC METABOLIC PANEL WITH GFR
Anion gap: 14 (ref 5–15)
BUN: 19 mg/dL (ref 8–23)
CO2: 26 mmol/L (ref 22–32)
Calcium: 9.6 mg/dL (ref 8.9–10.3)
Chloride: 102 mmol/L (ref 98–111)
Creatinine, Ser: 0.89 mg/dL (ref 0.61–1.24)
GFR, Estimated: >60 mL/min (ref >60)
Glucose, Bld: 125 mg/dL — ABNORMAL HIGH (ref 70–99)
Potassium: 4.1 mmol/L (ref 3.5–5.1)
Sodium: 142 mmol/L (ref 135–145)

## 2023-11-09 LAB — CBC
HCT: 41.8 % (ref 39.0–52.0)
Hemoglobin: 13.7 g/dL (ref 13.0–17.0)
MCH: 32.2 pg (ref 26.0–34.0)
MCHC: 32.8 g/dL (ref 30.0–36.0)
MCV: 98.4 fL (ref 80.0–100.0)
Platelets: 202 K/uL (ref 150–400)
RBC: 4.25 MIL/uL (ref 4.22–5.81)
RDW: 16 % — ABNORMAL HIGH (ref 11.5–15.5)
WBC: 7.8 K/uL (ref 4.0–10.5)
nRBC: 0 % (ref 0.0–0.2)

## 2023-11-09 LAB — CBG MONITORING, ED: Glucose-Capillary: 126 mg/dL — ABNORMAL HIGH (ref 70–99)

## 2023-11-09 NOTE — ED Notes (Signed)
 Pt Spo2 has dropped down in the upper 80s a few times but recovered back to the low 90s. I spoke with him about placing on him on low dose o2. He refused at this time. Says he doesn't want anything else hooked up to him at this time. I told him we will continue to monitor his levels.

## 2023-11-09 NOTE — ED Provider Notes (Signed)
 [place] [facility] DEPARTMENT AT [facility] Provider Note   CSN: [id number] Arrival date & time: 11/09/23  1116     Patient presents with: Numbness and Weakness   George Nielsen is a 68 y.o. male.   HPI 68 year old male history of COPD, head and neck cancer, presents today complaining that he had a stroke this week.  He reports that he has had some numbness on the left side since Monday or Tuesday.  He reports that he has been dropping things a lot.  He fell and struck the bedside table with his head on this side.  This occurred after the numbness.  He denies prior stroke.  He denies blood thinners.  He denies fever or chills, chest pain, or dyspnea above baseline.    Prior to Admission medications   Medication Sig Start Date End Date Taking? Authorizing Provider  acetaminophen  (TYLENOL ) 500 MG tablet Take 1,000 mg by mouth every 8 (eight) hours as needed for moderate pain.    [provider]  albuterol  (PROVENTIL  HFA;VENTOLIN  HFA) 108 (90 BASE) MCG/ACT inhaler Inhale 2 puffs into the lungs every 6 (six) hours as needed for wheezing or shortness of breath.     [provider]  amLODipine  (NORVASC ) 10 MG tablet Take 10 mg by mouth daily.    [provider]  buPROPion  (WELLBUTRIN  SR) 150 MG 12 hr tablet Take 150 mg by mouth 2 (two) times daily.    [provider]  chlorhexidine  (PERIDEX ) 0.12 % solution Use as directed 15 mLs in the mouth or throat 2 (two) times daily.    [provider]  COVID-19 mRNA bivalent vaccine, Pfizer, (PFIZER COVID-19 VAC BIVALENT) injection Inject into the muscle. 01/03/21   Luiz Channel, MD  COVID-19 mRNA Vac-TriS, Pfizer, (PFIZER-BIONT COVID-19 VAC-TRIS) SUSP injection Inject into the muscle. 08/04/20   Luiz Channel, MD  COVID-19 mRNA vaccine, Pfizer, (COMIRNATY ) syringe Inject into the muscle. 01/31/23   Luiz Channel, MD  cyclobenzaprine  (FLEXERIL ) 10 MG tablet Take 1 tablet (10 mg total) by  mouth 3 (three) times daily as needed for muscle spasms. 05/28/20   Lanis Pupa, MD  gabapentin  (NEURONTIN ) 300 MG capsule Take 300-600 mg by mouth See admin instructions. Take 1 capsule (300 mg) by mouth in the morning, take 1 capsule (300 mg) by mouth at noon & take 3 capsules (900 mg) by mouth at night.    [provider]  hydrOXYzine  (ATARAX /VISTARIL ) 10 MG tablet Take 10 mg by mouth 3 (three) times daily as needed for anxiety.    [provider]  ipratropium (ATROVENT ) 0.02 % nebulizer solution Take 0.5 mg by nebulization 4 (four) times daily as needed for wheezing or shortness of breath.     [provider]  ipratropium (ATROVENT ) 0.06 % nasal spray Place 1 spray into both nostrils 4 (four) times daily.    [provider]  levothyroxine  (SYNTHROID ) 112 MCG tablet Take 112 mcg by mouth daily before breakfast.    [provider]  lisinopril  (PRINIVIL ,ZESTRIL ) 20 MG tablet Take 20 mg by mouth daily.    [provider]  loratadine  (CLARITIN ) 10 MG tablet Take 10 mg by mouth daily.    [provider]  Magnesium  Oxide 420 MG TABS Take 210 mg by mouth daily.    [provider]  meloxicam (MOBIC) 7.5 MG tablet Take 7.5 mg by mouth 2 (two) times daily as needed (back spasms).     [provider]  pantoprazole  (PROTONIX ) 40 MG  tablet Take 40 mg by mouth in the morning and at bedtime.     [provider]  sodium chloride  (OCEAN) 0.65 % SOLN nasal spray Place 1 spray into both nostrils in the morning, at noon, and at bedtime.    [provider]  SODIUM CHLORIDE  PO Take 125 g by mouth daily.    [provider]  sodium fluoride  (FLUORISHIELD) 1.1 % GEL dental gel Place 1 application onto teeth in the morning and at bedtime.    [provider]  tamsulosin  (FLOMAX ) 0.4 MG CAPS capsule Take 0.8 mg by mouth at bedtime.     [provider]    Allergies: Bee venom and Vicodin  [hydrocodone -acetaminophen ]    Review of Systems  Updated Vital Signs BP (!) 153/80   Pulse 77   Temp 98.4 F (36.9 C) (Oral)   Resp 15   SpO2 90%   Physical Exam Vitals reviewed.  HENT:     Head:     Comments: Abrasion left temple area    Right Ear: External ear normal.     Left Ear: External ear normal.     Nose: Nose normal.     Mouth/Throat:     Pharynx: Oropharynx is clear.     Comments: Tongue may deviate slightly to right Eyes:     Extraocular Movements: Extraocular movements intact.     Pupils: Pupils are equal, round, and reactive to light.  Cardiovascular:     Rate and Rhythm: Normal rate and regular rhythm.     Pulses: Normal pulses.  Pulmonary:     Effort: Pulmonary effort is normal.  Abdominal:     General: Abdomen is flat.     Palpations: Abdomen is soft.  Musculoskeletal:        General: Normal range of motion.     Cervical back: Normal range of motion.  Skin:    General: Skin is warm and dry.     Capillary Refill: Capillary refill takes less than 2 seconds.  Neurological:     Mental Status: He is alert.     Comments: Decreased sensation left side of face, left arm left leg No palmar drift, Leg drift, or decreased grip strength  Psychiatric:        Mood and Affect: Mood normal.     (all labs ordered are listed, but only abnormal results are displayed) Labs Reviewed  CBC - Abnormal; Notable for the following components:      Result Value   RDW 16.0 (*)    All other components within normal limits  BASIC METABOLIC PANEL WITH GFR - Abnormal; Notable for the following components:   Glucose, Bld 125 (*)    All other components within normal limits  CBG MONITORING, ED - Abnormal; Notable for the following components:   Glucose-Capillary 126 (*)    All other components within normal limits    EKG: None  Radiology: CT Cervical Spine Wo Contrast Result Date: 11/09/2023 EXAM: CT CERVICAL SPINE WITHOUT CONTRAST 11/09/2023 12:41:06 PM TECHNIQUE:  CT of the cervical spine was performed without the administration of intravenous contrast. Multiplanar reformatted images are provided for review. Automated exposure control, iterative reconstruction, and/or weight based adjustment of the mA/kV was utilized to reduce the radiation dose to as low as reasonably achievable. COMPARISON: MRI of the cervical spine 11/20/2019, cervical spine radiographs 03/30/2021. CLINICAL HISTORY: Neck trauma (Age >= 65y). Fall last night, denies LOC. On blood thinners. FINDINGS: CERVICAL SPINE: BONES AND ALIGNMENT: Solid ACDF at  C3-4 stable. Grade 1 degenerative anterolisthesis is again noted at C2-3, C3-4 and at C7-T1. Reversal of the normal cervical lordosis is chronic. DEGENERATIVE CHANGES: Moderate right foraminal stenosis is present at C3-4. SOFT TISSUES: No prevertebral soft tissue swelling. IMPRESSION: 1. No acute abnormality of the cervical spine related to the reported neck trauma. 2. Solid ACDF at C3-4 stable. 3. Grade 1 degenerative anterolisthesis at C2-3, C3-4, and C7-T1. 4. Moderate right foraminal stenosis at C3-4. Electronically signed by: Lonni Necessary MD 11/09/2023 01:00 PM EDT RP Workstation: [id number]   CT Head Wo Contrast Result Date: 11/09/2023 EXAM: CT HEAD WITHOUT CONTRAST 11/09/2023 12:41:06 PM TECHNIQUE: CT of the head was performed without the administration of intravenous contrast. Automated exposure control, iterative reconstruction, and/or weight based adjustment of the mA/kV was utilized to reduce the radiation dose to as low as reasonably achievable. COMPARISON: None available. CLINICAL HISTORY: Neuro deficit, acute, stroke suspected. Fall last night, denies LOC. On blood thinners. FINDINGS: BRAIN AND VENTRICLES: No acute hemorrhage. Gray-white differentiation is preserved. No hydrocephalus. No extra-axial collection. No mass effect or midline shift. Mild atrophy and white matter changes are present. ORBITS: No acute abnormality. SINUSES: No  acute abnormality. SOFT TISSUES AND SKULL: No acute soft tissue abnormality. No skull fracture. VASCULATURE: Atherosclerotic calcifications are present in the cavernous carotid arteries bilaterally. No hyperdense vessel is present. IMPRESSION: 1. No acute intracranial abnormality. 2. Mild atrophy and white matter changes. 3. Atherosclerotic calcifications in the cavernous carotid arteries bilaterally. No hyperdense vessel. Electronically signed by: Lonni Necessary MD 11/09/2023 12:56 PM EDT RP Workstation: [id number]     Procedures   Medications Ordered in the ED - No data to display                                  Medical Decision Making Amount and/or Complexity of Data Reviewed Labs: ordered. Radiology: ordered.   68 year old male presents today complaining of left-sided numbness since Tuesday. Patient has a history of head and neck cancer, degenerative arthritis of cervical spine, chronic cervical dystonia.  He initially is very clear that this has been present for at least 4 days.  He was seen by Dr. Evonnie yesterday.  He is with [facility] neurology.  He was seen in referral for his dystonia.  She documents normal sensation.  Today he has a normal motor exam but has not decreased sensation throughout the left side of his body.  Head CT does not show anything abnormal.  When I discussed with him that I would suspect this to show something after 4 to 5 days, he states that maybe this began last night.  At this point, he was offered transfer and admission for further evaluation with MRI.  He and his significant other do not wish to proceed with this at this time.  I have given clear return instructions for any change or worsening neurological symptoms.  He is instructed to follow-up with his neurologist this week. Patient appears hemodynamically stable for discharge.  We have discussed return precautions and they voiced understanding    Final diagnoses:  Paresthesia    ED Discharge Orders      None          Levander Houston, MD 11/09/23 [phone]

## 2023-11-09 NOTE — ED Notes (Signed)
 Pt discharged home and given discharge paperwork. Opportunities given for questions. Pt verbalizes understanding. PIV removed x1. Bethena Powell SAUNDERS , RN

## 2023-11-09 NOTE — ED Triage Notes (Signed)
 Pt caox4, ambulatory stating I had a stroke this week. Pt reports since Monday or Tuesday he has been dropping things a lot. Multiple falls last night. L arm numbness since last night, unable to specify time.

## 2023-11-09 NOTE — Discharge Instructions (Signed)
 Please call your neurologist on Monday. Return if you are having any worsening symptoms.

## 2023-11-11 ENCOUNTER — Telehealth: Payer: Self-pay | Admitting: Neurology

## 2023-11-11 ENCOUNTER — Telehealth: Payer: Self-pay

## 2023-11-11 ENCOUNTER — Other Ambulatory Visit: Payer: Self-pay

## 2023-11-11 DIAGNOSIS — G959 Disease of spinal cord, unspecified: Secondary | ICD-10-CM

## 2023-11-11 NOTE — Telephone Encounter (Signed)
 Pt. Cld said Friday night left side of body went numb and he went to ED on Sat.,would like to know next steps on [place] [facility] approval

## 2023-11-15 ENCOUNTER — Encounter: Payer: Self-pay | Admitting: Neurology

## 2023-11-18 ENCOUNTER — Other Ambulatory Visit: Payer: Self-pay

## 2023-11-18 DIAGNOSIS — G959 Disease of spinal cord, unspecified: Secondary | ICD-10-CM

## 2023-11-18 NOTE — Telephone Encounter (Signed)
 Patients MRI order is for Brain W and w/o. Just to verify this is the one that you wanted patient had a question wether this is correct

## 2023-11-19 NOTE — Telephone Encounter (Signed)
 error

## 2023-11-29 ENCOUNTER — Other Ambulatory Visit

## 2023-11-29 ENCOUNTER — Ambulatory Visit
Admission: RE | Admit: 2023-11-29 | Discharge: 2023-11-29 | Disposition: A | Discharge status: Home / Self Care | Source: Ambulatory Visit | Attending: Neurology | Admitting: Neurology

## 2023-11-29 DIAGNOSIS — G959 Disease of spinal cord, unspecified: Secondary | ICD-10-CM

## 2023-11-29 MED ORDER — GADOPICLENOL 0.5 MMOL/ML IV SOLN
6.0000 mL | Freq: Once | INTRAVENOUS | Status: AC | PRN
  Administered 2023-11-29: 6 mL via INTRAVENOUS

## 2023-12-05 ENCOUNTER — Other Ambulatory Visit

## 2023-12-09 ENCOUNTER — Ambulatory Visit: Payer: Self-pay | Admitting: Neurology

## 2023-12-10 ENCOUNTER — Other Ambulatory Visit: Payer: Self-pay

## 2023-12-10 DIAGNOSIS — G959 Disease of spinal cord, unspecified: Secondary | ICD-10-CM

## 2023-12-10 DIAGNOSIS — M48061 Spinal stenosis, lumbar region without neurogenic claudication: Secondary | ICD-10-CM

## 2023-12-16 ENCOUNTER — Telehealth: Payer: Self-pay | Admitting: Neurology

## 2023-12-16 NOTE — Telephone Encounter (Signed)
 Patient called and states that the [place] office states that we need to do a [place] auth for the patient to be able to go to see them

## 2023-12-17 NOTE — Telephone Encounter (Signed)
 Called patient and sent to  Dr. Katrina in [place] I have also started a Referral with the [facility]

## 2023-12-30 ENCOUNTER — Telehealth: Payer: Self-pay | Admitting: Neurology

## 2023-12-30 NOTE — Telephone Encounter (Signed)
 Pt called in this morning and he would like a return call back. Thanks

## 2024-01-01 NOTE — Telephone Encounter (Signed)
 Patient called and he spoke to ENT and PCP and has decided with his COPD and other issues he will not take the referral to the neurosurgery

## 2024-01-20 ENCOUNTER — Telehealth: Payer: Self-pay | Admitting: Neurology

## 2024-01-20 NOTE — Telephone Encounter (Signed)
 Please call the patient.   He has significant cervical stenosis and see that he declined neurosx referral b/c he didn't want more sx.  However, I am not convinced that the posture of the neck is all dystonia, as this started after his last neck surgery and really wanted a neurosx opinion as he had botox and didn't find it effective.  He has an upcoming appt for botox here and think he should still get that neurosx opinion if he wants to retry the botox.  Is he agreeable?

## 2024-01-20 NOTE — Telephone Encounter (Signed)
 Patient feels that the Botox helped tremendously and that he does want to do it. The doctor that recommended against getting sx was patients ENT doctor that he saw for his throat cancer said they felt it would cause more harm than good to do SX to try the Botox

## 2024-01-23 NOTE — Telephone Encounter (Signed)
 Patient really wants to do Botox and says it really helps him. Do you want to do Botox for him?

## 2024-01-29 ENCOUNTER — Inpatient Hospital Stay (HOSPITAL_COMMUNITY)

## 2024-01-29 ENCOUNTER — Emergency Department (HOSPITAL_COMMUNITY)

## 2024-01-29 ENCOUNTER — Inpatient Hospital Stay (HOSPITAL_COMMUNITY)
Admission: EM | Admit: 2024-01-29 | Discharge: 2024-02-06 | DRG: 189 | Disposition: A | Discharge status: Hospice | Attending: Internal Medicine | Admitting: Internal Medicine

## 2024-01-29 DIAGNOSIS — G9340 Encephalopathy, unspecified: Secondary | ICD-10-CM | POA: Diagnosis not present

## 2024-01-29 DIAGNOSIS — T17928A Food in respiratory tract, part unspecified causing other injury, initial encounter: Secondary | ICD-10-CM | POA: Diagnosis present

## 2024-01-29 DIAGNOSIS — J9601 Acute respiratory failure with hypoxia: Secondary | ICD-10-CM | POA: Diagnosis present

## 2024-01-29 DIAGNOSIS — J9621 Acute and chronic respiratory failure with hypoxia: Secondary | ICD-10-CM | POA: Diagnosis present

## 2024-01-29 DIAGNOSIS — M4802 Spinal stenosis, cervical region: Secondary | ICD-10-CM | POA: Diagnosis present

## 2024-01-29 DIAGNOSIS — E43 Unspecified severe protein-calorie malnutrition: Secondary | ICD-10-CM | POA: Diagnosis present

## 2024-01-29 DIAGNOSIS — J449 Chronic obstructive pulmonary disease, unspecified: Secondary | ICD-10-CM

## 2024-01-29 DIAGNOSIS — Z881 Allergy status to other antibiotic agents status: Secondary | ICD-10-CM

## 2024-01-29 DIAGNOSIS — Z9221 Personal history of antineoplastic chemotherapy: Secondary | ICD-10-CM

## 2024-01-29 DIAGNOSIS — Z66 Do not resuscitate: Secondary | ICD-10-CM | POA: Diagnosis present

## 2024-01-29 DIAGNOSIS — J9622 Acute and chronic respiratory failure with hypercapnia: Secondary | ICD-10-CM | POA: Diagnosis present

## 2024-01-29 DIAGNOSIS — Z7951 Long term (current) use of inhaled steroids: Secondary | ICD-10-CM

## 2024-01-29 DIAGNOSIS — Z681 Body mass index (BMI) 19 or less, adult: Secondary | ICD-10-CM

## 2024-01-29 DIAGNOSIS — Z79899 Other long term (current) drug therapy: Secondary | ICD-10-CM

## 2024-01-29 DIAGNOSIS — J439 Emphysema, unspecified: Secondary | ICD-10-CM | POA: Diagnosis present

## 2024-01-29 DIAGNOSIS — R1310 Dysphagia, unspecified: Secondary | ICD-10-CM | POA: Diagnosis present

## 2024-01-29 DIAGNOSIS — D638 Anemia in other chronic diseases classified elsewhere: Secondary | ICD-10-CM | POA: Diagnosis present

## 2024-01-29 DIAGNOSIS — R0603 Acute respiratory distress: Principal | ICD-10-CM

## 2024-01-29 DIAGNOSIS — E46 Unspecified protein-calorie malnutrition: Secondary | ICD-10-CM

## 2024-01-29 DIAGNOSIS — G8929 Other chronic pain: Secondary | ICD-10-CM | POA: Diagnosis present

## 2024-01-29 DIAGNOSIS — Z5309 Procedure and treatment not carried out because of other contraindication: Secondary | ICD-10-CM | POA: Diagnosis present

## 2024-01-29 DIAGNOSIS — J9602 Acute respiratory failure with hypercapnia: Secondary | ICD-10-CM | POA: Diagnosis not present

## 2024-01-29 DIAGNOSIS — Z515 Encounter for palliative care: Secondary | ICD-10-CM | POA: Diagnosis not present

## 2024-01-29 DIAGNOSIS — R64 Cachexia: Secondary | ICD-10-CM | POA: Diagnosis present

## 2024-01-29 DIAGNOSIS — Z87891 Personal history of nicotine dependence: Secondary | ICD-10-CM

## 2024-01-29 DIAGNOSIS — Z79890 Hormone replacement therapy (postmenopausal): Secondary | ICD-10-CM

## 2024-01-29 DIAGNOSIS — Z85810 Personal history of malignant neoplasm of tongue: Secondary | ICD-10-CM

## 2024-01-29 DIAGNOSIS — Z8701 Personal history of pneumonia (recurrent): Secondary | ICD-10-CM

## 2024-01-29 DIAGNOSIS — Z789 Other specified health status: Secondary | ICD-10-CM | POA: Diagnosis not present

## 2024-01-29 DIAGNOSIS — I272 Pulmonary hypertension, unspecified: Secondary | ICD-10-CM | POA: Diagnosis present

## 2024-01-29 DIAGNOSIS — I10 Essential (primary) hypertension: Secondary | ICD-10-CM | POA: Diagnosis present

## 2024-01-29 DIAGNOSIS — K219 Gastro-esophageal reflux disease without esophagitis: Secondary | ICD-10-CM | POA: Diagnosis present

## 2024-01-29 DIAGNOSIS — G4733 Obstructive sleep apnea (adult) (pediatric): Secondary | ICD-10-CM | POA: Diagnosis present

## 2024-01-29 DIAGNOSIS — Z888 Allergy status to other drugs, medicaments and biological substances status: Secondary | ICD-10-CM

## 2024-01-29 DIAGNOSIS — E7220 Disorder of urea cycle metabolism, unspecified: Secondary | ICD-10-CM

## 2024-01-29 DIAGNOSIS — Z885 Allergy status to narcotic agent status: Secondary | ICD-10-CM

## 2024-01-29 DIAGNOSIS — E039 Hypothyroidism, unspecified: Secondary | ICD-10-CM | POA: Diagnosis present

## 2024-01-29 DIAGNOSIS — Z981 Arthrodesis status: Secondary | ICD-10-CM

## 2024-01-29 DIAGNOSIS — E8809 Other disorders of plasma-protein metabolism, not elsewhere classified: Secondary | ICD-10-CM | POA: Diagnosis present

## 2024-01-29 DIAGNOSIS — G9341 Metabolic encephalopathy: Secondary | ICD-10-CM | POA: Diagnosis present

## 2024-01-29 DIAGNOSIS — E8729 Other acidosis: Secondary | ICD-10-CM | POA: Diagnosis present

## 2024-01-29 DIAGNOSIS — Z5329 Procedure and treatment not carried out because of patient's decision for other reasons: Secondary | ICD-10-CM | POA: Diagnosis not present

## 2024-01-29 DIAGNOSIS — Z91030 Bee allergy status: Secondary | ICD-10-CM

## 2024-01-29 DIAGNOSIS — Z923 Personal history of irradiation: Secondary | ICD-10-CM

## 2024-01-29 DIAGNOSIS — E162 Hypoglycemia, unspecified: Secondary | ICD-10-CM | POA: Diagnosis not present

## 2024-01-29 DIAGNOSIS — Z9109 Other allergy status, other than to drugs and biological substances: Secondary | ICD-10-CM

## 2024-01-29 DIAGNOSIS — Z7189 Other specified counseling: Secondary | ICD-10-CM | POA: Diagnosis not present

## 2024-01-29 DIAGNOSIS — R4589 Other symptoms and signs involving emotional state: Secondary | ICD-10-CM | POA: Diagnosis not present

## 2024-01-29 DIAGNOSIS — G243 Spasmodic torticollis: Secondary | ICD-10-CM | POA: Diagnosis present

## 2024-01-29 DIAGNOSIS — R0902 Hypoxemia: Secondary | ICD-10-CM | POA: Diagnosis present

## 2024-01-29 DIAGNOSIS — Z8249 Family history of ischemic heart disease and other diseases of the circulatory system: Secondary | ICD-10-CM

## 2024-01-29 LAB — BLOOD GAS, VENOUS
Acid-Base Excess: 3.6 mmol/L — ABNORMAL HIGH (ref 0.0–2.0)
Bicarbonate: 32.2 mmol/L — ABNORMAL HIGH (ref 20.0–28.0)
Drawn by: 66519
O2 Saturation: 74.8 %
Patient temperature: 36.6
pCO2, Ven: 66 mmHg — ABNORMAL HIGH (ref 44–60)
pH, Ven: 7.3 (ref 7.25–7.43)
pO2, Ven: 41 mmHg (ref 32–45)

## 2024-01-29 LAB — I-STAT CHEM 8, ED
BUN: 29 mg/dL — ABNORMAL HIGH (ref 8–23)
Calcium, Ion: 1.13 mmol/L — ABNORMAL LOW (ref 1.15–1.40)
Chloride: 101 mmol/L (ref 98–111)
Creatinine, Ser: 1.2 mg/dL (ref 0.61–1.24)
Glucose, Bld: 108 mg/dL — ABNORMAL HIGH (ref 70–99)
HCT: 40 % (ref 39.0–52.0)
Hemoglobin: 13.6 g/dL (ref 13.0–17.0)
Potassium: 4.1 mmol/L (ref 3.5–5.1)
Sodium: 140 mmol/L (ref 135–145)
TCO2: 28 mmol/L (ref 22–32)

## 2024-01-29 LAB — CBC WITH DIFFERENTIAL/PLATELET
Abs Immature Granulocytes: 0.08 K/uL — ABNORMAL HIGH (ref 0.00–0.07)
Basophils Absolute: 0 K/uL (ref 0.0–0.1)
Basophils Relative: 0 %
Eosinophils Absolute: 0 K/uL (ref 0.0–0.5)
Eosinophils Relative: 0 %
HCT: 38.5 % — ABNORMAL LOW (ref 39.0–52.0)
Hemoglobin: 12.4 g/dL — ABNORMAL LOW (ref 13.0–17.0)
Immature Granulocytes: 1 %
Lymphocytes Relative: 3 %
Lymphs Abs: 0.2 K/uL — ABNORMAL LOW (ref 0.7–4.0)
MCH: 31.4 pg (ref 26.0–34.0)
MCHC: 32.2 g/dL (ref 30.0–36.0)
MCV: 97.5 fL (ref 80.0–100.0)
Monocytes Absolute: 0.2 K/uL (ref 0.1–1.0)
Monocytes Relative: 2 %
Neutro Abs: 7.8 K/uL — ABNORMAL HIGH (ref 1.7–7.7)
Neutrophils Relative %: 94 %
Platelets: 169 K/uL (ref 150–400)
RBC: 3.95 MIL/uL — ABNORMAL LOW (ref 4.22–5.81)
RDW: 15.1 % (ref 11.5–15.5)
WBC: 8.3 K/uL (ref 4.0–10.5)
nRBC: 0 % (ref 0.0–0.2)

## 2024-01-29 LAB — GLUCOSE, CAPILLARY
Glucose-Capillary: 91 mg/dL (ref 70–99)
Glucose-Capillary: 92 mg/dL (ref 70–99)
Glucose-Capillary: 88 mg/dL (ref 70–99)

## 2024-01-29 LAB — I-STAT ARTERIAL BLOOD GAS, ED
Acid-Base Excess: 2 mmol/L (ref 0.0–2.0)
Bicarbonate: 30.6 mmol/L — ABNORMAL HIGH (ref 20.0–28.0)
Calcium, Ion: 1.19 mmol/L (ref 1.15–1.40)
HCT: 35 % — ABNORMAL LOW (ref 39.0–52.0)
Hemoglobin: 11.9 g/dL — ABNORMAL LOW (ref 13.0–17.0)
O2 Saturation: 86 %
Potassium: 4.1 mmol/L (ref 3.5–5.1)
Sodium: 139 mmol/L (ref 135–145)
TCO2: 33 mmol/L — ABNORMAL HIGH (ref 22–32)
pCO2 arterial: 64.6 mmHg — ABNORMAL HIGH (ref 32–48)
pH, Arterial: 7.283 — ABNORMAL LOW (ref 7.35–7.45)
pO2, Arterial: 59 mmHg — ABNORMAL LOW (ref 83–108)

## 2024-01-29 LAB — POCT I-STAT 7, (LYTES, BLD GAS, ICA,H+H)
Acid-Base Excess: 1 mmol/L (ref 0.0–2.0)
Bicarbonate: 28.7 mmol/L — ABNORMAL HIGH (ref 20.0–28.0)
Calcium, Ion: 1.2 mmol/L (ref 1.15–1.40)
HCT: 35 % — ABNORMAL LOW (ref 39.0–52.0)
Hemoglobin: 11.9 g/dL — ABNORMAL LOW (ref 13.0–17.0)
O2 Saturation: 95 %
Patient temperature: 98.6
Potassium: 3.9 mmol/L (ref 3.5–5.1)
Sodium: 141 mmol/L (ref 135–145)
TCO2: 30 mmol/L (ref 22–32)
pCO2 arterial: 57.1 mmHg — ABNORMAL HIGH (ref 32–48)
pH, Arterial: 7.309 — ABNORMAL LOW (ref 7.35–7.45)
pO2, Arterial: 86 mmHg (ref 83–108)

## 2024-01-29 LAB — I-STAT VENOUS BLOOD GAS, ED
Acid-Base Excess: 0 mmol/L (ref 0.0–2.0)
Bicarbonate: 28.9 mmol/L — ABNORMAL HIGH (ref 20.0–28.0)
Calcium, Ion: 1.14 mmol/L — ABNORMAL LOW (ref 1.15–1.40)
HCT: 40 % (ref 39.0–52.0)
Hemoglobin: 13.6 g/dL (ref 13.0–17.0)
O2 Saturation: 51 %
Potassium: 4.2 mmol/L (ref 3.5–5.1)
Sodium: 141 mmol/L (ref 135–145)
TCO2: 31 mmol/L (ref 22–32)
pCO2, Ven: 66.9 mmHg — ABNORMAL HIGH (ref 44–60)
pH, Ven: 7.243 — ABNORMAL LOW (ref 7.25–7.43)
pO2, Ven: 33 mmHg (ref 32–45)

## 2024-01-29 LAB — COMPREHENSIVE METABOLIC PANEL WITH GFR
ALT: 14 U/L (ref 0–44)
AST: 17 U/L (ref 15–41)
Albumin: 3.1 g/dL — ABNORMAL LOW (ref 3.5–5.0)
Alkaline Phosphatase: 52 U/L (ref 38–126)
Anion gap: 11 (ref 5–15)
BUN: 27 mg/dL — ABNORMAL HIGH (ref 8–23)
CO2: 28 mmol/L (ref 22–32)
Calcium: 8.1 mg/dL — ABNORMAL LOW (ref 8.9–10.3)
Chloride: 101 mmol/L (ref 98–111)
Creatinine, Ser: 1.15 mg/dL (ref 0.61–1.24)
GFR, Estimated: >60 mL/min (ref >60)
Glucose, Bld: 112 mg/dL — ABNORMAL HIGH (ref 70–99)
Potassium: 4.2 mmol/L (ref 3.5–5.1)
Sodium: 140 mmol/L (ref 135–145)
Total Bilirubin: 0.5 mg/dL (ref 0.0–1.2)
Total Protein: 6.2 g/dL — ABNORMAL LOW (ref 6.5–8.1)

## 2024-01-29 LAB — CBC
HCT: 39.1 % (ref 39.0–52.0)
Hemoglobin: 12.7 g/dL — ABNORMAL LOW (ref 13.0–17.0)
MCH: 31.1 pg (ref 26.0–34.0)
MCHC: 32.5 g/dL (ref 30.0–36.0)
MCV: 95.8 fL (ref 80.0–100.0)
Platelets: 172 K/uL (ref 150–400)
RBC: 4.08 MIL/uL — ABNORMAL LOW (ref 4.22–5.81)
RDW: 15 % (ref 11.5–15.5)
WBC: 9 K/uL (ref 4.0–10.5)
nRBC: 0 % (ref 0.0–0.2)

## 2024-01-29 LAB — CREATININE, SERUM
Creatinine, Ser: 1.06 mg/dL (ref 0.61–1.24)
GFR, Estimated: >60 mL/min

## 2024-01-29 LAB — TSH: TSH: 9.15 u[IU]/mL — ABNORMAL HIGH (ref 0.350–4.500)

## 2024-01-29 LAB — BRAIN NATRIURETIC PEPTIDE: B Natriuretic Peptide: 192.1 pg/mL — ABNORMAL HIGH (ref 0.0–100.0)

## 2024-01-29 LAB — LIPASE, BLOOD: Lipase: 20 U/L (ref 11–51)

## 2024-01-29 LAB — MRSA NEXT GEN BY PCR, NASAL: MRSA by PCR Next Gen: NOT DETECTED

## 2024-01-29 LAB — TROPONIN I (HIGH SENSITIVITY)
Troponin I (High Sensitivity): 14 ng/L (ref <18)
Troponin I (High Sensitivity): 34 ng/L — ABNORMAL HIGH (ref <18)

## 2024-01-29 LAB — I-STAT CG4 LACTIC ACID, ED: Lactic Acid, Venous: 1 mmol/L (ref 0.5–1.9)

## 2024-01-29 LAB — PROTIME-INR
INR: 0.9 (ref 0.8–1.2)
Prothrombin Time: 13.1 s (ref 11.4–15.2)

## 2024-01-29 LAB — HIV ANTIBODY (ROUTINE TESTING W REFLEX): HIV Screen 4th Generation wRfx: NONREACTIVE

## 2024-01-29 MED ORDER — ALBUTEROL SULFATE (2.5 MG/3ML) 0.083% IN NEBU
2.5000 mg | INHALATION_SOLUTION | Freq: Four times a day (QID) | RESPIRATORY_TRACT | Status: DC
  Administered 2024-01-29 – 2024-01-31 (×7): 2.5 mg via RESPIRATORY_TRACT
  Filled 2024-01-29 (×7): qty 3

## 2024-01-29 MED ORDER — IPRATROPIUM-ALBUTEROL 0.5-2.5 (3) MG/3ML IN SOLN
3.0000 mL | Freq: Four times a day (QID) | RESPIRATORY_TRACT | Status: DC
  Filled 2024-01-29: qty 3

## 2024-01-29 MED ORDER — SODIUM CHLORIDE 0.9 % IV SOLN: 2.0000 g | INTRAVENOUS | Status: DC

## 2024-01-29 MED ORDER — SODIUM CHLORIDE 0.9 % IV SOLN
500.0000 mg | INTRAVENOUS | Status: AC
Start: 2024-01-29 — End: 2024-02-03
  Administered 2024-01-29 – 2024-01-31 (×3): 500 mg via INTRAVENOUS
  Filled 2024-01-29 (×3): qty 5

## 2024-01-29 MED ORDER — DEXAMETHASONE SOD PHOSPHATE PF 10 MG/ML IJ SOLN
5.0000 mg | Freq: Once | INTRAMUSCULAR | Status: DC
Start: 2024-01-29 — End: 2024-01-30
  Filled 2024-01-29: qty 0.5

## 2024-01-29 MED ORDER — REVEFENACIN 175 MCG/3ML IN SOLN: 175.0000 ug | Freq: Every day | RESPIRATORY_TRACT | Status: DC

## 2024-01-29 MED ORDER — LACTATED RINGERS IV SOLN: INTRAVENOUS | Status: DC

## 2024-01-29 MED ORDER — PIPERACILLIN-TAZOBACTAM 3.375 G IVPB
3.3750 g | Freq: Three times a day (TID) | INTRAVENOUS | Status: DC
  Administered 2024-01-29 – 2024-01-30 (×2): 3.375 g via INTRAVENOUS
  Filled 2024-01-29 (×3): qty 50

## 2024-01-29 MED ORDER — NALOXONE HCL 0.4 MG/ML IJ SOLN
0.4000 mg | Freq: Once | INTRAMUSCULAR | Status: AC
Start: 2024-01-29 — End: 2024-01-29
  Administered 2024-01-29: 0.4 mg via INTRAVENOUS
  Filled 2024-01-29: qty 1

## 2024-01-29 MED ORDER — PIPERACILLIN-TAZOBACTAM IN DEX 2-0.25 GM/50ML IV SOLN
2.2500 g | Freq: Three times a day (TID) | INTRAVENOUS | Status: DC
  Filled 2024-01-29: qty 50

## 2024-01-29 MED ORDER — ONDANSETRON HCL 4 MG/2ML IJ SOLN: 4.0000 mg | Freq: Four times a day (QID) | INTRAMUSCULAR | Status: DC | PRN

## 2024-01-29 MED ORDER — NALOXONE HCL 2 MG/2ML IJ SOSY
PREFILLED_SYRINGE | INTRAMUSCULAR | Status: AC
  Filled 2024-01-29: qty 2

## 2024-01-29 MED ORDER — CHLORHEXIDINE GLUCONATE CLOTH 2 % EX PADS
6.0000 | MEDICATED_PAD | Freq: Every day | CUTANEOUS | Status: DC
  Administered 2024-01-29 – 2024-02-06 (×7): 6 via TOPICAL

## 2024-01-29 MED ORDER — HEPARIN SODIUM (PORCINE) 5000 UNIT/ML IJ SOLN
5000.0000 [IU] | Freq: Three times a day (TID) | INTRAMUSCULAR | Status: DC
  Administered 2024-01-29 – 2024-01-30 (×3): 5000 [IU] via SUBCUTANEOUS
  Filled 2024-01-29 (×3): qty 1

## 2024-01-29 MED ORDER — DEXTROSE-SODIUM CHLORIDE 5-0.45 % IV SOLN: INTRAVENOUS | Status: AC

## 2024-01-29 MED ORDER — PANTOPRAZOLE SODIUM 40 MG IV SOLR
40.0000 mg | Freq: Two times a day (BID) | INTRAVENOUS | Status: DC
  Administered 2024-01-29 – 2024-02-06 (×18): 40 mg via INTRAVENOUS
  Filled 2024-01-29 (×18): qty 10

## 2024-01-29 MED ORDER — POLYETHYLENE GLYCOL 3350 17 G PO PACK: 17.0000 g | PACK | Freq: Every day | ORAL | Status: DC | PRN

## 2024-01-29 NOTE — Progress Notes (Signed)
 Patient weaned off BiPAP to 4L [place].  Patient tolerating well.  BiPAP on standby.  RT will continue to monitor.

## 2024-01-29 NOTE — ED Triage Notes (Signed)
 BIB [facility] from home, Caregiver reports LKW at 1000.  2mg  Narcan given, for periods of apnea. 500 ml bolus, 98/50, 131 CBG . 27YM Recent spinal fusion, hx of throat cancer.

## 2024-01-29 NOTE — H&P (Addendum)
 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 Patient reportedly eating breakfast this morning after taking his morning medications when he became unresponsive. EMS reported patient w/ pinpoint pupils and altered from baseline and was given Narcan with some improvement. Of note, family reports he was recently prescribed oxygen PRN for trouble breathing, especially in the morning and with exertion. Labs notable for respiratory acidosis w/ hypoxia, hypoalbuminemia, BNP 192, otherwise benign. PCCM consulted for ICU evaluation.  On exam, patient oxygenating well on HFNC 10L with SpO2 >94%. Patient did not follow commands, but localized to noxious stimuli and only opened eyes to oral suctioning and grabbed for the [place]. Food particles noted on oral inspection. Plan to admit to ICU for airway watch, place on BiPAP, start antibiotics, CTH/UA/UDS pending to eval AMS.  GOC with HCPOA, patient made DNR/DNI.   Pertinent Medical History:   Past Medical History:  Diagnosis Date   Allergy    Anemia    Anxiety    Arthritis    Cancer (HCC)    tongue and neck lymph nodes   COPD (chronic obstructive pulmonary disease) (HCC)    Dyspnea    climbing stairs, walking distance   Emphysema of lung (HCC)    GERD (gastroesophageal reflux disease)    Hepatitis C    treated   History of radiation therapy 11/10/14- 12/29/14   BOT and bilateral neck/ 70 Gy in 35 fractions to gross disease, 63 Gy in 35 fractions to high risk  nodal echelons, and 56 Gy in 35 fraction to intermediate risk nodal echelons.    Hypertension    Hypothyroidism    PNA (pneumonia)    4 times   Sleep apnea    wears CPAP sometimes per pt.   05/25/19 - not able to use at this time due to neck pain.   Thyroid  disease    take Synthroid  d/t scar tissue from radiation around thyroid    Ulcer    Significant Hospital Events: Including procedures, antibiotic start and stop dates in addition to other pertinent events   Hypoxic, AMS>ED>BiPAP, workup pending  Interim History / Subjective:  PCCM consulted for ICU evaluation  Objective    Blood pressure (!) 105/48, pulse 65, temperature (!) 95.5 F (35.3 C), temperature source Rectal, resp. rate 11, SpO2 92%.       No intake or output data in the 24 hours ending 01/29/24 1335 There were no vitals filed for this visit.  Examination: General: Chronically-ill cachetic male lying in bed HENT: soft c-collar in place, PERRL 2 mm bilaterally, [place]/AT Lungs: Coarse, stridorous breath sounds, on HFNC 10L Cardiovascular: NSR Abdomen: soft, non distended Extremities: warm, dry, bilateral 1+ pitting pedal edema Neuro: GCS 9 E2V2M5, localizes to noxious stimuli  Resolved Problem List:   Assessment and Plan:   Acute hypoxic hypercarbic respiratory failure COPD; CPAP use at home; recently prescribed oxygen PRN C/f aspiration event Encephalopathy in setting of hypoxia/hypercarbia; s/p Narcan in ED Dysphagia, odynophagia  Protein-calorie malnutrition; hypoalbuminemia Cervical dystonia s/p ACDF (2022) Stage IV tongue base  carcinoma s/p radiation/chemo (2016) prior trach/PEG Chronic cervical pain s/p radiation/chemo/ACDF Elevated BNP in setting of hypoxia -Placed on 15L NRB in ED; weaned to HFNC with SpO2 92% -Wean oxygen to maintain SpO2 >92% -ABG 7.28/64.6/59/30; ABG PRN -Trop 14; BNP 192; likely 2/2 hypoxia -Will place on BiPAP; patient is high risk for aspiration, however after speaking with his  HCPOA, it does not seem intubation is something he would want, given his surgical history and cervical stenosis he will likely be a difficult airway. Will trial BiPAP to see if that improves his mental status. If he improves, will discuss next steps with him and confirm his wishes should he decline again. HCPOA reports patient has mentioned not wanting to be kept alive by artificial means. At this time, in agreement with his HCPOA, will make DNR/DNI, with plans to continue to measures including BiPAP, antibiotics, and fluids. -Seen 11/2020 by Otolaryngology>>epiglottis slightly misaligned; sufficient space for respiration>rec FEES, esophageal dilation; patient declined -CXR stable; minimal bibasilar atelectasis -Empirically started on Ceftriaxone/Azithromax x 5 days for CAP coverage -Scheduled duonebs; ICS -NTS prior to placing BiPAP -Decadron  5mg , c/f stridor and airway edema -NPO; will need official SLP/FEES; likely will need permanent feeding tube placed while inpatient. HCPOA stated he previously had a PEG tube that was inadvertently removed and he has declined replacement. Will need to revisit conversation with improvement in neuro status. In the interim will have Cortrak place DHT. -Restart home medications as indicated; will need enteral route. -Optimize MMPR -CTH to r/o intracranial abnormality -Avoid sedating medications -UA/UDS pending -Monitor electrolytes on labs and replace as indicated  DVT ppx: Hep Sq GI ppx: Protonix  40mg  bid (takes PO 80mg  Protonix  at home)  Labs:  CBC: Recent Labs  Lab 01/29/24 1245 01/29/24 1252 01/29/24 1253 01/29/24 1300  WBC 8.3  --   --   --   NEUTROABS 7.8*  --   --   --   HGB 12.4* 13.6 13.6 11.9*  HCT 38.5* 40.0 40.0 35.0*  MCV 97.5  --   --   --   PLT 169  --   --   --     Basic Metabolic Panel: Recent Labs  Lab 01/29/24 1252 01/29/24 1253 01/29/24 1300  NA 141 140 139  K 4.2 4.1 4.1  CL  --  101  --   GLUCOSE  --  108*  --   BUN   --  29*  --   CREATININE  --  1.20  --    GFR: CrCl cannot be calculated (Unknown ideal weight.). Recent Labs  Lab 01/29/24 1245 01/29/24 1252  WBC 8.3  --   LATICACIDVEN  --  1.0   Liver Function Tests: No results for input(s): AST, ALT, ALKPHOS, BILITOT, PROT, ALBUMIN in the last 168 hours. No results for input(s): LIPASE, AMYLASE in the last 168 hours. No results for input(s): AMMONIA in the last 168 hours.  ABG    Component Value Date/Time   PHART 7.283 (L) 01/29/2024 1300   PCO2ART 64.6 (H) 01/29/2024 1300   PO2ART 59 (L) 01/29/2024 1300   HCO3 30.6 (H) 01/29/2024 1300   TCO2 33 (H) 01/29/2024 1300   O2SAT 86 01/29/2024 1300    Coagulation Profile: Recent Labs  Lab 01/29/24 1245  INR 0.9   Cardiac Enzymes: No results for input(s): CKTOTAL, CKMB, CKMBINDEX, TROPONINI in the last 168 hours.  HbA1C: No results found for: HGBA1C  CBG: No results for input(s): GLUCAP in the last 168 hours.  Review of Systems:   Review of systems completed with pertinent positives/negatives outlined in above HPI. Past Medical History:  He,  has a past medical history of Allergy, Anemia, Anxiety, Arthritis, Cancer (HCC), COPD (chronic obstructive pulmonary disease) (HCC), Dyspnea, Emphysema of lung (HCC), GERD (gastroesophageal reflux disease), Hepatitis C, History of radiation therapy (11/10/14- 12/29/14), Hypertension, Hypothyroidism, PNA (pneumonia), Sleep apnea, Thyroid  disease, and Ulcer.   Surgical History:   Past Surgical History:  Procedure Laterality Date   ANTERIOR CERVICAL DECOMP/DISCECTOMY FUSION N/A 05/27/2020   Procedure: Cervical Three-Four Anterior cervical decompression/discectomy/fusion;  Surgeon: Gillie Duncans, MD;  Location: [facility] OR;  Service: Neurosurgery;  Laterality: N/A;  anterior   BACK SURGERY     COLONOSCOPY WITH PROPOFOL  N/A 01/06/2020   Procedure: COLONOSCOPY WITH PROPOFOL ;  Surgeon: San Sandor GAILS, DO;  Location: [facility]  ENDOSCOPY;  Service: Gastroenterology;  Laterality: N/A;   ELBOW SURGERY Left    HEMOSTASIS CLIP PLACEMENT  01/06/2020   Procedure: HEMOSTASIS CLIP PLACEMENT;  Surgeon: San Sandor GAILS, DO;  Location: [facility] ENDOSCOPY;  Service: Gastroenterology;;   POLYPECTOMY  01/06/2020   Procedure: POLYPECTOMY;  Surgeon: San Sandor GAILS, DO;  Location: [facility] ENDOSCOPY;  Service: Gastroenterology;;   SHOULDER SURGERY     TONSILLECTOMY     tracheotomy     WRIST SURGERY     right     Social History:   reports that he quit smoking about 8 years ago. His smoking use included cigarettes. He started smoking about 52 years ago. He has a 22 pack-year smoking history. He has never used smokeless tobacco. He reports that he does not drink alcohol and does not use drugs.   Family History:  His family history includes Cancer in his mother; Heart disease in his father. There is no history of Colon cancer, Stomach cancer, Pancreatic cancer, Esophageal cancer, or Rectal cancer.   Allergies Allergies  Allergen Reactions   Bee Venom Swelling   Vicodin [Hydrocodone -Acetaminophen ] Nausea And Vomiting     Home Medications  Prior to Admission medications   Medication Sig Start Date End Date Taking? Authorizing Provider  acetaminophen  (TYLENOL ) 500 MG tablet Take 1,000 mg by mouth every 8 (eight) hours as needed for moderate pain.    [provider]  albuterol  (PROVENTIL  HFA;VENTOLIN  HFA) 108 (90 BASE) MCG/ACT inhaler Inhale 2 puffs into the lungs every 6 (six) hours as needed for wheezing or shortness of breath.     [provider]  amLODipine  (NORVASC ) 10 MG tablet Take 10 mg by mouth daily.    [provider]  buPROPion  (WELLBUTRIN  SR) 150 MG 12 hr tablet Take 150 mg by mouth 2 (two) times daily.    [provider]  chlorhexidine  (PERIDEX ) 0.12 % solution Use as directed 15 mLs in the mouth or throat 2 (two) times daily.    [provider]  COVID-19 mRNA bivalent vaccine,  Pfizer, (PFIZER COVID-19 VAC BIVALENT) injection Inject into the muscle. 01/03/21   Luiz Channel, MD  COVID-19 mRNA Vac-TriS, Pfizer, (PFIZER-BIONT COVID-19 VAC-TRIS) SUSP injection Inject into the muscle. 08/04/20   Luiz Channel, MD  COVID-19 mRNA vaccine, Pfizer, (COMIRNATY ) syringe Inject into the muscle. 01/31/23   Luiz Channel, MD  cyclobenzaprine  (FLEXERIL ) 10 MG tablet Take 1 tablet (10 mg total) by mouth 3 (three) times daily as needed for muscle spasms. 05/28/20   Lanis Pupa, MD  gabapentin  (NEURONTIN ) 300 MG capsule Take 300-600 mg by mouth See admin instructions. Take 1 capsule (300 mg) by mouth in the morning, take  1 capsule (300 mg) by mouth at noon & take 3 capsules (900 mg) by mouth at night.    [provider]  hydrOXYzine  (ATARAX /VISTARIL ) 10 MG tablet Take 10 mg by mouth 3 (three) times daily as needed for anxiety.    [provider]  ipratropium (ATROVENT ) 0.02 % nebulizer solution Take 0.5 mg by nebulization 4 (four) times daily as needed for wheezing or shortness of breath.     [provider]  ipratropium (ATROVENT ) 0.06 % nasal spray Place 1 spray into both nostrils 4 (four) times daily.    [provider]  levothyroxine  (SYNTHROID ) 112 MCG tablet Take 112 mcg by mouth daily before breakfast.    [provider]  lisinopril  (PRINIVIL ,ZESTRIL ) 20 MG tablet Take 20 mg by mouth daily.    [provider]  loratadine  (CLARITIN ) 10 MG tablet Take 10 mg by mouth daily.    [provider]  Magnesium  Oxide 420 MG TABS Take 210 mg by mouth daily.    [provider]  meloxicam (MOBIC) 7.5 MG tablet Take 7.5 mg by mouth 2 (two) times daily as needed (back spasms).     [provider]  pantoprazole  (PROTONIX ) 40 MG tablet Take 40 mg by mouth in the morning and at bedtime.     [provider]  sodium chloride  (OCEAN) 0.65 % SOLN nasal spray Place 1 spray into both nostrils in the morning, at  noon, and at bedtime.    [provider]  SODIUM CHLORIDE  PO Take 125 g by mouth daily.    [provider]  sodium fluoride  (FLUORISHIELD) 1.1 % GEL dental gel Place 1 application onto teeth in the morning and at bedtime.    [provider]  tamsulosin  (FLOMAX ) 0.4 MG CAPS capsule Take 0.8 mg by mouth at bedtime.     [provider]    Critical care time: 50 minutes    Hugo Lybrand, DNP, AGACNP-BC [facility]  Please see [email or web address] for pager details.  From 7A-7P if no response, please call [phone]. After hours, please call ELink [phone].

## 2024-01-29 NOTE — ED Provider Notes (Signed)
 [place] EMERGENCY DEPARTMENT AT [facility] Provider Note   CSN: [id number] Arrival date & time: 01/29/24  1225     Patient presents with: No chief complaint on file.   George Nielsen is a 68 y.o. male.   The history is provided by the EMS personnel and medical records. The history is limited by the condition of the patient. No language interpreter was used.  Shortness of Breath Severity:  Unable to specify Onset quality:  Unable to specify Timing:  Unable to specify Progression:  Unable to specify Context: URI (cough)   Relieved by:  Oxygen Worsened by:  Nothing Ineffective treatments:  None tried Associated symptoms: cough   Associated symptoms: no abdominal pain, no chest pain, no headaches, no rash and no vomiting        Prior to Admission medications   Medication Sig Start Date End Date Taking? Authorizing Provider  acetaminophen  (TYLENOL ) 500 MG tablet Take 1,000 mg by mouth every 8 (eight) hours as needed for moderate pain.    [provider]  albuterol  (PROVENTIL  HFA;VENTOLIN  HFA) 108 (90 BASE) MCG/ACT inhaler Inhale 2 puffs into the lungs every 6 (six) hours as needed for wheezing or shortness of breath.     [provider]  amLODipine  (NORVASC ) 10 MG tablet Take 10 mg by mouth daily.    [provider]  buPROPion  (WELLBUTRIN  SR) 150 MG 12 hr tablet Take 150 mg by mouth 2 (two) times daily.    [provider]  chlorhexidine  (PERIDEX ) 0.12 % solution Use as directed 15 mLs in the mouth or throat 2 (two) times daily.    [provider]  COVID-19 mRNA bivalent vaccine, Pfizer, (PFIZER COVID-19 VAC BIVALENT) injection Inject into the muscle. 01/03/21   Luiz Channel, MD  COVID-19 mRNA Vac-TriS, Pfizer, (PFIZER-BIONT COVID-19 VAC-TRIS) SUSP injection Inject into the muscle. 08/04/20   Luiz Channel, MD  COVID-19 mRNA vaccine, Pfizer, (COMIRNATY ) syringe Inject into the muscle. 01/31/23   Luiz Channel, MD   cyclobenzaprine  (FLEXERIL ) 10 MG tablet Take 1 tablet (10 mg total) by mouth 3 (three) times daily as needed for muscle spasms. 05/28/20   Lanis Pupa, MD  gabapentin  (NEURONTIN ) 300 MG capsule Take 300-600 mg by mouth See admin instructions. Take 1 capsule (300 mg) by mouth in the morning, take 1 capsule (300 mg) by mouth at noon & take 3 capsules (900 mg) by mouth at night.    [provider]  hydrOXYzine  (ATARAX /VISTARIL ) 10 MG tablet Take 10 mg by mouth 3 (three) times daily as needed for anxiety.    [provider]  ipratropium (ATROVENT ) 0.02 % nebulizer solution Take 0.5 mg by nebulization 4 (four) times daily as needed for wheezing or shortness of breath.     [provider]  ipratropium (ATROVENT ) 0.06 % nasal spray Place 1 spray into both nostrils 4 (four) times daily.    [provider]  levothyroxine  (SYNTHROID ) 112 MCG tablet Take 112 mcg by mouth daily before breakfast.    [provider]  lisinopril  (PRINIVIL ,ZESTRIL ) 20 MG tablet Take 20 mg by mouth daily.    [provider]  loratadine  (CLARITIN ) 10 MG tablet Take 10 mg by mouth daily.    [provider]  Magnesium  Oxide 420 MG TABS Take 210 mg by mouth daily.    [provider]  meloxicam (MOBIC) 7.5 MG tablet Take 7.5 mg by mouth 2 (two) times daily as needed (back spasms).     [provider]  pantoprazole  (PROTONIX ) 40 MG tablet Take 40 mg by mouth in the morning and at bedtime.     [provider]  sodium chloride  (OCEAN) 0.65 % SOLN nasal spray Place 1 spray into both nostrils in the morning, at noon, and at bedtime.    [provider]  SODIUM CHLORIDE  PO Take 125 g by mouth daily.    [provider]  sodium fluoride  (FLUORISHIELD) 1.1 % GEL dental gel Place 1 application onto teeth in the morning and at bedtime.    [provider]  tamsulosin  (FLOMAX ) 0.4 MG CAPS capsule Take 0.8 mg by mouth at bedtime.      [provider]    Allergies: Bee venom and Vicodin [hydrocodone -acetaminophen ]    Review of Systems  Unable to perform ROS: Mental status change  Constitutional:  Positive for fatigue.  Respiratory:  Positive for cough and shortness of breath.   Cardiovascular:  Negative for chest pain.  Gastrointestinal:  Negative for abdominal pain, constipation, diarrhea, nausea and vomiting.  Skin:  Negative for rash.  Neurological:  Negative for headaches.  Psychiatric/Behavioral:  Negative for agitation.   All other systems reviewed and are negative.   Updated Vital Signs BP (!) 149/85   Pulse 82   Temp (!) 97.4 F (36.3 C) (Axillary)   Resp (!) 21   SpO2 97%   Physical Exam Vitals and nursing note reviewed.  Constitutional:      General: He is in acute distress.     Comments: Somnolent  HENT:     Head: Normocephalic and atraumatic.     Comments: Tilting his head to the Left in a soft collar    Mouth/Throat:     Mouth: Mucous membranes are dry.     Pharynx: No oropharyngeal exudate or posterior oropharyngeal erythema.  Eyes:     Extraocular Movements: Extraocular movements intact.     Right eye: Normal extraocular motion and no nystagmus.     Left eye: Normal extraocular motion and no nystagmus.     Conjunctiva/sclera: Conjunctivae normal.     Pupils: Pupils are equal, round, and reactive to light.     Comments: Pupils are 3 mm and sluggish bilaterally  Cardiovascular:     Rate and Rhythm: Normal rate and regular rhythm.     Heart sounds: No murmur heard. Pulmonary:     Effort: Pulmonary effort is normal. No respiratory distress.     Breath sounds: Rhonchi present. No wheezing or rales.  Chest:     Chest wall: No tenderness.  Abdominal:     General: Abdomen is flat.     Palpations: Abdomen is soft.     Tenderness: There is no abdominal tenderness. There is no guarding or rebound.  Musculoskeletal:        General: No swelling or tenderness.     Cervical back:  Neck supple.  Skin:    General: Skin is warm and dry.     Capillary Refill: Capillary refill takes less than 2 seconds.     Findings: No erythema or rash.  Neurological:     GCS: GCS eye subscore is 2. GCS verbal subscore is 2. GCS motor subscore is 6.     Cranial Nerves: No facial asymmetry.     Comments: Somnolent  Psychiatric:        Mood and Affect: Mood normal.     (all labs ordered are listed, but only abnormal results are displayed) Labs Reviewed  COMPREHENSIVE METABOLIC PANEL WITH  GFR - Abnormal; Notable for the following components:      Result Value   Glucose, Bld 112 (*)    BUN 27 (*)    Calcium 8.1 (*)    Total Protein 6.2 (*)    Albumin 3.1 (*)    All other components within normal limits  CBC WITH DIFFERENTIAL/PLATELET - Abnormal; Notable for the following components:   RBC 3.95 (*)    Hemoglobin 12.4 (*)    HCT 38.5 (*)    Neutro Abs 7.8 (*)    Lymphs Abs 0.2 (*)    Abs Immature Granulocytes 0.08 (*)    All other components within normal limits  TSH - Abnormal; Notable for the following components:   TSH 9.150 (*)    All other components within normal limits  BRAIN NATRIURETIC PEPTIDE - Abnormal; Notable for the following components:   B Natriuretic Peptide 192.1 (*)    All other components within normal limits  CBC - Abnormal; Notable for the following components:   RBC 4.08 (*)    Hemoglobin 12.7 (*)    All other components within normal limits  I-STAT VENOUS BLOOD GAS, ED - Abnormal; Notable for the following components:   pH, Ven 7.243 (*)    pCO2, Ven 66.9 (*)    Bicarbonate 28.9 (*)    Calcium, Ion 1.14 (*)    All other components within normal limits  I-STAT CHEM 8, ED - Abnormal; Notable for the following components:   BUN 29 (*)    Glucose, Bld 108 (*)    Calcium, Ion 1.13 (*)    All other components within normal limits  I-STAT ARTERIAL BLOOD GAS, ED - Abnormal; Notable for the following components:   pH, Arterial 7.283 (*)    pCO2  arterial 64.6 (*)    pO2, Arterial 59 (*)    Bicarbonate 30.6 (*)    TCO2 33 (*)    HCT 35.0 (*)    Hemoglobin 11.9 (*)    All other components within normal limits  CULTURE, BLOOD (ROUTINE X 2)  CULTURE, BLOOD (ROUTINE X 2)  RESP PANEL BY RT-PCR (RSV, FLU A&B, COVID)  RVPGX2  MRSA NEXT GEN BY PCR, NASAL  EXPECTORATED SPUTUM ASSESSMENT W GRAM STAIN, RFLX TO RESP C  PROTIME-INR  LIPASE, BLOOD  GLUCOSE, CAPILLARY  URINALYSIS, W/ REFLEX TO CULTURE (INFECTION SUSPECTED)  RAPID URINE DRUG SCREEN, HOSP PERFORMED  HIV ANTIBODY (ROUTINE TESTING W REFLEX)  CREATININE, SERUM  BLOOD GAS, VENOUS  I-STAT CG4 LACTIC ACID, ED  I-STAT CG4 LACTIC ACID, ED  I-STAT CG4 LACTIC ACID, ED  I-STAT CG4 LACTIC ACID, ED  TROPONIN I (HIGH SENSITIVITY)  TROPONIN I (HIGH SENSITIVITY)    EKG: None  Radiology: CT Head Wo Contrast Result Date: 01/29/2024 CLINICAL DATA:  Altered level of consciousness EXAM: CT HEAD WITHOUT CONTRAST TECHNIQUE: Contiguous axial images were obtained from the base of the skull through the vertex without intravenous contrast. RADIATION DOSE REDUCTION: This exam was performed according to the departmental dose-optimization program which includes automated exposure control, adjustment of the mA and/or kV according to patient size and/or use of iterative reconstruction technique. COMPARISON:  11/09/2023 FINDINGS: Brain: Since the previous exam, hypodensities have developed within the right frontal parietal periventricular and subcortical white matter, consistent with prior infarct. No evidence of acute infarct or hemorrhage. The lateral ventricles and midline structures are unremarkable. No acute extra-axial fluid collections. No mass effect. Vascular: No hyperdense vessel or unexpected calcification. Skull: Normal. Negative for fracture or  focal lesion. Sinuses/Orbits: No acute finding. Other: None. IMPRESSION: 1. Subacute to chronic appearing small vessel ischemic changes within the right  frontal parietal periventricular and subcortical white matter, new since prior exam. 2. No other signs of acute infarct or hemorrhage. Electronically Signed   By: Ozell Daring M.D.   On: 01/29/2024 14:59   DG Chest Portable 1 View Result Date: 01/29/2024 EXAM: 1 VIEW XRAY OF THE CHEST 01/29/2024 01:48:00 PM COMPARISON: Previous exams are available. CLINICAL HISTORY: ams, hypotension, hypoxia, cough FINDINGS: LUNGS AND PLEURA: Minimal bibasilar atelectasis is noted. No focal pulmonary opacity. No pulmonary edema. No pleural effusion. No pneumothorax. HEART AND MEDIASTINUM: No acute abnormality of the cardiac and mediastinal silhouettes. BONES AND SOFT TISSUES: No acute osseous abnormality. IMPRESSION: 1. Minimal bibasilar atelectasis. Electronically signed by: Lynwood Seip MD 01/29/2024 02:09 PM EST RP Workstation: [id number]     Procedures   CRITICAL CARE Performed by: Lonni PARAS Draiden Mirsky Total critical care time: 30 minutes Critical care time was exclusive of separately billable procedures and treating other patients. Critical care was necessary to treat or prevent imminent or life-threatening deterioration. Critical care was time spent personally by me on the following activities: development of treatment plan with patient and/or surrogate as well as nursing, discussions with consultants, evaluation of patient's response to treatment, examination of patient, obtaining history from patient or surrogate, ordering and performing treatments and interventions, ordering and review of laboratory studies, ordering and review of radiographic studies, pulse oximetry and re-evaluation of patient's condition.   Medications Ordered in the ED  Chlorhexidine  Gluconate Cloth 2 % PADS 6 each (has no administration in time range)  polyethylene glycol (MIRALAX / GLYCOLAX) packet 17 g (has no administration in time range)  heparin  injection 5,000 Units (has no administration in time range)  lactated ringers   infusion (has no administration in time range)  ondansetron  (ZOFRAN ) injection 4 mg (has no administration in time range)  pantoprazole  (PROTONIX ) injection 40 mg (has no administration in time range)  dexamethasone  (DECADRON ) injection 5 mg (has no administration in time range)  azithromycin  (ZITHROMAX ) 500 mg in sodium chloride  0.9 % 250 mL IVPB (has no administration in time range)  albuterol  (PROVENTIL ) (2.5 MG/3ML) 0.083% nebulizer solution 2.5 mg (has no administration in time range)  piperacillin-tazobactam (ZOSYN) IVPB 3.375 g (has no administration in time range)  naloxone (NARCAN) injection 0.4 mg (0.4 mg Intravenous Given 01/29/24 1329)                                    Medical Decision Making Amount and/or Complexity of Data Reviewed Labs: ordered. Radiology: ordered.  Risk Prescription drug management. Decision regarding hospitalization.    George Nielsen is a 68 y.o. male with a past medical history significant for hypertension, COPD, CKD, previous cancer in the base of the tongue, previous neck surgery, anxiety, and GERD who presents with hypotension, hypoxia, and abnormal breathing.  According to EMS and caregiver, patient today is having abnormal breathing and is more somnolent and altered than baseline.  EMS and fire were concerned due to pinpoint pupils and gave Narcan and patient started to breathe better.  Then was apneic again with EMS and was given more Narcan and started to respond.  There was no known ingestion of narcotics but he does have a prescription for pain medicine a caregiver reported.  Patient has been having some coughing as well and there is a phone  note the patient was having choking the other day.  On arrival, patient is afebrile and has a normal heart rate and slow respiratory rate.  For me initially oxygen saturations are in the 90s on nasal cannula.  Patient does have his neck bent to the left and has a soft collar in place.  There was no  wheezing for me but initially there was rhonchi.  He did not have rales but he has edema in his legs.  EMS also gave fluids as his blood pressure was in the 70s but was improving.  Patient moved extremities after stimulation and he woke up somewhat.  Pupils were 3 mm and reactive initially.  Still not answering questions.  Clinically I am concerned given the patient's breathing.  A variety of labs were ordered to assess his altered mental status and a CT head was ordered.  He started having worsened breathing and again had to be placed on 15 L nonrebreather for oxygen saturations in the 70s so he was given more Narcan and started to wake up again.  Unclear if this etiology but I do not feel is protecting his airway enough for BiPAP initially.  Patient's pCO2 was elevated in the 60s.  Patient was suctioned by respiratory and there was some food particles that were suctioned out.  I am somewhat concerned about maybe he was choking or had some aspiration.  Patient was afebrile.  Due to the patient's breathing difficulties and blood pressure lability, critical care was called who will see.  Patient was able to wake up somewhat after the Narcan and will hold on intubation or BiPAP.  Will defer to them about antibiotics or not if they are concerned about aspiration but otherwise his labs were not showing critical abnormalities initially.  Critical care will admit for further management of respiratory difficulty altered mental status and episodes of apnea.          Final diagnoses:  Respiratory distress  Acute respiratory failure with hypoxia (HCC)    Clinical Impression: 1. Respiratory distress   2. Acute respiratory failure with hypoxia (HCC)     Disposition: Admit  This note was prepared with assistance of Dragon voice recognition software. Occasional wrong-word or sound-a-like substitutions may have occurred due to the inherent limitations of voice recognition software.          Shifa Brisbon, Lonni PARAS, MD 01/29/24 [phone]

## 2024-01-30 ENCOUNTER — Inpatient Hospital Stay (HOSPITAL_COMMUNITY)

## 2024-01-30 DIAGNOSIS — R1310 Dysphagia, unspecified: Secondary | ICD-10-CM | POA: Diagnosis not present

## 2024-01-30 DIAGNOSIS — G248 Other dystonia: Secondary | ICD-10-CM

## 2024-01-30 DIAGNOSIS — J9621 Acute and chronic respiratory failure with hypoxia: Secondary | ICD-10-CM

## 2024-01-30 DIAGNOSIS — E46 Unspecified protein-calorie malnutrition: Secondary | ICD-10-CM | POA: Diagnosis not present

## 2024-01-30 LAB — GLUCOSE, CAPILLARY
Glucose-Capillary: 100 mg/dL — ABNORMAL HIGH (ref 70–99)
Glucose-Capillary: 107 mg/dL — ABNORMAL HIGH (ref 70–99)
Glucose-Capillary: 125 mg/dL — ABNORMAL HIGH (ref 70–99)
Glucose-Capillary: 138 mg/dL — ABNORMAL HIGH (ref 70–99)
Glucose-Capillary: 206 mg/dL — ABNORMAL HIGH (ref 70–99)
Glucose-Capillary: 95 mg/dL (ref 70–99)

## 2024-01-30 LAB — CBC
HCT: 35.2 % — ABNORMAL LOW (ref 39.0–52.0)
Hemoglobin: 11.4 g/dL — ABNORMAL LOW (ref 13.0–17.0)
MCH: 31 pg (ref 26.0–34.0)
MCHC: 32.4 g/dL (ref 30.0–36.0)
MCV: 95.7 fL (ref 80.0–100.0)
Platelets: 175 K/uL (ref 150–400)
RBC: 3.68 MIL/uL — ABNORMAL LOW (ref 4.22–5.81)
RDW: 15.1 % (ref 11.5–15.5)
WBC: 6.6 K/uL (ref 4.0–10.5)
nRBC: 0 % (ref 0.0–0.2)

## 2024-01-30 LAB — BASIC METABOLIC PANEL WITH GFR
Anion gap: 7 (ref 5–15)
BUN: 21 mg/dL (ref 8–23)
CO2: 30 mmol/L (ref 22–32)
Calcium: 8 mg/dL — ABNORMAL LOW (ref 8.9–10.3)
Chloride: 104 mmol/L (ref 98–111)
Creatinine, Ser: 0.98 mg/dL (ref 0.61–1.24)
GFR, Estimated: >60 mL/min (ref >60)
Glucose, Bld: 97 mg/dL (ref 70–99)
Potassium: 3.6 mmol/L (ref 3.5–5.1)
Sodium: 141 mmol/L (ref 135–145)

## 2024-01-30 LAB — MAGNESIUM
Magnesium: 1.1 mg/dL — ABNORMAL LOW (ref 1.7–2.4)
Magnesium: 2.4 mg/dL (ref 1.7–2.4)

## 2024-01-30 LAB — PHOSPHORUS: Phosphorus: 3.7 mg/dL (ref 2.5–4.6)

## 2024-01-30 MED ORDER — ENOXAPARIN SODIUM 40 MG/0.4ML IJ SOSY
40.0000 mg | PREFILLED_SYRINGE | INTRAMUSCULAR | Status: DC
  Administered 2024-01-30 – 2024-02-02 (×4): 40 mg via SUBCUTANEOUS
  Filled 2024-01-30 (×4): qty 0.4

## 2024-01-30 MED ORDER — PREDNISONE 20 MG PO TABS
40.0000 mg | ORAL_TABLET | Freq: Every day | ORAL | Status: DC
  Filled 2024-01-30: qty 2

## 2024-01-30 MED ORDER — ALBUTEROL SULFATE (2.5 MG/3ML) 0.083% IN NEBU
2.5000 mg | INHALATION_SOLUTION | RESPIRATORY_TRACT | Status: DC | PRN
  Administered 2024-01-30: 2.5 mg via RESPIRATORY_TRACT
  Filled 2024-01-30: qty 3

## 2024-01-30 MED ORDER — MAGNESIUM SULFATE 2 GM/50ML IV SOLN
2.0000 g | Freq: Once | INTRAVENOUS | Status: AC
  Administered 2024-01-30: 2 g via INTRAVENOUS
  Filled 2024-01-30: qty 50

## 2024-01-30 MED ORDER — MAGNESIUM SULFATE 4 GM/100ML IV SOLN
4.0000 g | Freq: Once | INTRAVENOUS | Status: AC
  Administered 2024-01-30: 4 g via INTRAVENOUS
  Filled 2024-01-30: qty 100

## 2024-01-30 MED ORDER — METHYLPREDNISOLONE SODIUM SUCC 40 MG IJ SOLR
40.0000 mg | Freq: Every day | INTRAMUSCULAR | Status: AC
  Administered 2024-01-30 – 2024-02-02 (×4): 40 mg via INTRAVENOUS
  Filled 2024-01-30 (×4): qty 1

## 2024-01-30 MED ORDER — POTASSIUM CHLORIDE 10 MEQ/100ML IV SOLN
10.0000 meq | INTRAVENOUS | Status: AC
  Administered 2024-01-30 (×4): 10 meq via INTRAVENOUS
  Filled 2024-01-30 (×4): qty 100

## 2024-01-30 MED ORDER — CEFTRIAXONE SODIUM 2 G IJ SOLR
2.0000 g | INTRAMUSCULAR | Status: AC
  Administered 2024-01-30 – 2024-02-02 (×4): 2 g via INTRAVENOUS
  Filled 2024-01-30 (×4): qty 20

## 2024-01-30 NOTE — Progress Notes (Signed)
 eLink Physician-Brief Progress Note Patient Name: George Nielsen DOB: 04/20/1955 MRN: [id number]   Date of Service  01/30/2024  HPI/Events of Note  moist cough, desat episodes, rhonchi per bedside RN and patient asking for another Neb  eICU Interventions  Add PRN albuterol      Intervention Category Intermediate Interventions: Respiratory distress - evaluation and management  Shanvi Moyd 01/30/2024, 6:17 AM

## 2024-01-30 NOTE — Procedures (Addendum)
 Modified Barium Swallow Study  Patient Details  Name: George Nielsen MRN: [id number] Date of Birth: May 15, 1955  Today's Date: 01/30/2024  Modified Barium Swallow completed.  Full report located under Chart Review in the Imaging Section.  History of Present Illness Patient is a 68 y.o. 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 On exam, patient on HFNC 10L with SpO2 >94%, did not follow commands. Food particles noted on oral inspection. He was admitted to ICU and placed on BiPAP. CTH showed Subacute to chronic appearing small vessel ischemic changes within the right frontal parietal periventricular and subcortical white matter but no other signs of acute infarct or hemorrhage. CXR showed minimal bibasilar atelectasis. Patient was repeatedly taking off BiPAP resulting in oxygen saturation dropping to 80's%. RT placed patient on 8L [place]. Patient has been NPO and SLP swallow evaluation ordered.   Clinical Impression Patient presents with a severe-profound oropharyngeal dysphagia as per this MBS. Because of his cervical anatomy, his head was in a chin tuck and head turned to left position. Visualization of pharyngeal structures was challenging and clear visualization of epiglottis was not achieved. Patient exhibited very minimal anterior movement of hyoid, incomplete laryngeal vestibule closure, no epiglottic inversion and minimal laryngeal elevation. Penetration to the vocal cords that did not clear (PAS 5) as well as silent aspiration (PAS 8) occured with all tested barium consistencies (thin, nectar thick, honey thick, puree). Wet sounding voice and instances of patient's throat clearing did correlate with  barium residuals at approximate level of the vocal cords. Patient unable to effectively clear residuals in laryngeal vestibule or in pharynx. Retrograde movement of barium observed coming up through the PES and at least one time resulting in penetration and then aspiration. Unfortunately, SLP cannot recommend any safe PO consistency and there are no strategies that patient would be able to perform to reduce aspiration risk. SLP recommends strict NPO status.   DIGEST Swallow Severity Rating*  Safety: 4  Efficiency: 3  Overall Pharyngeal Swallow Severity: 4 1: mild; 2: moderate; 3: severe; 4: profound  *The Dynamic Imaging Grade of Swallowing Toxicity is standardized for the head and neck cancer population, however, demonstrates promising clinical applications across populations to standardize the clinical rating of pharyngeal swallow safety and severity.  Factors that may increase risk of adverse event in presence of aspiration Noe & Lianne 2021): Poor general health and/or compromised immunity;Limited mobility;Frail or deconditioned;Weak cough;Frequent aspiration of large volumes  Swallow Evaluation Recommendations Recommendations: NPO Medication Administration: Via alternative means Oral care recommendations: Oral care QID (4x/day) Recommended consults: Consider Palliative care     Norleen IVAR Blase, MA, CCC-SLP Speech Therapy

## 2024-01-30 NOTE — Progress Notes (Signed)
 RT called by RN patient kept ripping off BiPAP mask.  Oxygen saturation dropped to low 80's.  Patient placed on 8L [place].  VSS.  Will titrate as tolerated. RT will continue to monitor.

## 2024-01-30 NOTE — Progress Notes (Signed)
 Placed patient back on bipap due to decrease in Sp02 and periods of sleep apnea.

## 2024-01-30 NOTE — Progress Notes (Signed)
 Cortrak Tube Team Note:  Consult received to place a Cortrak feeding tube. Pt just arrived to unit from ED, currently on BiPap.   Unable to place Cortrak tube today. Discussed with PCCM. Plan to keep patient on Cortrak list with tentative plan for Cortrak on Friday (11/8). PCCM request post-pyloric placement; if this is needed prior to Friday, recommend consulting Radiology for post pyloric placement. If gastric placement acceptable, RN can place NG tube at bedside.   If the tube becomes dislodged please keep the tube and contact the Cortrak team at [email or web address] for replacement.  If after hours and replacement cannot be delayed, place a NG tube and confirm placement with an abdominal x-ray.    Betsey Finger MS, RDN, LDN, CNSC Registered Dietitian 3 Clinical Nutrition RD Inpatient Contact Info in Amion

## 2024-01-30 NOTE — TOC Initial Note (Addendum)
 Transition of Care [facility]) - Initial/Assessment Note    Patient Details  Name: George Nielsen MRN: [id number] Date of Birth: 09-Dec-1955  [facility]) CM/SW Contact:    Lauraine FORBES Saa, LCSWA Phone Number: 01/30/2024, 2:47 PM  Clinical Narrative:                  2:47 PM CSW introduced self and role to patient's significant other, George (per chart review, patient is not fully oriented). George confirmed she resides at home with patient but works during the day. George informed CSW that patient drove self to appointments prior to current admission but that she could also provide transportation. George confirmed patient does not have SNF history. George informed CSW that patient has HH and DME (oxygen, nebulizer, CPAP) history with the [facility]. Per chart review, patient has a PCP and insurance. Patient's preferred pharmacy's are [facility] and [facility] [address]. CSW notified [facility] of patient's current admission ([id number]). No other TOC needs identified at this time. TOC will continue to follow.  Expected Discharge Plan: Home/Self Care Barriers to Discharge: Continued Medical Work up   Patient Goals and CMS Choice            Expected Discharge Plan and Services       Living arrangements for the past 2 months: Single Family Home                                      Prior Living Arrangements/Services Living arrangements for the past 2 months: Single Family Home Lives with:: Significant Other Patient language and need for interpreter reviewed:: Yes        Need for Family Participation in Patient Care: Yes (Comment) Care giver support system in place?: No (comment) Current home services: DME Criminal Activity/Legal Involvement Pertinent to Current Situation/Hospitalization: No - Comment as needed  Activities of Daily Living      Permission Sought/Granted Permission sought to share information with : Family  Supports Permission granted to share information with : No (Contact information on chart)  Share Information with NAME: George Nielsen     Permission granted to share info w Relationship: Significant Other  Permission granted to share info w Contact Information: [phone]  Emotional Assessment   Attitude/Demeanor/Rapport: Unable to Assess Affect (typically observed): Unable to Assess Orientation: : Oriented to Self, Oriented to Place Alcohol / Substance Use: Not Applicable Psych Involvement: No (comment)  Admission diagnosis:  Hypoxia [R09.02] Patient Active Problem List   Diagnosis Date Noted   Hypoxia 01/29/2024   HNP (herniated nucleus pulposus) with myelopathy, cervical 05/27/2020   Positive FIT (fecal immunochemical test)    Polyp of ascending colon    Adenomatous polyp of sigmoid colon    Diverticulosis of colon without hemorrhage    Grade II internal hemorrhoids    Malignant neoplasm of base of tongue (HCC) 10/27/2014   Chronic kidney disease    COPD (chronic obstructive pulmonary disease) (HCC) 07/10/2012   Chest pain 07/10/2012   HTN (hypertension) 07/10/2012   Hyponatremia 07/10/2012   TOBACCO USE 02/07/2010   PCP:  [facility]   [id number] [place], [place] - [address] [place] [address] Phone: [phone] Fax: [phone]  [facility] - [place], [place] - [address] [place] [address] Phone: [phone] Fax: [phone]  Social Drivers of Health (SDOH) Social History: SDOH Screenings   Food Insecurity: No Food Insecurity (06/14/2020)   Received from [facility]  Social Connections: Unknown (08/07/2021)   Received from [facility]  Tobacco Use: Medium Risk (12/02/2023)   Received from [facility]   SDOH Interventions:     Readmission Risk Interventions     No data to display

## 2024-01-30 NOTE — Progress Notes (Signed)
 Initial Nutrition Assessment  DOCUMENTATION CODES:  Severe malnutrition in context of chronic illness  INTERVENTION:  Pt would benefit from cortrak placement and initiation of enteral feeds, at this time pt declines. Monitor for PEG placement. When in place recommend the following: Osmolite 1.5 (or equivalent) at 55 ml/h (1320 ml per day) Start at 25 and advance by 10mL every 12 hours to reach goal Prosource TF20 60 ml 1x/d Free water: 125mL every 4 hours Provides 2060 kcal, 103 gm protein, 1006 ml free water daily ( = TF+flush) If desired, pt could be initiated on bolus feeds to facilitate a quicker discharge. If this is desired, would recommend the following: Osmolite 1.5 (or equivalent), 1 carton 6x/d Administer 1/2 carton of formula on day 1 and increase to 1 full carton on day 2 to monitor for signs of refeeding and also allow time for tolerance Flush with 60mL of free water before and after each bolus ( , 6x/d) This provides 2130kcal, 89g protein, and ( = TF+flush) Pt is at risk for refeeding syndrome given severe malnutrition and poor PO intake. Monitor magnesium  and phosphorus daily x 3 days, MD to replete as needed. 100mg  thiamine x 5 days  NUTRITION DIAGNOSIS:  Severe Malnutrition related to chronic illness (hx cancer) as evidenced by severe muscle depletion, severe fat depletion.  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 Discussed SLP recommendations and MBS yesterday. Reiterated to patient that at this time he is not able to have any medications or nutrition by mouth. Topic of PEG has been approached with patient outpatient and this admission. This AM, pt states that he would like to proceed with having PEG placed.   Also discussed current cortrak order and that it temporary enteral access would be ideal to act as a bridge as it is unlikely he will be able to have tube placed in the next 24-48 hours. Pt adamantly refuses this. Explained the procedure and negatives of not having enteral access if he refused (no enteral medications, no nutrition, risk of further deconditioning) and pt continues to refuse. Passed along conversation to MD and RN. Alerted TOC that pt will likely need to be set up with supplies and feeds.   TF recommendations once enteral access achieved are above.  Pt does report stable weight ~126 lb. Endorses a decline in appetite and ability to perform ADLs.  Admit weight: 60.2 kg   Current weight: 63.3 kg    Intake/Output Summary (Last 24 hours) at 01/31/2024 1219 Last data filed at 01/31/2024 1100 Gross per 24 hour  Intake 723.66 ml  Output 2950 ml  Net -2226.34 ml  Net IO Since Admission: -1,101.95 mL [01/31/24 1219]  Drains/Lines: UOP x 24 hours  Nutritionally Relevant Medications: Scheduled Meds:  methylPREDNISolone (SOLU-MEDROL) injection  40 mg Intravenous Daily   pantoprazole  IV  40 mg Intravenous Q12H   Continuous Infusions:  azithromycin  Stopped (  01/30/24 1800)   cefTRIAXone (ROCEPHIN)  IV Stopped (01/30/24 1728)   PRN Meds: ondansetron , polyethylene glycol  Labs Reviewed: CBG ranges from 90-206 mg/dL over the last 24 hours  NUTRITION - FOCUSED PHYSICAL EXAM: Flowsheet Row Most Recent Value  Orbital Region Severe depletion  Upper Arm Region Severe depletion  Thoracic and Lumbar Region Severe depletion   Buccal Region Severe depletion  Temple Region Severe depletion  Clavicle Bone Region Severe depletion  Clavicle and Acromion Bone Region Severe depletion  Scapular Bone Region Severe depletion  Dorsal Hand Severe depletion  Patellar Region Severe depletion  Anterior Thigh Region Severe depletion  Posterior Calf Region Severe depletion  Edema (RD Assessment) None  Hair Reviewed  Eyes Reviewed  Mouth Reviewed  Skin Reviewed  Nails Reviewed    Diet Order:   Diet Order             Diet NPO time specified  Diet effective now                   EDUCATION NEEDS:   Education needs have been addressed  Skin:  Skin Assessment: Reviewed RN Assessment  Last BM:  11/6  Height:  Ht Readings from Last 1 Encounters:  01/30/24 5' 9 (1.753 m)    Weight:  Wt Readings from Last 1 Encounters:  01/31/24 65.5 kg    Ideal Body Weight:  72.7 kg  BMI:  Body mass index is 21.32 kg/m.  Estimated Nutritional Needs:  Kcal:  1900-2100 kcal/d Protein:  95-110g/d Fluid:  >/=2L/d    Vernell Lukes, RD, LDN, CNSC Registered Dietitian II Please reach out via secure chat

## 2024-01-30 NOTE — Plan of Care (Signed)
  Problem: Education: Goal: Knowledge of General Education information will improve Description: Including pain rating scale, medication(s)/side effects and non-pharmacologic comfort measures Outcome: Progressing   Problem: Clinical Measurements: Goal: Ability to maintain clinical measurements within normal limits will improve Outcome: Progressing   Problem: Clinical Measurements: Goal: Will remain free from infection Outcome: Progressing   Problem: Clinical Measurements: Goal: Diagnostic test results will improve Outcome: Progressing   Problem: Clinical Measurements: Goal: Respiratory complications will improve Outcome: Progressing   Problem: Clinical Measurements: Goal: Cardiovascular complication will be avoided Outcome: Progressing   Problem: Safety: Goal: Ability to remain free from injury will improve Outcome: Progressing   Problem: Skin Integrity: Goal: Risk for impaired skin integrity will decrease Outcome: Progressing  Plan of care, monitoring, assessment, intervention and treatment ongoing, see MAR see flowsheet

## 2024-01-30 NOTE — Evaluation (Signed)
 Clinical/Bedside Swallow Evaluation Patient Details  Name: MARKELLE NAJARIAN MRN: [id number] Date of Birth: [birth year]  Today's Date: 01/30/2024 Time: SLP Start Time (ACUTE ONLY): 1040 SLP Stop Time (ACUTE ONLY): 1057 SLP Time Calculation (min) (ACUTE ONLY): 17 min  Past Medical History:  Past Medical History:  Diagnosis Date   Allergy    Anemia    Anxiety    Arthritis    Cancer (HCC)    tongue and neck lymph nodes   COPD (chronic obstructive pulmonary disease) (HCC)    Dyspnea    climbing stairs, walking distance   Emphysema of lung (HCC)    GERD (gastroesophageal reflux disease)    Hepatitis C    treated   History of radiation therapy 11/10/14- 12/29/14   BOT and bilateral neck/ 70 Gy in 35 fractions to gross disease, 63 Gy in 35 fractions to high risk nodal echelons, and 56 Gy in 35 fraction to intermediate risk nodal echelons.    Hypertension    Hypothyroidism    PNA (pneumonia)    4 times   Sleep apnea    wears CPAP sometimes per pt.   05/25/19 - not able to use at this time due to neck pain.   Thyroid  disease    take Synthroid  d/t scar tissue from radiation around thyroid    Ulcer    Past Surgical History:  Past Surgical History:  Procedure Laterality Date   ANTERIOR CERVICAL DECOMP/DISCECTOMY FUSION N/A 05/27/2020   Procedure: Cervical Three-Four Anterior cervical decompression/discectomy/fusion;  Surgeon: Gillie Duncans, MD;  Location: [facility] OR;  Service: Neurosurgery;  Laterality: N/A;  anterior   BACK SURGERY     COLONOSCOPY WITH PROPOFOL  N/A 01/06/2020   Procedure: COLONOSCOPY WITH PROPOFOL ;  Surgeon: San Sandor GAILS, DO;  Location: [facility];  Service: Gastroenterology;  Laterality: N/A;   ELBOW SURGERY Left    HEMOSTASIS CLIP PLACEMENT  01/06/2020   Procedure: HEMOSTASIS CLIP PLACEMENT;  Surgeon: San Sandor GAILS, DO;  Location: [facility];  Service: Gastroenterology;;   POLYPECTOMY  01/06/2020   Procedure: POLYPECTOMY;  Surgeon: San Sandor GAILS, DO;   Location: [facility];  Service: Gastroenterology;;   SHOULDER SURGERY     TONSILLECTOMY     tracheotomy     WRIST SURGERY     right   HPI:  Patient is a 68 y.o. 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 On exam, patient on HFNC 10L with SpO2 >94%, did not follow commands. Food particles noted on oral inspection. He was admitted to ICU and placed on BiPAP. CTH showed Subacute to chronic appearing small vessel ischemic changes within the right frontal parietal periventricular and subcortical white matter but no other signs of acute infarct or hemorrhage. CXR showed minimal bibasilar atelectasis. Patient was repeatedly taking off BiPAP resulting in oxygen saturation dropping to 80's%. RT placed patient on 8L [place]. Patient has been NPO and SLP swallow evaluation ordered.    Assessment / Plan / Recommendation  Clinical Impression  Patient presents with clinical s/s of dysphagia as per this bedside swallow evaluation. Based on current presentation and patient's h/o oropharyngeal as well as esophageal dysphagia, he appears to be at a high risk for aspiration. 2021 barium esophagram reported: Tortuosity of the proximal esophagus related to a posterior  cricopharyngeal bar and a anterior shallow esophageal web.   Swallow initiation appeared delayed and hyolaryngeal movement decreased. Patient's voice sounded wet and gurgled but his cough was weak and ineffective to clear secretions. He exhibited immediate and delayed throat clearing and delayed, weak sounding cough while drinking thin liquids (water). SLP discussed with patient recommendation to remain NPO until an objective swallow study (MBS) could be completed.  Patient was in agreement with participating in MBS but wanted to wait until his throat was healed more from being sore from coughing. He expressed that he did not want to be NPO and that he needed to be able to at least drink water due to xerostomia. His spouse in the room was in agreement with SLP's recommendations and she indicated that his swallow has declined since 2022. SLP spoke with MD who plans to speak with patient and spouse regarding POC. SLP will follow. SLP Visit Diagnosis: Dysphagia, unspecified (R13.10)    Aspiration Risk  Severe aspiration risk;Risk for inadequate nutrition/hydration    Diet Recommendation NPO    Medication Administration: Via alternative means    Other  Recommendations Oral Care Recommendations: Oral care QID     Assistance Recommended at Discharge    Functional Status Assessment Patient has had a recent decline in their functional status and demonstrates the ability to make significant improvements in function in a reasonable and predictable amount of time.  Frequency and Duration min 2x/week  2 weeks       Prognosis Prognosis for improved oropharyngeal function: Guarded Barriers to Reach Goals: Severity of deficits;Time post onset      Swallow Study   General Date of Onset: 01/30/24 HPI: Patient is a 68 y.o. 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 On exam, patient on HFNC 10L with SpO2 >94%, did not follow commands. Food particles noted on oral inspection. He was admitted to ICU and placed on BiPAP. CTH showed Subacute to chronic appearing small vessel ischemic changes within the right frontal parietal periventricular and subcortical white matter but no other  signs of acute infarct or hemorrhage. CXR showed minimal bibasilar atelectasis. Patient was repeatedly taking off BiPAP resulting in oxygen saturation dropping to 80's%. RT placed patient on 8L [place]. Patient has been NPO and SLP swallow evaluation ordered. Type of Study: Bedside Swallow Evaluation Previous Swallow Assessment: OP SLP for dysphagia 03/14/2021, most recent MBS: 12/20/2020 Diet Prior to this Study: NPO Temperature Spikes Noted: No Respiratory Status: Nasal cannula History of Recent Intubation: No Behavior/Cognition: Alert;Cooperative;Pleasant mood Oral Cavity Assessment: Within Functional Limits Oral Care Completed by SLP: No Oral Cavity - Dentition: Adequate natural dentition Vision: Functional for self-feeding Self-Feeding Abilities: Able to feed self Patient Positioning: Upright in bed Baseline Vocal Quality: Wet;Hoarse Volitional Cough: Weak Volitional Swallow: Able to elicit    Oral/Motor/Sensory Function Overall Oral Motor/Sensory Function: Mild impairment Lingual Strength: Reduced   Ice Chips     Thin Liquid Thin Liquid: Impaired Presentation: Self Fed;Straw Pharyngeal  Phase Impairments: Suspected delayed Swallow;Decreased hyoid-laryngeal movement;Wet Vocal Quality;Throat Clearing - Immediate;Throat Clearing - Delayed;Cough - Delayed    Nectar Thick Nectar Thick Liquid: Not tested   Honey Thick Honey Thick Liquid: Not tested   Puree Puree: Not tested   Solid  Solid: Not tested      Norleen IVAR Blase, MA, CCC-SLP Speech Therapy

## 2024-01-30 NOTE — Progress Notes (Signed)
 NAME:  George Nielsen, MRN:  [id number], DOB:  1955/11/23, LOS: 1 ADMISSION DATE:  01/29/2024, CONSULTATION DATE:  01/29/2024 REFERRING MD:  Pleas BIRCH, CHIEF COMPLAINT:  AMS, AHHRF  History of Present Illness:  George Nielsen is a 68 year old male with past medical history significant for Stage IV tongue base carcinoma s/p radiation/chemo (2016) w/ prior trach/PEG, COPD on CPAP and oxygen PRN, chronic pain, hypothyroidism, GERD, anemia, protein/calorie malnutrition who presented to ED via EMS for AMS, abnormal breathing, hypoxia. Patient reportedly eating breakfast this morning after taking his morning medications when he became unresponsive. EMS reported patient w/ pinpoint pupils and altered from baseline and was given Narcan with some improvement. Of note, family reports he was recently prescribed oxygen PRN for trouble breathing, especially in the morning and with exertion. Labs notable for respiratory acidosis w/ hypoxia, hypoalbuminemia, BNP 192, otherwise benign. PCCM consulted for ICU evaluation.   On exam, patient oxygenating well on HFNC 10L with SpO2 >94%. Patient did not follow commands, but localized to noxious stimuli and only opened eyes to oral suctioning and grabbed for the Yankauer. Food particles noted on oral inspection. Plan to admit to ICU for airway watch, place on BiPAP, start antibiotics, CTH/UA/UDS pending to eval AMS.   GOC with HCPOA, patient made DNR/DNI.   Pertinent  Medical History  HTN, OSA  Significant Hospital Events: Including procedures, antibiotic start and stop dates in addition to other pertinent events   11/5 Hypoxic, AMS>ED>BiPAP 11/6 BiPAP > HFNC  Interim History / Subjective:  VBG normalized after BiPAP last night  Objective    Blood pressure 131/73, pulse 80, temperature 97.6 F (36.4 C), temperature source Axillary, resp. rate 20, height 5' 9 (1.753 m), weight 63.3 kg, SpO2 93%.    Vent Mode: PCV;BIPAP FiO2 (%):  [40 %-60 %] 60 % Set  Rate:  [18 bmp] 18 bmp PEEP:  [8 cmH20] 8 cmH20   Intake/Output Summary (Last 24 hours) at 01/30/2024 9257 Last data filed at 01/30/2024 0600 Gross per 24 hour  Intake 885.22 ml  Output 500 ml  Net 385.22 ml   Filed Weights   01/30/24 0000 01/30/24 0500  Weight: 60.2 kg 63.3 kg    Examination: General: Chronically-ill cachetic male lying in bed Lungs: diminished throughout, rhonchi most notably in RLL Cardiovascular: NSR, no mrg Abdomen: soft, non distended, no TTP Extremities: warm, dry, no edema Neuro: alert and oriented x 4  Resolved problem list   Assessment and Plan  Acute hypoxic hypercarbic respiratory failure COPD; OSA, CPAP use at home; recently prescribed oxygen PRN C/f aspiration event Dysphagia, odynophagia  Cervical dystonia s/p ACDF (2022) Stage IV tongue base carcinoma s/p radiation/chemo (2016) prior trach/PEG Chronic cervical pain s/p radiation/chemo/ACDF Hypomagnesemia Saturating within goal on 6 L [place], vitals good otherwise. Some desaturations overnight but patient has OSA. CPAP now ordered at bedtime. Mg low, repleted. Will start Solumedrol to complete 5 day course. Transition Zosyn to Rocephin.  Zithromax  for COPD exacerbation. CXR today. Mg repleted -Trend vitals/labs -SLP eval -Palliative Consult -Stable for floor transfer  Hypothyroidism TSH is 9.150 , resume home Synthroid  pending swallow study   Labs   CBC: Recent Labs  Lab 01/29/24 1245 01/29/24 1252 01/29/24 1253 01/29/24 1300 01/29/24 1609 01/29/24 1947 01/30/24 0330  WBC 8.3  --   --   --  9.0  --  6.6  NEUTROABS 7.8*  --   --   --   --   --   --  HGB 12.4*   < > 13.6 11.9* 12.7* 11.9* 11.4*  HCT 38.5*   < > 40.0 35.0* 39.1 35.0* 35.2*  MCV 97.5  --   --   --  95.8  --  95.7  PLT 169  --   --   --  172  --  175   < > = values in this interval not displayed.    Basic Metabolic Panel: Recent Labs  Lab 01/29/24 1245 01/29/24 1252 01/29/24 1253 01/29/24 1300 01/29/24 1609  01/29/24 1947 01/30/24 0330  NA 140 141 140 139  --  141 141  K 4.2 4.2 4.1 4.1  --  3.9 3.6  CL 101  --  101  --   --   --  104  CO2 28  --   --   --   --   --  30  GLUCOSE 112*  --  108*  --   --   --  97  BUN 27*  --  29*  --   --   --  21  CREATININE 1.15  --  1.20  --  1.06  --  0.98  CALCIUM 8.1*  --   --   --   --   --  8.0*  MG  --   --   --   --   --   --  1.1*  PHOS  --   --   --   --   --   --  3.7   GFR: Estimated Creatinine Clearance: 62.8 mL/min (by C-G formula based on SCr of 0.98 mg/dL). Recent Labs  Lab 01/29/24 1245 01/29/24 1252 01/29/24 1609 01/30/24 0330  WBC 8.3  --  9.0 6.6  LATICACIDVEN  --  1.0  --   --     Liver Function Tests: Recent Labs  Lab 01/29/24 1245  AST 17  ALT 14  ALKPHOS 52  BILITOT 0.5  PROT 6.2*  ALBUMIN 3.1*   Recent Labs  Lab 01/29/24 1245  LIPASE 20   No results for input(s): AMMONIA in the last 168 hours.  ABG    Component Value Date/Time   PHART 7.309 (L) 01/29/2024 1947   PCO2ART 57.1 (H) 01/29/2024 1947   PO2ART 86 01/29/2024 1947   HCO3 28.7 (H) 01/29/2024 1947   TCO2 30 01/29/2024 1947   O2SAT 95 01/29/2024 1947     Coagulation Profile: Recent Labs  Lab 01/29/24 1245  INR 0.9    Cardiac Enzymes: No results for input(s): CKTOTAL, CKMB, CKMBINDEX, TROPONINI in the last 168 hours.  HbA1C: No results found for: HGBA1C  CBG: Recent Labs  Lab 01/29/24 1558 01/29/24 1943 01/29/24 2324 01/30/24 0325 01/30/24 0737  GLUCAP 91 92 88 95 100*    Review of Systems:     Past Medical History:  He,  has a past medical history of Allergy, Anemia, Anxiety, Arthritis, Cancer (HCC), COPD (chronic obstructive pulmonary disease) (HCC), Dyspnea, Emphysema of lung (HCC), GERD (gastroesophageal reflux disease), Hepatitis C, History of radiation therapy (11/10/14- 12/29/14), Hypertension, Hypothyroidism, PNA (pneumonia), Sleep apnea, Thyroid  disease, and Ulcer.   Surgical History:   Past Surgical  History:  Procedure Laterality Date   ANTERIOR CERVICAL DECOMP/DISCECTOMY FUSION N/A 05/27/2020   Procedure: Cervical Three-Four Anterior cervical decompression/discectomy/fusion;  Surgeon: Gillie Duncans, MD;  Location: [facility] OR;  Service: Neurosurgery;  Laterality: N/A;  anterior   BACK SURGERY     COLONOSCOPY WITH PROPOFOL  N/A 01/06/2020   Procedure: COLONOSCOPY WITH PROPOFOL ;  Surgeon: San Sandor GAILS, DO;  Location: [facility];  Service: Gastroenterology;  Laterality: N/A;   ELBOW SURGERY Left    HEMOSTASIS CLIP PLACEMENT  01/06/2020   Procedure: HEMOSTASIS CLIP PLACEMENT;  Surgeon: San Sandor GAILS, DO;  Location: [facility];  Service: Gastroenterology;;   POLYPECTOMY  01/06/2020   Procedure: POLYPECTOMY;  Surgeon: San Sandor GAILS, DO;  Location: [facility];  Service: Gastroenterology;;   SHOULDER SURGERY     TONSILLECTOMY     tracheotomy     WRIST SURGERY     right     Social History:   reports that he quit smoking about 8 years ago. His smoking use included cigarettes. He started smoking about 52 years ago. He has a 22 pack-year smoking history. He has never used smokeless tobacco. He reports that he does not drink alcohol and does not use drugs.   Family History:  His family history includes Cancer in his mother; Heart disease in his father. There is no history of Colon cancer, Stomach cancer, Pancreatic cancer, Esophageal cancer, or Rectal cancer.   Allergies Allergies  Allergen Reactions   Tiotropium Anaphylaxis and Anxiety   Benzyl Alcohol-Camphor-Menthol  Dermatitis   Budesonide-Formoterol Fumarate Other (See Comments)    Unknown    Pilocarpine Other (See Comments)    Sweating, Nasal discharge   Poison Oak Extract Dermatitis   Vancomycin Other (See Comments)    Unknown    Bee Venom Swelling   Hydrocodone  Other (See Comments)    Unknown    Vicodin [Hydrocodone -Acetaminophen ] Nausea And Vomiting     Home Medications  Prior to Admission medications    Medication Sig Start Date End Date Taking? Authorizing Provider  albuterol  (PROVENTIL  HFA;VENTOLIN  HFA) 108 (90 BASE) MCG/ACT inhaler Inhale 2 puffs into the lungs every 6 (six) hours as needed for wheezing or shortness of breath.    Yes [provider]  amLODipine  (NORVASC ) 10 MG tablet Take 10 mg by mouth daily.   Yes [provider]  chlorhexidine  (PERIDEX ) 0.12 % solution Use as directed 15 mLs in the mouth or throat 2 (two) times daily.   Yes [provider]  fluticasone-salmeterol (ADVAIR) 250-50 MCG/ACT AEPB Inhale 1 puff into the lungs in the morning and at bedtime.   Yes [provider]  loratadine  (CLARITIN ) 10 MG tablet Take 10 mg by mouth daily.   Yes [provider]  losartan (COZAAR) 50 MG tablet Take 25 mg by mouth daily. 12/05/23  Yes [provider]  meloxicam (MOBIC) 7.5 MG tablet Take 7.5 mg by mouth 2 (two) times daily as needed (back spasms).    Yes [provider]  pantoprazole  (PROTONIX ) 40 MG tablet Take 40 mg by mouth in the morning and at bedtime.    Yes [provider]  tiZANidine (ZANAFLEX) 4 MG tablet Take 4 mg by mouth 3 (three) times daily as needed for muscle spasms.   Yes [provider]  acetaminophen  (TYLENOL ) 500 MG tablet Take 1,000 mg by mouth every 8 (eight) hours as needed for moderate pain.    [provider]  buPROPion  (WELLBUTRIN  SR) 150 MG 12 hr tablet Take 150 mg by mouth 2 (two) times daily.    [provider]  cyclobenzaprine  (FLEXERIL ) 10 MG tablet Take 1 tablet (10 mg total) by mouth 3 (three) times daily as needed for muscle spasms. 05/28/20   Lanis Pupa, MD  gabapentin  (NEURONTIN ) 300 MG capsule Take 300-600 mg by mouth See admin instructions. Take 1 capsule (300  mg) by mouth in the morning, take 1 capsule (300 mg) by mouth at noon & take 3 capsules (900 mg) by mouth at night.    [provider]  hydrOXYzine  (ATARAX /VISTARIL ) 10 MG tablet  Take 10 mg by mouth 3 (three) times daily as needed for anxiety.    [provider]  ipratropium (ATROVENT ) 0.02 % nebulizer solution Take 0.5 mg by nebulization 4 (four) times daily as needed for wheezing or shortness of breath.     [provider]  ipratropium (ATROVENT ) 0.06 % nasal spray Place 1 spray into both nostrils 4 (four) times daily.    [provider]  levothyroxine  (SYNTHROID ) 112 MCG tablet Take 112 mcg by mouth daily before breakfast.    [provider]  lisinopril  (PRINIVIL ,ZESTRIL ) 20 MG tablet Take 20 mg by mouth daily.    [provider]  Magnesium  Oxide 420 MG TABS Take 210 mg by mouth daily.    [provider]  sodium chloride  (OCEAN) 0.65 % SOLN nasal spray Place 1 spray into both nostrils in the morning, at noon, and at bedtime.    [provider]  SODIUM CHLORIDE  PO Take 125 g by mouth daily.    [provider]  sodium fluoride  (FLUORISHIELD) 1.1 % GEL dental gel Place 1 application onto teeth in the morning and at bedtime.    [provider]  tamsulosin  (FLOMAX ) 0.4 MG CAPS capsule Take 0.8 mg by mouth at bedtime.     [provider]     Norman Lobstein, DO

## 2024-01-30 NOTE — Progress Notes (Signed)
 [facility] ADULT ICU REPLACEMENT PROTOCOL   The patient does apply for the [facility] Adult ICU Electrolyte Replacment Protocol based on the criteria listed below:   1.Exclusion criteria: TCTS, ECMO, Dialysis, and Myasthenia Gravis patients 2. Is GFR >/= 30 ml/min? Yes.    Patient's GFR today is >60 3. Is SCr </= 2? Yes.   Patient's SCr is 0.98 mg/dL 4. Did SCr increase >/= 0.5 in 24 hours? No. 5.Pt's weight >40kg  Yes.   6. Abnormal electrolyte(s): K+ = 3.6, Mg = 1.1  7. Electrolytes replaced per protocol 8.  Call MD STAT for K+ </= 2.5, Phos </= 1, or Mag </= 1 Physician:  Haze, eMD    Rosina LOISE Hamilton 01/30/2024 5:38 AM

## 2024-01-31 ENCOUNTER — Ambulatory Visit: Admitting: Neurology

## 2024-01-31 ENCOUNTER — Inpatient Hospital Stay (HOSPITAL_COMMUNITY)

## 2024-01-31 DIAGNOSIS — R0902 Hypoxemia: Secondary | ICD-10-CM

## 2024-01-31 DIAGNOSIS — E43 Unspecified severe protein-calorie malnutrition: Secondary | ICD-10-CM | POA: Insufficient documentation

## 2024-01-31 LAB — GLUCOSE, CAPILLARY
Glucose-Capillary: 103 mg/dL — ABNORMAL HIGH (ref 70–99)
Glucose-Capillary: 116 mg/dL — ABNORMAL HIGH (ref 70–99)
Glucose-Capillary: 125 mg/dL — ABNORMAL HIGH (ref 70–99)
Glucose-Capillary: 127 mg/dL — ABNORMAL HIGH (ref 70–99)
Glucose-Capillary: 90 mg/dL (ref 70–99)
Glucose-Capillary: 94 mg/dL (ref 70–99)

## 2024-01-31 LAB — BASIC METABOLIC PANEL WITH GFR
Anion gap: 10 (ref 5–15)
BUN: 12 mg/dL (ref 8–23)
CO2: 29 mmol/L (ref 22–32)
Calcium: 8.7 mg/dL — ABNORMAL LOW (ref 8.9–10.3)
Chloride: 101 mmol/L (ref 98–111)
Creatinine, Ser: 0.81 mg/dL (ref 0.61–1.24)
GFR, Estimated: >60 mL/min (ref >60)
Glucose, Bld: 103 mg/dL — ABNORMAL HIGH (ref 70–99)
Potassium: 3.8 mmol/L (ref 3.5–5.1)
Sodium: 140 mmol/L (ref 135–145)

## 2024-01-31 LAB — CBC
HCT: 36.8 % — ABNORMAL LOW (ref 39.0–52.0)
Hemoglobin: 12.2 g/dL — ABNORMAL LOW (ref 13.0–17.0)
MCH: 31 pg (ref 26.0–34.0)
MCHC: 33.2 g/dL (ref 30.0–36.0)
MCV: 93.4 fL (ref 80.0–100.0)
Platelets: 187 K/uL (ref 150–400)
RBC: 3.94 MIL/uL — ABNORMAL LOW (ref 4.22–5.81)
RDW: 14.9 % (ref 11.5–15.5)
WBC: 6.9 K/uL (ref 4.0–10.5)
nRBC: 0 % (ref 0.0–0.2)

## 2024-01-31 LAB — MAGNESIUM: Magnesium: 2.2 mg/dL (ref 1.7–2.4)

## 2024-01-31 MED ORDER — DEXTROSE-SODIUM CHLORIDE 5-0.45 % IV SOLN: INTRAVENOUS | Status: AC

## 2024-01-31 MED ORDER — HYDRALAZINE HCL 20 MG/ML IJ SOLN
10.0000 mg | Freq: Four times a day (QID) | INTRAMUSCULAR | Status: DC | PRN
  Administered 2024-01-31 – 2024-02-02 (×2): 10 mg via INTRAVENOUS
  Filled 2024-01-31 (×2): qty 1

## 2024-01-31 MED ORDER — ALBUTEROL SULFATE (2.5 MG/3ML) 0.083% IN NEBU
2.5000 mg | INHALATION_SOLUTION | Freq: Three times a day (TID) | RESPIRATORY_TRACT | Status: DC
  Administered 2024-01-31 – 2024-02-02 (×6): 2.5 mg via RESPIRATORY_TRACT
  Filled 2024-01-31 (×6): qty 3

## 2024-01-31 MED ORDER — MORPHINE SULFATE (PF) 2 MG/ML IV SOLN
2.0000 mg | INTRAVENOUS | Status: DC | PRN
Start: 2024-01-31 — End: 2024-02-07
  Administered 2024-01-31 – 2024-02-06 (×25): 2 mg via INTRAVENOUS
  Filled 2024-01-31 (×25): qty 1

## 2024-01-31 NOTE — Progress Notes (Addendum)
 Consultation Progress Note   Patient: George Nielsen FMW:[id number] DOB: November 06, 1955 DOA: 01/29/2024 DOS: the patient was seen and examined on 01/31/2024 Primary service: DibiaLandon BRAVO, MD  Brief hospital course: 68 year old male with past medical history significant for Stage IV tongue base carcinoma s/p radiation/chemo (2016) w/ prior trach/PEG, COPD on CPAP and oxygen PRN, chronic pain, hypothyroidism, GERD, anemia, protein/calorie malnutrition who presented to ED via EMS for AMS, abnormal breathing, hypoxia. Patient reportedly eating breakfast this morning after taking his morning medications when he became unresponsive. EMS reported patient w/ pinpoint pupils and altered from baseline and was given Narcan with some improvement. Of note, family reports he was recently prescribed oxygen PRN for trouble breathing, especially in the morning and with exertion. Labs notable for respiratory acidosis w/ hypoxia, hypoalbuminemia, BNP 192, otherwise benign. PCCM consulted for ICU evaluation. patient oxygenating well on HFNC 10L with SpO2 >94%. Patient did not follow commands, but localized to noxious stimuli and only opened eyes to oral suctioning and grabbed for the Yankauer. Food particles noted on oral inspection. He was admitted  to ICU for airway watch, place on BiPAP, started antibiotics Assessment and Plan: Acute on chronic hypoxic respi failure Likely acute on chronic hypercarbic failure On 5L of oxygen, d/w RN to wean down as tolerated. Likely acute on chronic hypercarbic failure  Acute metabolic encephalopathy- resolved  Dysphagia, odynophagia  Moderate Protein-calorie malnutrition; hypoalbuminemia Patient declined cortrak placement and states  He doesn't care if he misses meds or nutrition through the weekend. He jsut said he absolutely doesn't want to have a tube placed in his nose. He would, however, like to have his PEG replaced and wants it done as soon as possible so he can go home.  I just wanted to pass that along so whatever workup he needs for that to happen can get started.  Consult IR for PEG placement.   Cervical dystonia s/p ACDF (2022) Stage IV tongue base carcinoma s/p radiation/chemo (2016) prior trach/PEG      TRH will continue to follow the patient.  Subjective:  No acute complaints, states he wants to go home, he also requested for PEG tube placement.  Physical Exam: Vitals:   01/31/24 0927 01/31/24 1000 01/31/24 1100 01/31/24 1119  BP:  (!) 151/80 (!) 143/85   Pulse: 79 78 75 73  Resp: 15 14 20 14   Temp:    98.7 F (37.1 C)  TempSrc:    Oral  SpO2: 99% 99% 98% 99%  Weight:      Height:       General: Chronically-ill cachetic male lying in bed Lungs: diminished throughout, On 5L of oxygen Cardiovascular: NSR, no mrg Abdomen: soft, non distended, no TTP Extremities: warm, dry, no edema Neuro: alert and oriented x 4 Data Reviewed:    .CBC    Component Value Date/Time   WBC 6.9 01/31/2024 0408   RBC 3.94 (L) 01/31/2024 0408   HGB 12.2 (L) 01/31/2024 0408   HGB 14.5 11/01/2014 0833   HCT 36.8 (L) 01/31/2024 0408   HCT 41.9 11/01/2014 0833   PLT 187 01/31/2024 0408   PLT 224 11/01/2014 0833   MCV 93.4 01/31/2024 0408   MCV 92.6 11/01/2014 0833   MCH 31.0 01/31/2024 0408   MCHC 33.2 01/31/2024 0408   RDW 14.9 01/31/2024 0408   RDW 12.2 11/01/2014 0833   LYMPHSABS 0.2 (L) 01/29/2024 1245   LYMPHSABS 1.2 11/01/2014 0833   MONOABS 0.2 01/29/2024 1245   MONOABS 0.8 11/01/2014  9166   EOSABS 0.0 01/29/2024 1245   EOSABS 0.2 11/01/2014 0833   BASOSABS 0.0 01/29/2024 1245   BASOSABS 0.1 11/01/2014 0833    .CMP     Component Value Date/Time   NA 140 01/31/2024 0408   NA 136 11/01/2014 0833   K 3.8 01/31/2024 0408   K 4.1 11/01/2014 0833   CL 101 01/31/2024 0408   CO2 29 01/31/2024 0408   CO2 26 11/01/2014 0833   GLUCOSE 103 (H) 01/31/2024 0408   GLUCOSE 107 11/01/2014 0833   BUN 12 01/31/2024 0408   BUN 6.4 (L) 11/01/2014  0833   CREATININE 0.81 01/31/2024 0408   CREATININE 0.8 11/01/2014 0833   CALCIUM 8.7 (L) 01/31/2024 0408   CALCIUM 9.1 11/01/2014 0833   PROT 6.2 (L) 01/29/2024 1245   PROT 6.9 11/01/2014 0833   ALBUMIN 3.1 (L) 01/29/2024 1245   ALBUMIN 3.5 11/01/2014 0833   AST 17 01/29/2024 1245   AST 35 (H) 11/01/2014 0833   ALT 14 01/29/2024 1245   ALT 33 11/01/2014 0833   ALKPHOS 52 01/29/2024 1245   ALKPHOS 65 11/01/2014 0833   BILITOT 0.5 01/29/2024 1245   BILITOT 0.53 11/01/2014 0833   GFR 100.19 04/06/2010 1655   EGFR >90 11/01/2014 0833   GFRNONAA >60 01/31/2024 0408    Family Communication: None at bedside  Time spent: 35 minutes.  Author: Landon FORBES Baller, MD 01/31/2024 11:35 AM  For on call review [email or web address].

## 2024-01-31 NOTE — Plan of Care (Signed)
  Problem: Education: Goal: Knowledge of General Education information will improve Description: Including pain rating scale, medication(s)/side effects and non-pharmacologic comfort measures Outcome: Progressing   Problem: Clinical Measurements: Goal: Ability to maintain clinical measurements within normal limits will improve Outcome: Progressing Goal: Diagnostic test results will improve Outcome: Progressing   Problem: Clinical Measurements: Goal: Diagnostic test results will improve Outcome: Progressing   Problem: Nutrition: Goal: Adequate nutrition will be maintained Outcome: Not Progressing   Problem: Coping: Goal: Level of anxiety will decrease Outcome: Progressing   Problem: Pain Managment: Goal: General experience of comfort will improve and/or be controlled Outcome: Progressing

## 2024-01-31 NOTE — Progress Notes (Signed)
 Patient woke up and pulled off CPAP mask along with his leads.  Patient placed on 6L [place].  VSS.  RT will continue to monitor.

## 2024-01-31 NOTE — Plan of Care (Signed)
  Problem: Nutrition: Goal: Adequate nutrition will be maintained Outcome: Not Progressing (needs PEG placement on Monday, 11/10.  Refused small bowel feeding tube despite education.)   Problem: Clinical Measurements: Goal: Ability to maintain clinical measurements within normal limits will improve Outcome: Progressing Goal: Will remain free from infection Outcome: Progressing Goal: Diagnostic test results will improve Outcome: Progressing Goal: Respiratory complications will improve Outcome: Progressing Goal: Cardiovascular complication will be avoided Outcome: Progressing   Problem: Pain Managment: Goal: General experience of comfort will improve and/or be controlled Outcome: Progressing   Problem: Safety: Goal: Ability to remain free from injury will improve Outcome: Progressing

## 2024-01-31 NOTE — Progress Notes (Signed)
 eLink Physician-Brief Progress Note Patient Name: George Nielsen DOB: 1955/12/14 MRN: [id number]   Date of Service  01/31/2024  HPI/Events of Note  Maintenance fluids expired off the [facility]  and he is strict NPO, previously on  D5 1/2NS  eICU Interventions  Expired many hours and tolerated thus far, can reevaluate if he becomes hypotensive.  Anticipate enteral nutrition shortly.     Intervention Category Minor Interventions: Routine modifications to care plan (e.g. PRN medications for pain, fever)  Lyana Asbill 01/31/2024, 4:00 AM

## 2024-02-01 DIAGNOSIS — R0902 Hypoxemia: Secondary | ICD-10-CM | POA: Diagnosis not present

## 2024-02-01 LAB — COMPREHENSIVE METABOLIC PANEL WITH GFR
ALT: 13 U/L (ref 0–44)
AST: 15 U/L (ref 15–41)
Albumin: 2.8 g/dL — ABNORMAL LOW (ref 3.5–5.0)
Alkaline Phosphatase: 52 U/L (ref 38–126)
Anion gap: 14 (ref 5–15)
BUN: 9 mg/dL (ref 8–23)
CO2: 30 mmol/L (ref 22–32)
Calcium: 9 mg/dL (ref 8.9–10.3)
Chloride: 95 mmol/L — ABNORMAL LOW (ref 98–111)
Creatinine, Ser: 0.63 mg/dL (ref 0.61–1.24)
GFR, Estimated: >60 mL/min (ref >60)
Glucose, Bld: 88 mg/dL (ref 70–99)
Potassium: 3.7 mmol/L (ref 3.5–5.1)
Sodium: 139 mmol/L (ref 135–145)
Total Bilirubin: 0.4 mg/dL (ref 0.0–1.2)
Total Protein: 6 g/dL — ABNORMAL LOW (ref 6.5–8.1)

## 2024-02-01 LAB — GLUCOSE, CAPILLARY
Glucose-Capillary: 96 mg/dL (ref 70–99)
Glucose-Capillary: 83 mg/dL (ref 70–99)
Glucose-Capillary: 87 mg/dL (ref 70–99)
Glucose-Capillary: 91 mg/dL (ref 70–99)
Glucose-Capillary: 98 mg/dL (ref 70–99)
Glucose-Capillary: 113 mg/dL — ABNORMAL HIGH (ref 70–99)

## 2024-02-01 LAB — CBC
HCT: 40.4 % (ref 39.0–52.0)
Hemoglobin: 13.5 g/dL (ref 13.0–17.0)
MCH: 31 pg (ref 26.0–34.0)
MCHC: 33.4 g/dL (ref 30.0–36.0)
MCV: 92.7 fL (ref 80.0–100.0)
Platelets: 196 K/uL (ref 150–400)
RBC: 4.36 MIL/uL (ref 4.22–5.81)
RDW: 15 % (ref 11.5–15.5)
WBC: 7.4 K/uL (ref 4.0–10.5)
nRBC: 0 % (ref 0.0–0.2)

## 2024-02-01 LAB — RESP PANEL BY RT-PCR (RSV, FLU A&B, COVID)  RVPGX2
Influenza A by PCR: NEGATIVE
Influenza B by PCR: NEGATIVE
Resp Syncytial Virus by PCR: NEGATIVE
SARS Coronavirus 2 by RT PCR: NEGATIVE

## 2024-02-01 MED ORDER — DEXTROSE-SODIUM CHLORIDE 5-0.45 % IV SOLN: INTRAVENOUS | Status: AC

## 2024-02-01 NOTE — Progress Notes (Signed)
 Admission Notes  00:56H - Received patient from 60M on bed accompanied by nurse and nurse tech. Hooked on oxygen @ 3L [place] and IV infusion on right upper arm. Alert and oriented.  Safety precautions initiated: Bed wheels locked, side rails up and call bell within reach. Educated patient that he cannot drink or eat for now due to aspiration precaution. Hooked to tele monitor # [id number] 01:29H - Informed Respiratory Therapist that patient is for CPAP at night.

## 2024-02-01 NOTE — Progress Notes (Signed)
 Pt left unit at 0048.  Transferred in 3W bed to room 3W07 on 3L [place] with NT and this RN.  Pt remained on monitor during transport.  Care handed off to [employer].

## 2024-02-01 NOTE — Progress Notes (Addendum)
 Consultation Progress Note   Patient: George Nielsen FMW:[id number] DOB: 1956-01-07 DOA: 01/29/2024 DOS: the patient was seen and examined on 02/01/2024 Primary service: DibiaLandon BRAVO, MD  Brief hospital course: 68 year old male with past medical history significant for Stage IV tongue base carcinoma s/p radiation/chemo (2016) w/ prior trach/PEG, COPD on CPAP and oxygen PRN, chronic pain, hypothyroidism, GERD, anemia, protein/calorie malnutrition who presented to ED via EMS for AMS, abnormal breathing, hypoxia. Patient reportedly eating breakfast this morning after taking his morning medications when he became unresponsive. EMS reported patient w/ pinpoint pupils and altered from baseline and was given Narcan with some improvement. Of note, family reports he was recently prescribed oxygen PRN for trouble breathing, especially in the morning and with exertion. Labs notable for respiratory acidosis w/ hypoxia, hypoalbuminemia, BNP 192, otherwise benign. PCCM consulted for ICU evaluation. patient oxygenating well on HFNC 10L with SpO2 >94%. Patient did not follow commands, but localized to noxious stimuli and only opened eyes to oral suctioning and grabbed for the Yankauer. Food particles noted on oral inspection. He was admitted  to ICU for airway watch, place on BiPAP, started antibiotics Assessment and Plan: Acute on chronic hypoxic respi failure Likely acute on chronic hypercarbic failure On 5L of oxygen, d/w RN to wean down as tolerated. Likely acute on chronic hypercarbic failure  Acute metabolic encephalopathy- resolved  Hypertension -Suboptimally controlled - Currently n.p.o. Hydralazine as needed IV for SBP greater than 160.  Dysphagia, odynophagia  Moderate Protein-calorie malnutrition; hypoalbuminemia Patient declined cortrak placement and states  He doesn't care if he misses meds or nutrition through the weekend. He jsut said he absolutely doesn't want to have a tube placed in  his nose. He would, however, like to have his PEG replaced and wants it done as soon as possible so he can go home.  Consulted IR for PEG placement.   Cervical dystonia s/p ACDF (2022) Stage IV tongue base carcinoma s/p radiation/chemo (2016) prior trach/PEG - PT/OT consulted       TRH will continue to follow the patient.  Subjective:  No acute complaints, he awaits evaluation for PEG tube placement.  No acute overnight events. Physical Exam: Vitals:   02/01/24 0437 02/01/24 0500 02/01/24 0805 02/01/24 0825  BP: (!) 169/89  (!) 169/92   Pulse: 76  84 75  Resp:   15   Temp: 98.5 F (36.9 C)  97.7 F (36.5 C)   TempSrc: Oral  Oral   SpO2: 99%  100% 98%  Weight:  54 kg    Height:       General: Chronically-ill cachetic male lying in bed Lungs: diminished throughout, On 5L of oxygen Cardiovascular: NSR, no mrg Abdomen: soft, non distended, no TTP Extremities: warm, dry, no edema Neuro: alert and oriented x 4 Data Reviewed:    .CBC    Component Value Date/Time   WBC 7.4 02/01/2024 0853   RBC 4.36 02/01/2024 0853   HGB 13.5 02/01/2024 0853   HGB 14.5 11/01/2014 0833   HCT 40.4 02/01/2024 0853   HCT 41.9 11/01/2014 0833   PLT 196 02/01/2024 0853   PLT 224 11/01/2014 0833   MCV 92.7 02/01/2024 0853   MCV 92.6 11/01/2014 0833   MCH 31.0 02/01/2024 0853   MCHC 33.4 02/01/2024 0853   RDW 15.0 02/01/2024 0853   RDW 12.2 11/01/2014 0833   LYMPHSABS 0.2 (L) 01/29/2024 1245   LYMPHSABS 1.2 11/01/2014 0833   MONOABS 0.2 01/29/2024 1245   MONOABS 0.8 11/01/2014  9166   EOSABS 0.0 01/29/2024 1245   EOSABS 0.2 11/01/2014 0833   BASOSABS 0.0 01/29/2024 1245   BASOSABS 0.1 11/01/2014 0833    .CMP     Component Value Date/Time   NA 139 02/01/2024 0853   NA 136 11/01/2014 0833   K 3.7 02/01/2024 0853   K 4.1 11/01/2014 0833   CL 95 (L) 02/01/2024 0853   CO2 30 02/01/2024 0853   CO2 26 11/01/2014 0833   GLUCOSE 88 02/01/2024 0853   GLUCOSE 107 11/01/2014 0833   BUN  9 02/01/2024 0853   BUN 6.4 (L) 11/01/2014 0833   CREATININE 0.63 02/01/2024 0853   CREATININE 0.8 11/01/2014 0833   CALCIUM 9.0 02/01/2024 0853   CALCIUM 9.1 11/01/2014 0833   PROT 6.0 (L) 02/01/2024 0853   PROT 6.9 11/01/2014 0833   ALBUMIN 2.8 (L) 02/01/2024 0853   ALBUMIN 3.5 11/01/2014 0833   AST 15 02/01/2024 0853   AST 35 (H) 11/01/2014 0833   ALT 13 02/01/2024 0853   ALT 33 11/01/2014 0833   ALKPHOS 52 02/01/2024 0853   ALKPHOS 65 11/01/2014 0833   BILITOT 0.4 02/01/2024 0853   BILITOT 0.53 11/01/2014 0833   GFR 100.19 04/06/2010 1655   EGFR >90 11/01/2014 0833   GFRNONAA >60 02/01/2024 0853    Family Communication: None at bedside  Time spent: 35 minutes.  Author: Landon FORBES Baller, MD 02/01/2024 10:30 AM  For on call review [email or web address].

## 2024-02-01 NOTE — Evaluation (Signed)
 Physical Therapy Evaluation Patient Details Name: George Nielsen MRN: [id number] DOB: [birth year] Today's Date: 02/01/2024  History of Present Illness  The pt is a 68 yo male presenting 11/05 with abnormal breathing and somnolence. Pt given narcan by EMS with improvement. Pt admitted to ICU due to respiratory failure, on BiPAP 11/5-6. PMH includes: stage IV tongue base carcinoma s/p radiation and chemo, cervical dystonia s/p ACDF 2022, dysphagia, GERD, CKD, emphysema of lung, COPD on CPAP and oxygen PRN, chronic pain, hypothryoidism, GERD, anemia, and protein/calorie malnutrition.   Clinical Impression  Pt in bed upon arrival of PT, agreeable to evaluation at this time. Prior to admission the pt reports he was independent without need for DME, independent with all ADLs, and driving to appointments. The pt currently needed minA to complete most bed mobility, sit-stand transfers, and short bout of gait in his room due to deficits in strength, stability, activity tolerance, safety awareness, and insight to deficits. Pt is highly motivated to return home, but per CM note, he lives with his fiancee who works during the day. Pt will benefit from continued skilled PT acutely and intensive post-acute therapies to return to baseline independence for most successful return home. Also recommend OT evaluation as pt reports he was previously independent.     If plan is discharge home, recommend the following: A lot of help with walking and/or transfers;A little help with bathing/dressing/bathroom;Assistance with cooking/housework;Help with stairs or ramp for entrance   Can travel by private vehicle        Equipment Recommendations BSC/3in1  Recommendations for Other Services  OT consult    Functional Status Assessment Patient has had a recent decline in their functional status and demonstrates the ability to make significant improvements in function in a reasonable and predictable amount of time.      Precautions / Restrictions Precautions Precautions: Fall Recall of Precautions/Restrictions: Impaired Restrictions Weight Bearing Restrictions Per Provider Order: No      Mobility  Bed Mobility Overal bed mobility: Needs Assistance Bed Mobility: Supine to Sit     Supine to sit: Min assist, HOB elevated, Used rails     General bed mobility comments: minA to manage LE and trunk elevation, pt able to assist with BUE    Transfers Overall transfer level: Needs assistance Equipment used: 1 person hand held assist Transfers: Sit to/from Stand, Bed to chair/wheelchair/BSC Sit to Stand: Min assist   Step pivot transfers: Min assist       General transfer comment: minA to rise and steady, cues for stepping and spatial awareness. cues to initiate movement and for safety awareness    Ambulation/Gait Ambulation/Gait assistance: Min assist Gait Distance (Feet): 8 Feet Assistive device: 1 person hand held assist Gait Pattern/deviations: Step-through pattern, Decreased stride length, Narrow base of support (feet externally rotated) Gait velocity: decreased     General Gait Details: short strides with minA to steady, limited ankle ROM and feet maintained in external rotation, pt reports this is his baseline     Balance Overall balance assessment: Needs assistance Sitting-balance support: No upper extremity supported, Feet supported Sitting balance-Leahy Scale: Fair     Standing balance support: Single extremity supported, During functional activity Standing balance-Leahy Scale: Poor Standing balance comment: single UE support and miNA                             Pertinent Vitals/Pain Pain Assessment Pain Assessment: No/denies pain Pain Score: 0-No pain Pain  Intervention(s): Monitored during session    Home Living Family/patient expects to be discharged to:: Private residence Living Arrangements: Spouse/significant other Available Help at Discharge:  Family;Available PRN/intermittently (pt reports 24/7, but per chart his fiancee works during the day) Type of Home: House Home Access: Level entry       Home Layout: One level Home Equipment: Agricultural [employer] (2 wheels) Additional Comments: information from patient, no family present    Prior Function Prior Level of Function : Independent/Modified Independent;Driving             Mobility Comments: pt reports independent without DME, no recent falls, active in the home ADLs Comments: pt reports independence, per his fiancee: pt was driving to appointments prior to hospitalization.     Extremity/Trunk Assessment   Upper Extremity Assessment Upper Extremity Assessment: Defer to OT evaluation;Right hand dominant    Lower Extremity Assessment Lower Extremity Assessment: RLE deficits/detail;LLE deficits/detail RLE Deficits / Details: limited knee extension ROM with active ROM, grossly 4-/5 to MMT, feet externally rotated and limited ankle ROM bilaterally RLE Sensation: WNL RLE Coordination: WNL LLE Deficits / Details: limited knee extension ROM with active ROM, grossly 4-/5 to MMT, feet externally rotated and limited ankle ROM bilaterally LLE Sensation: WNL LLE Coordination: WNL    Cervical / Trunk Assessment Cervical / Trunk Assessment: Kyphotic;Other exceptions Cervical / Trunk Exceptions: cervical flexion and L rotation, chronic position after radiation  Communication   Communication Communication: Impaired Factors Affecting Communication: Reduced clarity of speech    Cognition Arousal: Alert Behavior During Therapy: Flat affect, Agitated   PT - Cognitive impairments: No family/caregiver present to determine baseline, Awareness, Initiation, Problem solving, Safety/Judgement                       PT - Cognition Comments: no family present to confirm baseline, pt oriented to situation but with poor insight to deficits, frustrated that he has not returned home yet  and continued to state I should have just left the other day in session. Pt also reports he has assist at home as needed but per CM note, his fiancee works during the day Following commands: Impaired Following commands impaired: Follows one step commands inconsistently, Follows one step commands with increased time     Cueing Cueing Techniques: Verbal cues     General Comments General comments (skin integrity, edema, etc.): VSS on 3L, SpO2 >92%    Exercises     Assessment/Plan    PT Assessment Patient needs continued PT services  PT Problem List Decreased strength;Decreased activity tolerance;Decreased balance;Decreased mobility;Decreased range of motion;Decreased safety awareness;Cardiopulmonary status limiting activity       PT Treatment Interventions DME instruction;Gait training;Stair training;Functional mobility training;Therapeutic activities;Therapeutic exercise;Balance training;Patient/family education    PT Goals (Current goals can be found in the Care Plan section)  Acute Rehab PT Goals Patient Stated Goal: to return home PT Goal Formulation: With patient Time For Goal Achievement: 02/15/24 Potential to Achieve Goals: Good    Frequency Min 2X/week        AM-PAC PT 6 Clicks Mobility  Outcome Measure Help needed turning from your back to your side while in a flat bed without using bedrails?: A Little Help needed moving from lying on your back to sitting on the side of a flat bed without using bedrails?: A Little Help needed moving to and from a bed to a chair (including a wheelchair)?: A Little Help needed standing up from a chair using your arms (e.g.,  wheelchair or bedside chair)?: A Little Help needed to walk in hospital room?: Total (<20 ft) Help needed climbing 3-5 steps with a railing? : Total 6 Click Score: 14    End of Session Equipment Utilized During Treatment: Gait belt;Oxygen Activity Tolerance: Patient tolerated treatment well;Patient limited by  fatigue Patient left: in chair;with call bell/phone within reach;with chair alarm set Nurse Communication: Mobility status PT Visit Diagnosis: Unsteadiness on feet (R26.81);Other abnormalities of gait and mobility (R26.89);Muscle weakness (generalized) (M62.81)    Time: 8389-8363 PT Time Calculation (min) (ACUTE ONLY): 26 min   Charges:   PT Evaluation $PT Eval Moderate Complexity: 1 Mod PT Treatments $Therapeutic Exercise: 8-22 mins PT General Charges $$ ACUTE PT VISIT: 1 Visit         Izetta Call, PT, DPT   Acute Rehabilitation Department Office [phone] Secure Chat Communication Preferred  Izetta JULIANNA Call 02/01/2024, 5:56 PM

## 2024-02-02 DIAGNOSIS — R0902 Hypoxemia: Secondary | ICD-10-CM | POA: Diagnosis not present

## 2024-02-02 LAB — GLUCOSE, CAPILLARY
Glucose-Capillary: 106 mg/dL — ABNORMAL HIGH (ref 70–99)
Glucose-Capillary: 129 mg/dL — ABNORMAL HIGH (ref 70–99)
Glucose-Capillary: 80 mg/dL (ref 70–99)
Glucose-Capillary: 85 mg/dL (ref 70–99)
Glucose-Capillary: 89 mg/dL (ref 70–99)
Glucose-Capillary: 94 mg/dL (ref 70–99)

## 2024-02-02 NOTE — Progress Notes (Signed)
  IR BRIEF PROGRESS NOTE:  IR has received the request for G-tube placement. It was reviewed and approved by IR attending, Dr. Karalee. IR is unable to accommodate over the weekend, and will attempt as soon as schedule allows. Patient is already NPO since 11/5, and will need to be NPO for no less than 8 hours prior to procedure. Lovenox should also be held no less than 24 hours/ 1 dose prior to procedure. IR will ensure adequate A/C hold and NPO status prior to proceeding. Due to scheduling overload on 11/10, this request will likely not occur prior to 11/11 in am at earliest.   Electronically Signed: Carlin DELENA Griffon, PA-C 02/02/2024, 2:07 PM

## 2024-02-02 NOTE — Progress Notes (Signed)

## 2024-02-02 NOTE — Progress Notes (Signed)
 Consultation Progress Note   Patient: George Nielsen FMW:[id number] DOB: [birth year] DOA: 01/29/2024 DOS: the patient was seen and examined on 02/02/2024 Primary service: DibiaLandon BRAVO, MD  Brief hospital course: 68 year old male with past medical history significant for Stage IV tongue base carcinoma s/p radiation/chemo (2016) w/ prior trach/PEG, COPD on CPAP and oxygen PRN, chronic pain, hypothyroidism, GERD, anemia, protein/calorie malnutrition who presented to ED via EMS for AMS, abnormal breathing, hypoxia. Patient reportedly eating breakfast this morning after taking his morning medications when he became unresponsive. EMS reported patient w/ pinpoint pupils and altered from baseline and was given Narcan with some improvement. Of note, family reports he was recently prescribed oxygen PRN for trouble breathing, especially in the morning and with exertion. Labs notable for respiratory acidosis w/ hypoxia, hypoalbuminemia, BNP 192, otherwise benign. PCCM consulted for ICU evaluation. patient oxygenating well on HFNC 10L with SpO2 >94%. Patient did not follow commands, but localized to noxious stimuli and only opened eyes to oral suctioning and grabbed for the Yankauer. Food particles noted on oral inspection. He was admitted  to ICU for airway watch, place on BiPAP, started antibiotics Assessment and Plan: Acute on chronic hypoxic respi failure Likely acute on chronic hypercarbic failure On 3L of oxygen, d/w RN to wean down as tolerated. Likely acute on chronic hypercarbic failure  Acute metabolic encephalopathy- resolved  Hypertension -Suboptimally controlled - Currently n.p.o. Hydralazine as needed IV for SBP greater than 160.  Dysphagia, odynophagia  Moderate Protein-calorie malnutrition; hypoalbuminemia Patient declined cortrak placement and states  He doesn't care if he misses meds or nutrition through the weekend. He jsut said he absolutely doesn't want to have a tube placed in  his nose. He would, however, like to have his PEG replaced and wants it done as soon as possible so he can go home.  Consulted IR for PEG placement.   Cervical dystonia s/p ACDF (2022) Stage IV tongue base carcinoma s/p radiation/chemo (2016) prior trach/PEG - PT/OT consulted  - Evaluation reviewed, patient is a potential candidate for inpatient rehab.  Consult has been placed.      TRH will continue to follow the patient.  Subjective:  No acute complaints, he awaits evaluation for PEG tube placement.  No acute overnight events.  He is upset that he has to wait till Monday for PEG tube evaluation/placement. Physical Exam: Vitals:   02/02/24 0444 02/02/24 0504 02/02/24 0738 02/02/24 0822  BP: (!) 157/83   (!) 149/85  Pulse: 86  84 83  Resp:   14 12  Temp:    98 F (36.7 C)  TempSrc:    Oral  SpO2:   97% 96%  Weight:  53.6 kg    Height:       General: Chronically-ill cachetic male lying in bed Lungs: diminished throughout, On 5L of oxygen Cardiovascular: NSR, no mrg Abdomen: soft, non distended, no TTP Extremities: warm, dry, no edema Neuro: alert and oriented x 4 Data Reviewed:    .CBC    Component Value Date/Time   WBC 7.4 02/01/2024 0853   RBC 4.36 02/01/2024 0853   HGB 13.5 02/01/2024 0853   HGB 14.5 11/01/2014 0833   HCT 40.4 02/01/2024 0853   HCT 41.9 11/01/2014 0833   PLT 196 02/01/2024 0853   PLT 224 11/01/2014 0833   MCV 92.7 02/01/2024 0853   MCV 92.6 11/01/2014 0833   MCH 31.0 02/01/2024 0853   MCHC 33.4 02/01/2024 0853   RDW 15.0 02/01/2024 0853  RDW 12.2 11/01/2014 0833   LYMPHSABS 0.2 (L) 01/29/2024 1245   LYMPHSABS 1.2 11/01/2014 0833   MONOABS 0.2 01/29/2024 1245   MONOABS 0.8 11/01/2014 0833   EOSABS 0.0 01/29/2024 1245   EOSABS 0.2 11/01/2014 0833   BASOSABS 0.0 01/29/2024 1245   BASOSABS 0.1 11/01/2014 0833    .CMP     Component Value Date/Time   NA 139 02/01/2024 0853   NA 136 11/01/2014 0833   K 3.7 02/01/2024 0853   K 4.1  11/01/2014 0833   CL 95 (L) 02/01/2024 0853   CO2 30 02/01/2024 0853   CO2 26 11/01/2014 0833   GLUCOSE 88 02/01/2024 0853   GLUCOSE 107 11/01/2014 0833   BUN 9 02/01/2024 0853   BUN 6.4 (L) 11/01/2014 0833   CREATININE 0.63 02/01/2024 0853   CREATININE 0.8 11/01/2014 0833   CALCIUM 9.0 02/01/2024 0853   CALCIUM 9.1 11/01/2014 0833   PROT 6.0 (L) 02/01/2024 0853   PROT 6.9 11/01/2014 0833   ALBUMIN 2.8 (L) 02/01/2024 0853   ALBUMIN 3.5 11/01/2014 0833   AST 15 02/01/2024 0853   AST 35 (H) 11/01/2014 0833   ALT 13 02/01/2024 0853   ALT 33 11/01/2014 0833   ALKPHOS 52 02/01/2024 0853   ALKPHOS 65 11/01/2014 0833   BILITOT 0.4 02/01/2024 0853   BILITOT 0.53 11/01/2014 0833   GFR 100.19 04/06/2010 1655   EGFR >90 11/01/2014 0833   GFRNONAA >60 02/01/2024 0853    Family Communication: None at bedside  Time spent: 35 minutes.  Author: Landon FORBES Baller, MD 02/02/2024 11:28 AM  For on call review [email or web address].

## 2024-02-02 NOTE — Progress Notes (Signed)
 OT Cancellation Note  Patient Details Name: George Nielsen MRN: [id number] DOB: 04-25-1955   Cancelled Treatment:    Reason Eval/Treat Not Completed: Other (comment) Attempted to see pt this AM, pt adamantly refusing, kept repeating I just need to get out of here and I just need to go. Despite encouragement and reason for OT consult for dispo planning, pt continued to refuse without much elaboration. Will continue efforts.   Dionne Rossa D., MSOT, OTR/L Acute Rehabilitation Services [phone] Secure Chat Preferred  Allycia Pitz 02/02/2024, 8:41 AM

## 2024-02-02 NOTE — Plan of Care (Signed)
  Problem: Clinical Measurements: Goal: Ability to maintain clinical measurements within normal limits will improve Outcome: Progressing Goal: Will remain free from infection Outcome: Progressing Goal: Diagnostic test results will improve Outcome: Progressing Goal: Respiratory complications will improve Outcome: Progressing Goal: Cardiovascular complication will be avoided Outcome: Progressing   Problem: Activity: Goal: Risk for activity intolerance will decrease Outcome: Progressing   Problem: Pain Managment: Goal: General experience of comfort will improve and/or be controlled Outcome: Progressing   Problem: Safety: Goal: Ability to remain free from injury will improve Outcome: Progressing   Problem: Skin Integrity: Goal: Risk for impaired skin integrity will decrease Outcome: Progressing

## 2024-02-03 ENCOUNTER — Inpatient Hospital Stay (HOSPITAL_COMMUNITY)

## 2024-02-03 ENCOUNTER — Other Ambulatory Visit: Payer: Self-pay

## 2024-02-03 ENCOUNTER — Encounter (HOSPITAL_COMMUNITY): Payer: Self-pay

## 2024-02-03 DIAGNOSIS — R0902 Hypoxemia: Secondary | ICD-10-CM | POA: Diagnosis not present

## 2024-02-03 LAB — GLUCOSE, CAPILLARY
Glucose-Capillary: 77 mg/dL (ref 70–99)
Glucose-Capillary: 82 mg/dL (ref 70–99)
Glucose-Capillary: 86 mg/dL (ref 70–99)
Glucose-Capillary: 87 mg/dL (ref 70–99)
Glucose-Capillary: 90 mg/dL (ref 70–99)
Glucose-Capillary: 95 mg/dL (ref 70–99)

## 2024-02-03 LAB — CULTURE, BLOOD (ROUTINE X 2)
Culture: NO GROWTH
Culture: NO GROWTH
Special Requests: ADEQUATE
Special Requests: ADEQUATE

## 2024-02-03 MED ORDER — BARIUM SULFATE 2 % PO SUSP
ORAL | Status: AC
  Filled 2024-02-03: qty 1

## 2024-02-03 MED ORDER — ENOXAPARIN SODIUM 40 MG/0.4ML IJ SOSY: 40.0000 mg | PREFILLED_SYRINGE | INTRAMUSCULAR | Status: DC

## 2024-02-03 MED ORDER — ENOXAPARIN SODIUM 40 MG/0.4ML IJ SOSY
40.0000 mg | PREFILLED_SYRINGE | INTRAMUSCULAR | Status: DC
  Administered 2024-02-05 – 2024-02-06 (×2): 40 mg via SUBCUTANEOUS
  Filled 2024-02-03 (×2): qty 0.4

## 2024-02-03 MED ORDER — BARIUM SULFATE 2 % PO SUSP: 450.0000 mL | Freq: Once | ORAL | Status: DC

## 2024-02-03 MED ORDER — CEFAZOLIN SODIUM-DEXTROSE 2-4 GM/100ML-% IV SOLN: 2.0000 g | INTRAVENOUS | Status: AC

## 2024-02-03 NOTE — Progress Notes (Signed)
 PT Cancellation Note  Patient Details Name: George Nielsen MRN: [id number] DOB: March 07, 1956   Cancelled Treatment:    Reason Eval/Treat Not Completed: Patient declined, no reason specified (pt very upset that surgery was postponed until tomorrow. Declined at this time. Will follow up as able and appropriate. Asked to let him rest for the rest of today.)  Dorothyann Maier, DPT, CLT  Acute Rehabilitation Services Office: [phone] (Secure chat preferred)   Dorothyann VEAR Maier 02/03/2024, 1:28 PM

## 2024-02-03 NOTE — Progress Notes (Signed)
 Consultation Progress Note   Patient: George Nielsen FMW:[id number] DOB: 13-Aug-1955 DOA: 01/29/2024 DOS: the patient was seen and examined on 02/03/2024 Primary service: DibiaLandon BRAVO, MD  Brief hospital course: 68 year old male with past medical history significant for Stage IV tongue base carcinoma s/p radiation/chemo (2016) w/ prior trach/PEG, COPD on CPAP and oxygen PRN, chronic pain, hypothyroidism, GERD, anemia, protein/calorie malnutrition who presented to ED via EMS for AMS, abnormal breathing, hypoxia. Patient reportedly eating breakfast this morning after taking his morning medications when he became unresponsive. EMS reported patient w/ pinpoint pupils and altered from baseline and was given Narcan with some improvement. Of note, family reports he was recently prescribed oxygen PRN for trouble breathing, especially in the morning and with exertion. Labs notable for respiratory acidosis w/ hypoxia, hypoalbuminemia, BNP 192, otherwise benign. PCCM consulted for ICU evaluation. patient oxygenating well on HFNC 10L with SpO2 >94%. Patient did not follow commands, but localized to noxious stimuli and only opened eyes to oral suctioning and grabbed for the Yankauer. Food particles noted on oral inspection and food particles suctioned out of airway.[name] He was admitted  to ICU for airway watch, place on BiPAP, started antibiotics.  Patient has been n.p.o. since 11/5 and speech therapy recommended strict n.p.o. Patient declined contract placement and has requested placement of a PEG tube.  IR consulted and plan is to place PEG tube 02/04/2024. Assessment and Plan: Acute on chronic hypoxic respi failure Likely acute on chronic hypercarbic failure On 3L of oxygen, d/w RN to wean down as tolerated. Likely acute on chronic hypercarbic failure  Acute metabolic encephalopathy- resolved  Hypertension -Suboptimally controlled - Currently n.p.o. Hydralazine as needed IV for SBP greater than  160.  Dysphagia, odynophagia  Severe protein-calorie malnutrition; hypoalbuminemia Patient made NPO since 11/5 speech evaluation commendation Patient declined cortrak placement and states  He doesn't care if he misses meds or nutrition through the weekend. He jsut said he absolutely doesn't want to have a tube placed in his nose. He would, however, like to have his PEG replaced  Consulted IR for PEG placement. PEG placement 02/04/2024.   Cervical dystonia s/p ACDF (2022) Stage IV tongue base carcinoma s/p radiation/chemo (2016) prior trach/PEG - PT/OT consulted  - Evaluation reviewed, patient is a potential candidate for inpatient rehab.  Consult has been placed.      TRH will continue to follow the patient.  Subjective:  No acute complaints, he awaits evaluation for PEG tube placement.  No acute overnight events.  He is upset that he has to wait till Tuesday for PEG tube evaluation/placement. Physical Exam: Vitals:   02/03/24 0051 02/03/24 0450 02/03/24 0500 02/03/24 0745  BP: 135/87   (!) 147/88  Pulse: 70   70  Resp:  12  15  Temp: 98.2 F (36.8 C) 97.8 F (36.6 C)  97.9 F (36.6 C)  TempSrc: Oral Oral  Oral  SpO2:    98%  Weight:   58.2 kg   Height:       General: Chronically-ill cachetic male lying in bed Lungs: diminished throughout, On 3L of oxygen Cardiovascular: NSR, no mrg Abdomen: soft, non distended, no TTP Extremities: warm, dry, no edema Neuro: alert and oriented x 4 Data Reviewed:    .CBC    Component Value Date/Time   WBC 7.4 02/01/2024 0853   RBC 4.36 02/01/2024 0853   HGB 13.5 02/01/2024 0853   HGB 14.5 11/01/2014 0833   HCT 40.4 02/01/2024 0853   HCT 41.9  11/01/2014 0833   PLT 196 02/01/2024 0853   PLT 224 11/01/2014 0833   MCV 92.7 02/01/2024 0853   MCV 92.6 11/01/2014 0833   MCH 31.0 02/01/2024 0853   MCHC 33.4 02/01/2024 0853   RDW 15.0 02/01/2024 0853   RDW 12.2 11/01/2014 0833   LYMPHSABS 0.2 (L) 01/29/2024 1245   LYMPHSABS 1.2  11/01/2014 0833   MONOABS 0.2 01/29/2024 1245   MONOABS 0.8 11/01/2014 0833   EOSABS 0.0 01/29/2024 1245   EOSABS 0.2 11/01/2014 0833   BASOSABS 0.0 01/29/2024 1245   BASOSABS 0.1 11/01/2014 0833    .CMP     Component Value Date/Time   NA 139 02/01/2024 0853   NA 136 11/01/2014 0833   K 3.7 02/01/2024 0853   K 4.1 11/01/2014 0833   CL 95 (L) 02/01/2024 0853   CO2 30 02/01/2024 0853   CO2 26 11/01/2014 0833   GLUCOSE 88 02/01/2024 0853   GLUCOSE 107 11/01/2014 0833   BUN 9 02/01/2024 0853   BUN 6.4 (L) 11/01/2014 0833   CREATININE 0.63 02/01/2024 0853   CREATININE 0.8 11/01/2014 0833   CALCIUM 9.0 02/01/2024 0853   CALCIUM 9.1 11/01/2014 0833   PROT 6.0 (L) 02/01/2024 0853   PROT 6.9 11/01/2014 0833   ALBUMIN 2.8 (L) 02/01/2024 0853   ALBUMIN 3.5 11/01/2014 0833   AST 15 02/01/2024 0853   AST 35 (H) 11/01/2014 0833   ALT 13 02/01/2024 0853   ALT 33 11/01/2014 0833   ALKPHOS 52 02/01/2024 0853   ALKPHOS 65 11/01/2014 0833   BILITOT 0.4 02/01/2024 0853   BILITOT 0.53 11/01/2014 0833   GFR 100.19 04/06/2010 1655   EGFR >90 11/01/2014 0833   GFRNONAA >60 02/01/2024 0853    Family Communication: None at bedside  Time spent: 35 minutes.  Author: Landon FORBES Baller, MD 02/03/2024 10:55 AM  For on call review [email or web address].

## 2024-02-03 NOTE — Progress Notes (Signed)
Occupational Therapy Treatment Patient Details Name: George Nielsen MRN: [id number] DOB: Aug 16, 1955 Today's Date: 02/03/2024   History of present illness The pt is a 68 yo male presenting 11/05 with abnormal breathing and somnolence. Pt given narcan by EMS with improvement. Pt admitted to ICU due to respiratory failure, on BiPAP 11/5-6. PMH includes: stage IV tongue base carcinoma s/p radiation and chemo, cervical dystonia s/p ACDF 2022, dysphagia, GERD, CKD, emphysema of lung, COPD on CPAP and oxygen PRN, chronic pain, hypothryoidism, GERD, anemia, and protein/calorie malnutrition.   OT comments  PTA, pt lived with fiance and was mod I for basic self care tasks. Upon eval, pt presents with generalized weakness, decreased awareness, balance, strength, and problem solving. Pt needing up to CGA for functional mobility and min A for LB ADL. Able to stand at sink with CGA approaching supervision for oral care routine. Will continue to follow acutely. Recommending HHOT at discharge; per chart review, pt not interested in post-acute inpatient rehab.      If plan is discharge home, recommend the following:  A little help with walking and/or transfers;A little help with bathing/dressing/bathroom;Assistance with cooking/housework;Assist for transportation;Help with stairs or ramp for entrance   Equipment Recommendations  BSC/3in1    Recommendations for Other Services      Precautions / Restrictions Precautions Precautions: Fall Recall of Precautions/Restrictions: Impaired Restrictions Weight Bearing Restrictions Per Provider Order: No       Mobility Bed Mobility Overal bed mobility: Needs Assistance Bed Mobility: Supine to Sit, Sit to Supine     Supine to sit: Contact guard Sit to supine: Supervision   General bed mobility comments: increased time    Transfers Overall transfer level: Needs assistance Equipment used: None Transfers: Sit to/from Stand Sit to Stand: Contact  guard assist                 Balance Overall balance assessment: Needs assistance Sitting-balance support: No upper extremity supported, Feet supported Sitting balance-Leahy Scale: Fair     Standing balance support: Single extremity supported, During functional activity Standing balance-Leahy Scale: Fair Standing balance comment: unable to tolerate additional challenge but no LOB with ambulation to sink                           ADL either performed or assessed with clinical judgement   ADL Overall ADL's : Needs assistance/impaired Eating/Feeding: NPO   Grooming: Contact guard assist;Standing   Upper Body Bathing: Set up;Sitting   Lower Body Bathing: Minimal assistance;Sit to/from stand   Upper Body Dressing : Set up;Sitting   Lower Body Dressing: Minimal assistance;Sit to/from stand   Toilet Transfer: Contact guard assist           Functional mobility during ADLs: Contact guard assist      Extremity/Trunk Assessment Upper Extremity Assessment Upper Extremity Assessment: Generalized weakness   Lower Extremity Assessment Lower Extremity Assessment: Defer to PT evaluation   Cervical / Trunk Assessment Cervical / Trunk Assessment: Kyphotic;Other exceptions Cervical / Trunk Exceptions: cervical flexion and L rotation, chronic position after radiation, cachetic    Vision Patient Visual Report: No change from baseline     Perception     Praxis     Communication Communication Communication: Impaired Factors Affecting Communication: Reduced clarity of speech   Cognition Arousal: Alert Behavior During Therapy: Flat affect Cognition: No family/caregiver present to determine baseline, Cognition impaired     Awareness: Online awareness impaired Memory impairment (select all impairments):  Short-term memory, Declarative long-term memory Attention impairment (select first level of impairment): Selective attention Executive functioning impairment  (select all impairments): Organization, Problem solving OT - Cognition Comments: pt follows one step commands, able to sequence basic tasks. some word finding difficulties noted intermittently                 Following commands: Impaired Following commands impaired: Follows one step commands with increased time      Cueing   Cueing Techniques: Verbal cues  Exercises      Shoulder Instructions       General Comments VSS on 3L    Pertinent Vitals/ Pain       Pain Assessment Pain Assessment: No/denies pain  Home Living Family/patient expects to be discharged to:: Private residence Living Arrangements: Spouse/significant other Available Help at Discharge: Family;Available PRN/intermittently (pt reported 24/7 but per chart, fiance works during the day) Type of Home: House Home Access: Level entry     Home Layout: One level     Bathroom Shower/Tub: Chief Strategy Officer: Standard     Home Equipment: Agricultural Consultant (2 wheels)          Prior Functioning/Environment              Frequency  Min 2X/week        Progress Toward Goals  OT Goals(current goals can now be found in the care plan section)     Acute Rehab OT Goals Patient Stated Goal: get better OT Goal Formulation: With patient Time For Goal Achievement: 02/21/2024 Potential to Achieve Goals: Good  Plan      Co-evaluation                 AM-PAC OT 6 Clicks Daily Activity     Outcome Measure   Help from another person eating meals?: Total Help from another person taking care of personal grooming?: A Little Help from another person toileting, which includes using toliet, bedpan, or urinal?: A Little Help from another person bathing (including washing, rinsing, drying)?: A Little Help from another person to put on and taking off regular upper body clothing?: A Little Help from another person to put on and taking off regular lower body clothing?: A Little 6 Click  Score: 16    End of Session Equipment Utilized During Treatment: Oxygen  OT Visit Diagnosis: Unsteadiness on feet (R26.81);Muscle weakness (generalized) (M62.81);Other symptoms and signs involving cognitive function;Other abnormalities of gait and mobility (R26.89)   Activity Tolerance Patient tolerated treatment well   Patient Left in bed;with call bell/phone within reach;with bed alarm set   Nurse Communication Mobility status        Time: 9066-8994 OT Time Calculation (min): 32 min  Charges: OT General Charges $OT Visit: 1 Visit OT Evaluation $OT Eval Moderate Complexity: 1 Mod OT Treatments $Self Care/Home Management : 8-22 mins  George Nielsen, OTR/L [facility] Acute Rehabilitation Office: [phone]   George JONETTA Lebron 02/03/2024, 12:19 PM

## 2024-02-03 NOTE — Progress Notes (Signed)
 Inpatient Rehab Coordinator Note:  I met with patient at bedside to discuss CIR recommendations and goals/expectations of CIR stay.  We reviewed 3 hrs/day of therapy, physician follow up, and average length of stay 2 weeks (dependent upon progress) with goals of mod I. Patient really wants to go home asap with home health. Does not want CIR at this time. Will sign off.   Rehab Admissons Coordinator Terianne Thaker, [place], [place] [phone]

## 2024-02-03 NOTE — Plan of Care (Signed)
   Problem: Education: Goal: Knowledge of General Education information will improve Description: Including pain rating scale, medication(s)/side effects and non-pharmacologic comfort measures Outcome: Progressing   Problem: Clinical Measurements: Goal: Will remain free from infection Outcome: Progressing Goal: Respiratory complications will improve Outcome: Progressing   Problem: Activity: Goal: Risk for activity intolerance will decrease Outcome: Progressing

## 2024-02-03 NOTE — TOC Initial Note (Signed)
 Transition of Care [facility]) - Initial/Assessment Note    Patient Details  Name: George Nielsen MRN: [id number] Date of Birth: 03/21/1956  [facility] CM/SW Contact:    Landry DELENA Senters, RN Phone Number: 02/03/2024, 2:09 PM  Clinical Narrative:                  Patient comes from home with fiance, who is able to provide support and transportation when needed. Patient manages his own medications, has PCP through [place].   Plan for PEG tube placement tomorrow.   Patient does not have preference on enteral feeding supplier. CM sent info to [facility] with [employer]. They will have to get insurance approval through [place]. [employer] will deliver enteral feedings to the home and educate patient at bedside as needed.  CM will see patient tomorrow to arrange [facility] services for d/c  Expected Discharge Plan:  (tbd) Barriers to Discharge: Continued Medical Work up   Patient Goals and CMS Choice            Expected Discharge Plan and Services       Living arrangements for the past 2 months: Single Family Home                                      Prior Living Arrangements/Services Living arrangements for the past 2 months: Single Family Home Lives with:: Self, Significant Other Patient language and need for interpreter reviewed:: Yes Do you feel safe going back to the place where you live?: Yes      Need for Family Participation in Patient Care: Yes (Comment) Care giver support system in place?: Yes (comment) Current home services: DME (walker, shower seat, CPAP, home oxygen 2-4L) Criminal Activity/Legal Involvement Pertinent to Current Situation/Hospitalization: No - Comment as needed  Activities of Daily Living      Permission Sought/Granted Permission sought to share information with : Family Supports Permission granted to share information with : No (Contact information on chart)  Share Information with NAME: Berwyn Bud     Permission granted to share info w  Relationship: Significant Other  Permission granted to share info w Contact Information: [phone]  Emotional Assessment Appearance:: Developmentally appropriate Attitude/Demeanor/Rapport: Engaged Affect (typically observed): Calm Orientation: : Oriented to Self, Oriented to Place, Oriented to  Time, Oriented to Situation Alcohol / Substance Use: Not Applicable Psych Involvement: No (comment)  Admission diagnosis:  Hypoxia [R09.02] Patient Active Problem List   Diagnosis Date Noted   Protein-calorie malnutrition, severe 01/31/2024   Hypoxia 01/29/2024   HNP (herniated nucleus pulposus) with myelopathy, cervical 05/27/2020   Positive FIT (fecal immunochemical test)    Polyp of ascending colon    Adenomatous polyp of sigmoid colon    Diverticulosis of colon without hemorrhage    Grade II internal hemorrhoids    Malignant neoplasm of base of tongue (HCC) 10/27/2014   Chronic kidney disease    COPD (chronic obstructive pulmonary disease) (HCC) 07/10/2012   Chest pain 07/10/2012   HTN (hypertension) 07/10/2012   Hyponatremia 07/10/2012   TOBACCO USE 02/07/2010   PCP:  [facility]   [id number] - [place], [place] - [address] [place] [address] Phone: [phone] Fax: [phone]  [facility] - [place], [place] - [address] [place] [address] Phone: [phone] Fax: [phone]     Social Drivers of Health (  SDOH) Social History: SDOH Screenings   Food Insecurity: No Food Insecurity (06/14/2020)   Received from [facility]  Social Connections: Unknown (08/07/2021)   Received from [facility]  Tobacco Use: Medium Risk (12/02/2023)   Received from [facility]   SDOH Interventions:     Readmission Risk Interventions     No data to display

## 2024-02-03 NOTE — Consult Note (Signed)
 Chief Complaint: Patient was seen in consultation today for dysphagia; percutaneous gastric tube placement Chief Complaint  Patient presents with   Respiratory Distress   at the request of Dr SHAUNNA Dibia  Supervising Physician: Jenna Hacker  Patient Status: [facility] - In-pt  History of Present Illness: George Nielsen is a 68 y.o. male   FULL Code status per pt  Tongue base cancer 2016 Prior G tube placement Consequently removed COPD; Protein calorie malnutrition EMS called to home for SOB and AMS Noted to have resp acidosis and hypoxia Food particles noted in airway--- speech rec strict npo Declines Cortrak Is agreeable to replace/new placement of percutaneous gastric tube  Anatomy approved per Dr Luverne Planned in IR 11/11   Past Medical History:  Diagnosis Date   Allergy    Anemia    Anxiety    Arthritis    Cancer (HCC)    tongue and neck lymph nodes   COPD (chronic obstructive pulmonary disease) (HCC)    Dyspnea    climbing stairs, walking distance   Emphysema of lung (HCC)    GERD (gastroesophageal reflux disease)    Hepatitis C    treated   History of radiation therapy 11/10/14- 12/29/14   BOT and bilateral neck/ 70 Gy in 35 fractions to gross disease, 63 Gy in 35 fractions to high risk nodal echelons, and 56 Gy in 35 fraction to intermediate risk nodal echelons.    Hypertension    Hypothyroidism    PNA (pneumonia)    4 times   Sleep apnea    wears CPAP sometimes per pt.   05/25/19 - not able to use at this time due to neck pain.   Thyroid  disease    take Synthroid  d/t scar tissue from radiation around thyroid    Ulcer     Past Surgical History:  Procedure Laterality Date   ANTERIOR CERVICAL DECOMP/DISCECTOMY FUSION N/A 05/27/2020   Procedure: Cervical Three-Four Anterior cervical decompression/discectomy/fusion;  Surgeon: Gillie Duncans, MD;  Location: [facility] OR;  Service: Neurosurgery;  Laterality: N/A;  anterior   BACK SURGERY     COLONOSCOPY WITH  PROPOFOL  N/A 01/06/2020   Procedure: COLONOSCOPY WITH PROPOFOL ;  Surgeon: San Sandor GAILS, DO;  Location: [facility];  Service: Gastroenterology;  Laterality: N/A;   ELBOW SURGERY Left    HEMOSTASIS CLIP PLACEMENT  01/06/2020   Procedure: HEMOSTASIS CLIP PLACEMENT;  Surgeon: San Sandor GAILS, DO;  Location: [facility];  Service: Gastroenterology;;   POLYPECTOMY  01/06/2020   Procedure: POLYPECTOMY;  Surgeon: San Sandor GAILS, DO;  Location: [facility];  Service: Gastroenterology;;   SHOULDER SURGERY     TONSILLECTOMY     tracheotomy     WRIST SURGERY     right    Allergies: Tiotropium, Benzyl alcohol-camphor-menthol , Budesonide-formoterol fumarate, Pilocarpine, Poison oak extract, Vancomycin, Bee venom, Hydrocodone , and Vicodin [hydrocodone -acetaminophen ]  Medications: Prior to Admission medications   Medication Sig Start Date End Date Taking? Authorizing Provider  acetaminophen  (TYLENOL ) 500 MG tablet Take 1,000 mg by mouth every 8 (eight) hours as needed for moderate pain.    [provider]  albuterol  (PROVENTIL  HFA;VENTOLIN  HFA) 108 (90 BASE) MCG/ACT inhaler Inhale 2 puffs into the lungs every 6 (six) hours as needed for wheezing or shortness of breath.     [provider]  albuterol  (PROVENTIL ) (2.5 MG/3ML) 0.083% nebulizer solution Take 2.5 mg by nebulization every 6 (six) hours as needed for wheezing or shortness of breath.    [provider]  amLODipine  (NORVASC )  10 MG tablet Take 10 mg by mouth daily.    [provider]  buPROPion  (WELLBUTRIN  SR) 150 MG 12 hr tablet Take 150 mg by mouth 2 (two) times daily.    [provider]  chlorhexidine  (PERIDEX ) 0.12 % solution Use as directed 15 mLs in the mouth or throat 2 (two) times daily.    [provider]  ciprofloxacin-dexamethasone  (CIPRODEX) OTIC suspension Place 4 drops into the left ear 2 (two) times daily.    [provider]  cyclobenzaprine  (FLEXERIL ) 10  MG tablet Take 1 tablet (10 mg total) by mouth 3 (three) times daily as needed for muscle spasms. 05/28/20   Lanis Pupa, MD  fluticasone-salmeterol (ADVAIR) 250-50 MCG/ACT AEPB Inhale 1 puff into the lungs in the morning and at bedtime.    [provider]  gabapentin  (NEURONTIN ) 300 MG capsule Take 300-600 mg by mouth See admin instructions. Take 1 capsule (300 mg) by mouth in the morning, take 1 capsule (300 mg) by mouth at noon & take 3 capsules (900 mg) by mouth at night.    [provider]  hydrOXYzine  (ATARAX /VISTARIL ) 10 MG tablet Take 10 mg by mouth 3 (three) times daily as needed for anxiety.    [provider]  ipratropium (ATROVENT ) 0.02 % nebulizer solution Take 0.5 mg by nebulization 4 (four) times daily as needed for wheezing or shortness of breath.     [provider]  ipratropium (ATROVENT ) 0.06 % nasal spray Place 1 spray into both nostrils 4 (four) times daily.    [provider]  levothyroxine  (SYNTHROID ) 112 MCG tablet Take 112 mcg by mouth daily before breakfast.    [provider]  lisinopril  (PRINIVIL ,ZESTRIL ) 20 MG tablet Take 20 mg by mouth daily.    [provider]  loratadine  (CLARITIN ) 10 MG tablet Take 10 mg by mouth daily.    [provider]  losartan (COZAAR) 50 MG tablet Take 25 mg by mouth daily. 12/05/23   [provider]  Magnesium  Oxide 420 MG TABS Take 210 mg by mouth daily.    [provider]  meloxicam (MOBIC) 7.5 MG tablet Take 7.5 mg by mouth daily as needed for pain.    [provider]  Nutritional Supplements (ENSURE CLEAR) LIQD Take 1 George by mouth in the morning and at bedtime.    [provider]  Nutritional Supplements (ENSURE ORIGINAL) LIQD Take 1 Bottle by mouth in the morning, at noon, and at bedtime.    [provider]  pantoprazole  (PROTONIX ) 40 MG tablet Take 40 mg by mouth in the morning and at bedtime.     [provider]   sodium chloride  (OCEAN) 0.65 % SOLN nasal spray Place 1 spray into both nostrils in the morning, at noon, and at bedtime.    [provider]  SODIUM CHLORIDE  PO Take 125 g by mouth daily.    [provider]  sodium fluoride  (FLUORISHIELD) 1.1 % GEL dental gel Place 1 application onto teeth in the morning and at bedtime.    [provider]  Sodium Fluoride  1.1 % PSTE Place 1 Application onto teeth in the morning and at bedtime.    [provider]  tamsulosin  (FLOMAX ) 0.4 MG CAPS capsule Take 0.8 mg by mouth at bedtime.     [provider]  tiZANidine (ZANAFLEX) 4 MG tablet Take 4 mg by mouth 3 (three) times daily as needed for muscle spasms.    [provider]  Family History  Problem Relation Age of Onset   Cancer Mother        breast   Heart disease Father    Colon cancer Neg Hx    Stomach cancer Neg Hx    Pancreatic cancer Neg Hx    Esophageal cancer Neg Hx    Rectal cancer Neg Hx     Social History   Socioeconomic History   Marital status: Divorced    Spouse name: Not on file   Number of children: Not on file   Years of education: Not on file   Highest education level: Not on file  Occupational History   Occupation: retired    Comment: librarian, academic  Tobacco Use   Smoking status: Former    Current packs/day: 0.00    Average packs/day: 0.5 packs/day for 44.0 years (22.0 ttl pk-yrs)    Types: Cigarettes    Start date: 65    Quit date: 2017    Years since quitting: 8.8   Smokeless tobacco: Never  Vaping Use   Vaping status: Never Used  Substance and Sexual Activity   Alcohol use: No   Drug use: No   Sexual activity: Never  Other Topics Concern   Not on file  Social History Narrative   Not on file   Social Drivers of Health   Financial Resource Strain: Not on file  Food Insecurity: No Food Insecurity (06/14/2020)   [facility] from [facility]   Hunger Vital Sign    Within the past 12  months, you worried that your food would run out before you got the money to buy more.: Never true    Within the past 12 months, the food you bought just didn't last and you didn't have money to get more.: Never true  Transportation Needs: Not on file  Physical Activity: Not on file  Stress: Not on file  Social Connections: Unknown (08/07/2021)   [facility]   Social Network    Social Network: Not on file    Review of Systems: A 12 point ROS discussed and pertinent positives are indicated in the HPI above.  All other systems are negative.  Review of Systems  Constitutional:  Positive for activity change. Negative for fatigue and fever.  HENT:  Positive for sore throat.   Respiratory:  Negative for cough and shortness of breath.   Neurological:  Positive for weakness.  Psychiatric/Behavioral:  Negative for behavioral problems and confusion.     Vital Signs: BP (!) 146/83 (BP Location: Left Arm)   Pulse 81   Temp 98.3 F (36.8 C) (Oral)   Resp 19   Ht 5' 9 (1.753 m)   Wt 128 lb 4.9 oz (58.2 kg)   SpO2 92%   BMI 18.95 kg/m   Advance Care Plan: The advanced care plan/surrogate decision maker was discussed at the time of visit and documented in the medical record.    Physical Exam Vitals reviewed.  HENT:     Mouth/Throat:     Mouth: Mucous membranes are moist.  Cardiovascular:     Rate and Rhythm: Normal rate and regular rhythm.     Heart sounds: No murmur heard. Pulmonary:     Effort: Pulmonary effort is normal.     Breath sounds: Rhonchi present.  Abdominal:     Palpations: Abdomen is soft.  Musculoskeletal:        General: Normal range of motion.  Skin:    General: Skin is warm.  Neurological:     Mental Status: He is alert and oriented to person, place, and time.  Psychiatric:        Behavior: Behavior normal.     Imaging: DG Swallowing Func-Speech Pathology Result Date: 01/30/2024 Table formatting from the original result was not included.  Modified Barium Swallow Study Patient Details Name: George Nielsen MRN: [id number] Date of Birth: 1955-07-02 Today's Date: 01/30/2024 HPI/PMH: HPI: Patient is a 68 y.o. 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 On exam, patient on HFNC 10L with SpO2 >94%, did not follow commands. Food particles noted on oral inspection. He was admitted to ICU and placed on BiPAP. CTH showed Subacute to chronic appearing small vessel ischemic changes within the right frontal parietal periventricular and subcortical white matter but no other signs of acute infarct or hemorrhage. CXR showed minimal bibasilar atelectasis. Patient was repeatedly taking off BiPAP resulting in oxygen saturation dropping to 80's%. RT placed patient on 8L [place]. Patient has been NPO and SLP swallow evaluation ordered. Clinical Impression: Clinical Impression: Patient presents with a severe-profound oropharyngeal dysphagia as per this MBS. Because of his cervical anatomy, his head was in a chin tuck and head turned to left position. Visualization of pharyngeal structures was challenging and clear visualization of epiglottis was not achieved. Patient exhibited very minimal anterior movement of hyoid, incomplete laryngeal vestibule closure, no epiglottic inversion and minimal laryngeal elevation. Penetration to the vocal cords that did not clear (PAS 5) as well as silent aspiration (PAS 8) occured with all tested barium consistencies (thin, nectar thick, honey thick, puree). Wet sounding voice and instances of patient's throat clearing did correlate with barium residuals at approximate level of the vocal cords. Patient unable to effectively clear residuals in laryngeal  vestibule or in pharynx. Retrograde movement of barium observed coming up through the PES and at least one time resulting in penetration and then aspiration. Unfortunately, SLP cannot recommend any safe PO consistency and there are no strategies that patient would be able to perform to reduce aspiration risk. SLP recommends strict NPO status. DIGEST Swallow Severity Rating*  Safety: 4  Efficiency:3  Overall Pharyngeal Swallow Severity: 4 1: mild; 2: moderate; 3: severe; 4: profound *The Dynamic Imaging Grade of Swallowing Toxicity is standardized for the head and neck cancer population, however, demonstrates promising clinical applications across populations to standardize the clinical rating of pharyngeal swallow safety and severity. Factors that may increase risk of adverse event in presence of aspiration Noe & Lianne 2021): Factors that may increase risk of adverse event in presence of aspiration Noe & Lianne 2021): Poor general health and/or compromised immunity; Limited mobility; Frail or deconditioned; Weak cough; Frequent aspiration of large volumes Recommendations/Plan: Swallowing Evaluation Recommendations Swallowing Evaluation Recommendations Recommendations: NPO Medication Administration: Via alternative means Oral care recommendations: Oral care QID (4x/day) Recommended consults: Consider Palliative care Treatment Plan Treatment Plan Treatment recommendations: No treatment recommended at this time Follow-up recommendations: Follow physicians's recommendations for discharge plan and follow up therapies Functional status assessment: Patient has had a recent decline in their functional status and/or demonstrates limited ability to make significant improvements in function in a reasonable and predictable amount of time. Recommendations Recommendations for follow up therapy are one component of a multi-disciplinary discharge planning process,  led by the attending physician.  Recommendations may be  updated based on patient status, additional functional criteria and insurance authorization. Assessment: Orofacial Exam: Orofacial Exam Oral Cavity - Dentition: Adequate natural dentition Orofacial Anatomy: WFL Anatomy: Anatomy: Other (Comment) (see impression statement) Boluses Administered: Boluses Administered Boluses Administered: Thin liquids (Level 0); Mildly thick liquids (Level 2, nectar thick); Moderately thick liquids (Level 3, honey thick); Puree  Oral Impairment Domain: Oral Impairment Domain Lip Closure: No labial escape Tongue control during bolus hold: Not tested Bolus transport/lingual motion: Slow tongue motion Oral residue: Residue collection on oral structures Location of oral residue : Tongue Initiation of pharyngeal swallow : Pyriform sinuses  Pharyngeal Impairment Domain: Pharyngeal Impairment Domain Soft palate elevation: Escape to nasopharynx Laryngeal elevation: Minimal superior movement of thyroid  cartilage with minimal approximation of arytenoids to epiglottic petiole Anterior hyoid excursion: Partial anterior movement Epiglottic movement: No inversion Laryngeal vestibule closure: Incomplete, narrow column air/contrast in laryngeal vestibule Pharyngeal stripping wave : Absent Pharyngeal contraction (A/P view only): N/A Pharyngoesophageal segment opening: Partial distention/partial duration, partial obstruction of flow Tongue base retraction: Narrow column of contrast or air between tongue base and PPW Pharyngeal residue: Collection of residue within or on pharyngeal structures Location of pharyngeal residue: Pyriform sinuses; Aryepiglottic folds; Pharyngeal wall; Tongue base; Diffuse (>3 areas)  Esophageal Impairment Domain: Esophageal Impairment Domain Esophageal clearance upright position: Esophageal retention with retrograde flow through the PES Pill: No data recorded Penetration/Aspiration Scale Score: Penetration/Aspiration Scale Score 5.  Material enters airway, CONTACTS cords and  not ejected out: Mildly thick liquids (Level 2, nectar thick); Thin liquids (Level 0); Moderately thick liquids (Level 3, honey thick); Puree 8.  Material enters airway, passes BELOW cords without attempt by patient to eject out (silent aspiration) : Thin liquids (Level 0); Mildly thick liquids (Level 2, nectar thick); Moderately thick liquids (Level 3, honey thick); Puree Compensatory Strategies: Compensatory Strategies Compensatory strategies: No   General Information: No data recorded Diet Prior to this Study: NPO   Temperature : Normal   No data recorded  Supplemental O2: High flow nasal cannula   History of Recent Intubation: No  Behavior/Cognition: Alert; Cooperative; Pleasant mood Self-Feeding Abilities: Able to self-feed Baseline vocal quality/speech: Dysphonic Volitional Cough: Able to elicit Volitional Swallow: Able to elicit Exam Limitations: Poor positioning; Limited visibility Goal Planning: Prognosis for improved oropharyngeal function: Guarded No data recorded No data recorded Patient/Family Stated Goal: patient agreeable to MBS but wants to hold off as his throat is sore and he thinks it would be a waste of time Consulted and agree with results and recommendations: Pt unable/family or caregiver not available Pain: Pain Assessment Pain Assessment: No/denies pain Pain Score: 0 Faces Pain Scale: 2 Facial Expression: 0 Body Movements: 0 Muscle Tension: 0 Compliance with ventilator (intubated pts.): N/A Vocalization (extubated pts.): 0 CPOT Total: 0 Pain Location: throat Pain Descriptors / Indicators: Discomfort Pain Intervention(s): Monitored during session End of Session: Start Time:SLP Start Time (ACUTE ONLY): 1434 Stop Time: SLP Stop Time (ACUTE ONLY): 1445 Time Calculation:SLP Time Calculation (min) (ACUTE ONLY): 11 min Charges: SLP Evaluations $ SLP Speech Visit: 1 Visit SLP Evaluations $BSS Swallow: 1 Procedure $MBS Swallow: 1 Procedure SLP visit diagnosis: SLP Visit Diagnosis: Dysphagia,  oropharyngeal phase (R13.12) Past Medical History: Past Medical History: Diagnosis Date  Allergy   Anemia   Anxiety   Arthritis   Cancer (HCC)   tongue and neck lymph nodes  COPD (chronic obstructive pulmonary disease) (HCC)   Dyspnea   climbing stairs, walking distance  Emphysema of lung (HCC)   GERD (gastroesophageal reflux disease)   Hepatitis C   treated  History of radiation therapy 11/10/14- 12/29/14  BOT and bilateral neck/ 70 Gy in 35 fractions to gross disease, 63 Gy in 35 fractions to high risk nodal echelons, and 56 Gy in 35 fraction to intermediate risk nodal echelons.   Hypertension   Hypothyroidism   PNA (pneumonia)   4 times  Sleep apnea   wears CPAP sometimes per pt.   05/25/19 - not able to use at this time due to neck pain.  Thyroid  disease   take Synthroid  d/t scar tissue from radiation around thyroid   Ulcer  Past Surgical History: Past Surgical History: Procedure Laterality Date  ANTERIOR CERVICAL DECOMP/DISCECTOMY FUSION N/A 05/27/2020  Procedure: Cervical Three-Four Anterior cervical decompression/discectomy/fusion;  Surgeon: Gillie Duncans, MD;  Location: [facility] OR;  Service: Neurosurgery;  Laterality: N/A;  anterior  BACK SURGERY    COLONOSCOPY WITH PROPOFOL  N/A 01/06/2020  Procedure: COLONOSCOPY WITH PROPOFOL ;  Surgeon: San Sandor GAILS, DO;  Location: [facility];  Service: Gastroenterology;  Laterality: N/A;  ELBOW SURGERY Left   HEMOSTASIS CLIP PLACEMENT  01/06/2020  Procedure: HEMOSTASIS CLIP PLACEMENT;  Surgeon: San Sandor GAILS, DO;  Location: [facility];  Service: Gastroenterology;;  POLYPECTOMY  01/06/2020  Procedure: POLYPECTOMY;  Surgeon: San Sandor GAILS, DO;  Location: [facility];  Service: Gastroenterology;;  SHOULDER SURGERY    TONSILLECTOMY    tracheotomy    WRIST SURGERY    right Norleen IVAR Blase, MA, CCC-SLP Speech Therapy   DG CHEST PORT 1 VIEW Result Date: 01/30/2024 EXAM: 1 VIEW(S) XRAY OF THE CHEST 01/30/2024 08:41:14 AM COMPARISON: 01/29/2024 CLINICAL HISTORY:  Aspiration into airway FINDINGS: LUNGS AND PLEURA: Bilateral pleural effusion, small. Scattered bibasilar platelike opacities. Emphysematous changes. No pulmonary edema. No pneumothorax. HEART AND MEDIASTINUM: Atherosclerotic calcifications. BONES AND SOFT TISSUES: No acute osseous abnormality. IMPRESSION: 1. Bilateral pleural effusions, small. 2. Scattered bibasilar platelike opacities, likely atelectasis or scarring. Electronically signed by: Waddell Calk MD 01/30/2024 02:17 PM EST RP Workstation: [id number]   CT Head Wo Contrast Result Date: 01/29/2024 CLINICAL DATA:  Altered level of consciousness EXAM: CT HEAD WITHOUT CONTRAST TECHNIQUE: Contiguous axial images were obtained from the base of the skull through the vertex without intravenous contrast. RADIATION DOSE REDUCTION: This exam was performed according to the departmental dose-optimization program which includes automated exposure control, adjustment of the mA and/or kV according to patient size and/or use of iterative reconstruction technique. COMPARISON:  11/09/2023 FINDINGS: Brain: Since the previous exam, hypodensities have developed within the right frontal parietal periventricular and subcortical white matter, consistent with prior infarct. No evidence of acute infarct or hemorrhage. The lateral ventricles and midline structures are unremarkable. No acute extra-axial fluid collections. No mass effect. Vascular: No hyperdense vessel or unexpected calcification. Skull: Normal. Negative for fracture or focal lesion. Sinuses/Orbits: No acute finding. Other: None. IMPRESSION: 1. Subacute to chronic appearing small vessel ischemic changes within the right frontal parietal periventricular and subcortical white matter, new since prior exam. 2. No other signs of acute infarct or hemorrhage. Electronically Signed   By: Ozell Daring M.D.   On: 01/29/2024 14:59   DG Chest Portable 1 View Result Date: 01/29/2024 EXAM: 1 VIEW XRAY OF THE CHEST 01/29/2024  01:48:00 PM COMPARISON: Previous exams are available. CLINICAL HISTORY: ams, hypotension, hypoxia, cough FINDINGS: LUNGS AND PLEURA: Minimal bibasilar atelectasis is noted. No focal pulmonary opacity. No pulmonary edema. No pleural effusion. No pneumothorax. HEART AND MEDIASTINUM: No acute abnormality of  the cardiac and mediastinal silhouettes. BONES AND SOFT TISSUES: No acute osseous abnormality. IMPRESSION: 1. Minimal bibasilar atelectasis. Electronically signed by: Lynwood Seip MD 01/29/2024 02:09 PM EST RP Workstation: [id number]    Labs:  CBC: Recent Labs    01/29/24 1609 01/29/24 1947 01/30/24 0330 01/31/24 0408 02/01/24 0853  WBC 9.0  --  6.6 6.9 7.4  HGB 12.7* 11.9* 11.4* 12.2* 13.5  HCT 39.1 35.0* 35.2* 36.8* 40.4  PLT 172  --  175 187 196    COAGS: Recent Labs    01/29/24 1245  INR 0.9    BMP: Recent Labs    01/29/24 1245 01/29/24 1252 01/29/24 1253 01/29/24 1300 01/29/24 1609 01/29/24 1947 01/30/24 0330 01/31/24 0408 02/01/24 0853  NA 140   < > 140   < >  --  141 141 140 139  K 4.2   < > 4.1   < >  --  3.9 3.6 3.8 3.7  CL 101  --  101  --   --   --  104 101 95*  CO2 28  --   --   --   --   --  30 29 30   GLUCOSE 112*  --  108*  --   --   --  97 103* 88  BUN 27*  --  29*  --   --   --  21 12 9   CALCIUM 8.1*  --   --   --   --   --  8.0* 8.7* 9.0  CREATININE 1.15  --  1.20  --  1.06  --  0.98 0.81 0.63  GFRNONAA >60  --   --   --  >60  --  >60 >60 >60   < > = values in this interval not displayed.    LIVER FUNCTION TESTS: Recent Labs    01/29/24 1245 02/01/24 0853  BILITOT 0.5 0.4  AST 17 15  ALT 14 13  ALKPHOS 52 52  PROT 6.2* 6.0*  ALBUMIN 3.1* 2.8*    TUMOR MARKERS: No results for input(s): AFPTM, CEA, CA199, CHROMGRNA in the last 8760 hours.  Assessment and Plan:  Scheduled for percutaneous gastric tube placement Planned for 11/11 in IR Risks and benefits image guided gastrostomy tube placement was discussed with the patient  including, but not limited to the need for a barium enema during the procedure, bleeding, infection, peritonitis and/or damage to adjacent structures.  All of the patient's questions were answered, patient is agreeable to proceed.  Consent signed and in chart.    Thank you for this interesting consult.  I greatly enjoyed meeting George Nielsen and look forward to participating in their care.  A copy of this report was sent to the requesting provider on this date.  Electronically Signed: Sharlet DELENA Candle, PA-C 02/03/2024, 12:58 PM   I spent a total of 40 Minutes    in face to face in clinical consultation, greater than 50% of which was counseling/coordinating care for percutaneous gastric tube placement

## 2024-02-04 ENCOUNTER — Inpatient Hospital Stay (HOSPITAL_COMMUNITY)

## 2024-02-04 DIAGNOSIS — G4733 Obstructive sleep apnea (adult) (pediatric): Secondary | ICD-10-CM

## 2024-02-04 DIAGNOSIS — R1310 Dysphagia, unspecified: Secondary | ICD-10-CM

## 2024-02-04 DIAGNOSIS — Z01811 Encounter for preprocedural respiratory examination: Secondary | ICD-10-CM

## 2024-02-04 DIAGNOSIS — J449 Chronic obstructive pulmonary disease, unspecified: Secondary | ICD-10-CM

## 2024-02-04 DIAGNOSIS — J9621 Acute and chronic respiratory failure with hypoxia: Secondary | ICD-10-CM

## 2024-02-04 HISTORY — PX: IR GASTROSTOMY TUBE MOD SED: IMG625

## 2024-02-04 LAB — GLUCOSE, CAPILLARY
Glucose-Capillary: 59 mg/dL — ABNORMAL LOW (ref 70–99)
Glucose-Capillary: 68 mg/dL — ABNORMAL LOW (ref 70–99)
Glucose-Capillary: 73 mg/dL (ref 70–99)
Glucose-Capillary: 75 mg/dL (ref 70–99)
Glucose-Capillary: 79 mg/dL (ref 70–99)
Glucose-Capillary: 87 mg/dL (ref 70–99)

## 2024-02-04 MED ORDER — LIDOCAINE HCL 1 % IJ SOLN
INTRAMUSCULAR | Status: AC
  Filled 2024-02-04: qty 20

## 2024-02-04 MED ORDER — DEXTROSE-SODIUM CHLORIDE 5-0.45 % IV SOLN: INTRAVENOUS | Status: DC

## 2024-02-04 MED ORDER — GLUCAGON HCL RDNA (DIAGNOSTIC) 1 MG IJ SOLR
INTRAMUSCULAR | Status: AC
  Filled 2024-02-04: qty 1

## 2024-02-04 MED ORDER — LIDOCAINE HCL 1 % IJ SOLN
20.0000 mL | Freq: Once | INTRAMUSCULAR | Status: DC
Start: 2024-02-04 — End: 2024-02-07

## 2024-02-04 MED ORDER — FENTANYL CITRATE (PF) 100 MCG/2ML IJ SOLN
INTRAMUSCULAR | Status: AC
  Filled 2024-02-04: qty 2

## 2024-02-04 MED ORDER — IOHEXOL 300 MG/ML  SOLN: 50.0000 mL | Freq: Once | INTRAMUSCULAR | Status: DC | PRN

## 2024-02-04 MED ORDER — MIDAZOLAM HCL 2 MG/2ML IJ SOLN
INTRAMUSCULAR | Status: AC
  Filled 2024-02-04: qty 2

## 2024-02-04 MED ORDER — BUDESONIDE 0.5 MG/2ML IN SUSP
0.5000 mg | Freq: Two times a day (BID) | RESPIRATORY_TRACT | Status: DC
  Administered 2024-02-04 – 2024-02-06 (×4): 0.5 mg via RESPIRATORY_TRACT
  Filled 2024-02-04 (×5): qty 2

## 2024-02-04 MED ORDER — ARFORMOTEROL TARTRATE 15 MCG/2ML IN NEBU
15.0000 ug | INHALATION_SOLUTION | Freq: Two times a day (BID) | RESPIRATORY_TRACT | Status: DC
  Administered 2024-02-04 – 2024-02-06 (×4): 15 ug via RESPIRATORY_TRACT
  Filled 2024-02-04 (×5): qty 2

## 2024-02-04 MED ORDER — MIDAZOLAM HCL (PF) 2 MG/2ML IJ SOLN
INTRAMUSCULAR | Status: AC | PRN
  Administered 2024-02-04: 1 mg via INTRAVENOUS

## 2024-02-04 MED ORDER — REVEFENACIN 175 MCG/3ML IN SOLN
175.0000 ug | Freq: Every day | RESPIRATORY_TRACT | Status: DC
  Administered 2024-02-05: 175 ug via RESPIRATORY_TRACT
  Filled 2024-02-04 (×2): qty 3

## 2024-02-04 MED ORDER — GLUCAGON HCL RDNA (DIAGNOSTIC) 1 MG IJ SOLR
INTRAMUSCULAR | Status: AC | PRN
  Administered 2024-02-04: .5 mg via INTRAVENOUS

## 2024-02-04 MED ORDER — CEFAZOLIN SODIUM-DEXTROSE 2-4 GM/100ML-% IV SOLN
INTRAVENOUS | Status: AC
  Filled 2024-02-04: qty 100

## 2024-02-04 NOTE — Consult Note (Signed)
 Consult Note  George Nielsen 09-27-1955  [id number].    Requesting MD: Sabas Brod, MD Chief Complaint/Reason for Consult: consult for gastrostomy placement  HPI:  George Nielsen is a 68 y/o M with PMH including, but not limited to, tongue base carcinoma s/p chemo/radiation (2016), COPD, previous trach/PEG due to above, chronic pain, GERD, and protein calorie malnutrition who was admitted 01/29/24 for acute on chronic respiratory failure (respiratory panel negative). Speech therapy was consulted and diagnosed the patient with dysphagia, recommending NPO. Patient refused cortrak placement. IR was consulted and attempted percutaneous gastrostomy tube on 11/11 but had to abort the procedure due narrow window for tube placement due to surrounding colon. General surgery is consulted for consideration of surgical gastrostomy tube. He is not currently on any blood thinners. He has a history of PEG tube but denies a history of abdominal surgery.  ROS: Negative other than HPI  Family History  Problem Relation Age of Onset   Cancer Mother        breast   Heart disease Father    Colon cancer Neg Hx    Stomach cancer Neg Hx    Pancreatic cancer Neg Hx    Esophageal cancer Neg Hx    Rectal cancer Neg Hx     Past Medical History:  Diagnosis Date   Allergy    Anemia    Anxiety    Arthritis    Cancer (HCC)    tongue and neck lymph nodes   COPD (chronic obstructive pulmonary disease) (HCC)    Dyspnea    climbing stairs, walking distance   Emphysema of lung (HCC)    GERD (gastroesophageal reflux disease)    Hepatitis C    treated   History of radiation therapy 11/10/14- 12/29/14   BOT and bilateral neck/ 70 Gy in 35 fractions to gross disease, 63 Gy in 35 fractions to high risk nodal echelons, and 56 Gy in 35 fraction to intermediate risk nodal echelons.    Hypertension    Hypothyroidism    PNA (pneumonia)    4 times   Sleep apnea    wears CPAP sometimes per pt.   05/25/19 -  not able to use at this time due to neck pain.   Thyroid  disease    take Synthroid  d/t scar tissue from radiation around thyroid    Ulcer     Past Surgical History:  Procedure Laterality Date   ANTERIOR CERVICAL DECOMP/DISCECTOMY FUSION N/A 05/27/2020   Procedure: Cervical Three-Four Anterior cervical decompression/discectomy/fusion;  Surgeon: Gillie Duncans, MD;  Location: [facility] OR;  Service: Neurosurgery;  Laterality: N/A;  anterior   BACK SURGERY     COLONOSCOPY WITH PROPOFOL  N/A 01/06/2020   Procedure: COLONOSCOPY WITH PROPOFOL ;  Surgeon: San Sandor GAILS, DO;  Location: [facility];  Service: Gastroenterology;  Laterality: N/A;   ELBOW SURGERY Left    HEMOSTASIS CLIP PLACEMENT  01/06/2020   Procedure: HEMOSTASIS CLIP PLACEMENT;  Surgeon: San Sandor GAILS, DO;  Location: [facility];  Service: Gastroenterology;;   IR GASTROSTOMY TUBE MOD SED  02/04/2024   POLYPECTOMY  01/06/2020   Procedure: POLYPECTOMY;  Surgeon: San Sandor GAILS, DO;  Location: [facility];  Service: Gastroenterology;;   SHOULDER SURGERY     TONSILLECTOMY     tracheotomy     WRIST SURGERY     right    Social History:  reports that he quit smoking about 8 years ago. His smoking use included cigarettes. He started smoking about 52 years ago.  He has a 22 pack-year smoking history. He has never used smokeless tobacco. He reports that he does not drink alcohol and does not use drugs.  Allergies:  Allergies  Allergen Reactions   Tiotropium Anaphylaxis and Anxiety   Benzyl Alcohol-Camphor-Menthol  Dermatitis   Budesonide-Formoterol Fumarate Other (See Comments)    Unknown    Pilocarpine Other (See Comments)    Sweating, Nasal discharge   Poison Oak Extract Dermatitis   Vancomycin Other (See Comments)    Unknown    Bee Venom Swelling   Hydrocodone  Other (See Comments)    Unknown    Vicodin [Hydrocodone -Acetaminophen ] Nausea And Vomiting    Medications Prior to Admission  Medication Sig Dispense Refill    acetaminophen  (TYLENOL ) 500 MG tablet Take 1,000 mg by mouth every 8 (eight) hours as needed for moderate pain.     albuterol  (PROVENTIL  HFA;VENTOLIN  HFA) 108 (90 BASE) MCG/ACT inhaler Inhale 2 puffs into the lungs every 6 (six) hours as needed for wheezing or shortness of breath.      albuterol  (PROVENTIL ) (2.5 MG/3ML) 0.083% nebulizer solution Take 2.5 mg by nebulization every 6 (six) hours as needed for wheezing or shortness of breath.     amLODipine  (NORVASC ) 10 MG tablet Take 10 mg by mouth daily.     buPROPion  (WELLBUTRIN  SR) 150 MG 12 hr tablet Take 150 mg by mouth 2 (two) times daily.     chlorhexidine  (PERIDEX ) 0.12 % solution Use as directed 15 mLs in the mouth or throat 2 (two) times daily.     ciprofloxacin-dexamethasone  (CIPRODEX) OTIC suspension Place 4 drops into the left ear 2 (two) times daily.     cyclobenzaprine  (FLEXERIL ) 10 MG tablet Take 1 tablet (10 mg total) by mouth 3 (three) times daily as needed for muscle spasms. 30 tablet 0   fluticasone-salmeterol (ADVAIR) 250-50 MCG/ACT AEPB Inhale 1 puff into the lungs in the morning and at bedtime.     gabapentin  (NEURONTIN ) 300 MG capsule Take 300-600 mg by mouth See admin instructions. Take 1 capsule (300 mg) by mouth in the morning, take 1 capsule (300 mg) by mouth at noon & take 3 capsules (900 mg) by mouth at night.     hydrOXYzine  (ATARAX /VISTARIL ) 10 MG tablet Take 10 mg by mouth 3 (three) times daily as needed for anxiety.     ipratropium (ATROVENT ) 0.02 % nebulizer solution Take 0.5 mg by nebulization 4 (four) times daily as needed for wheezing or shortness of breath.      ipratropium (ATROVENT ) 0.06 % nasal spray Place 1 spray into both nostrils 4 (four) times daily.     levothyroxine  (SYNTHROID ) 112 MCG tablet Take 112 mcg by mouth daily before breakfast.     lisinopril  (PRINIVIL ,ZESTRIL ) 20 MG tablet Take 20 mg by mouth daily.     loratadine  (CLARITIN ) 10 MG tablet Take 10 mg by mouth daily.     losartan (COZAAR) 50 MG  tablet Take 25 mg by mouth daily.     Magnesium  Oxide 420 MG TABS Take 210 mg by mouth daily.     meloxicam (MOBIC) 7.5 MG tablet Take 7.5 mg by mouth daily as needed for pain.     Nutritional Supplements (ENSURE CLEAR) LIQD Take 1 Can by mouth in the morning and at bedtime.     Nutritional Supplements (ENSURE ORIGINAL) LIQD Take 1 Bottle by mouth in the morning, at noon, and at bedtime.     pantoprazole  (PROTONIX ) 40 MG tablet Take 40 mg by mouth in the morning  and at bedtime.      sodium chloride  (OCEAN) 0.65 % SOLN nasal spray Place 1 spray into both nostrils in the morning, at noon, and at bedtime.     SODIUM CHLORIDE  PO Take 125 g by mouth daily.     sodium fluoride  (FLUORISHIELD) 1.1 % GEL dental gel Place 1 application onto teeth in the morning and at bedtime.     Sodium Fluoride  1.1 % PSTE Place 1 Application onto teeth in the morning and at bedtime.     tamsulosin  (FLOMAX ) 0.4 MG CAPS capsule Take 0.8 mg by mouth at bedtime.      tiZANidine (ZANAFLEX) 4 MG tablet Take 4 mg by mouth 3 (three) times daily as needed for muscle spasms.      Blood pressure 129/61, pulse (!) 58, temperature 98.6 F (37 C), temperature source Oral, resp. rate 14, height 5' 9 (1.753 m), weight 58.2 kg, SpO2 100%. Physical Exam:  General: pleasant, WD, cachectic male who is laying in bed in NAD HEENT: head is normocephalic, atraumatic.  Sclera are noninjected.  Pupils equal and round.  Mouth is pink and dry Heart: regular, rate, and rhythm. Palpable radial and pedal pulses bilaterally Lungs: Respiratory effort nonlabored on 4L via nasal cannula  Abd: soft, NT, ND, prior PEG site in LUQ, no other surgical scars MS: all 4 extremities are symmetrical with no cyanosis, clubbing, or edema. Skin: warm and dry with no masses, lesions, or rashes Neuro: non focal exam  Psych: A&Ox3 with an appropriate affect.   Results for orders placed or performed during the hospital encounter of 01/29/24 (from the past 48  hours)  Glucose, capillary     Status: Abnormal   Collection Time: 02/02/24 11:48 AM  Result Value Ref Range   Glucose-Capillary 106 (H) 70 - 99 mg/dL    Comment: Glucose reference range applies only to samples taken after fasting for at least 8 hours.   Comment 1 Notify RN    Comment 2 Document in Chart   Glucose, capillary     Status: Abnormal   Collection Time: 02/02/24  3:14 PM  Result Value Ref Range   Glucose-Capillary 129 (H) 70 - 99 mg/dL    Comment: Glucose reference range applies only to samples taken after fasting for at least 8 hours.  Glucose, capillary     Status: None   Collection Time: 02/02/24 10:10 PM  Result Value Ref Range   Glucose-Capillary 94 70 - 99 mg/dL    Comment: Glucose reference range applies only to samples taken after fasting for at least 8 hours.  Glucose, capillary     Status: None   Collection Time: 02/03/24 12:49 AM  Result Value Ref Range   Glucose-Capillary 95 70 - 99 mg/dL    Comment: Glucose reference range applies only to samples taken after fasting for at least 8 hours.  Glucose, capillary     Status: None   Collection Time: 02/03/24  4:50 AM  Result Value Ref Range   Glucose-Capillary 77 70 - 99 mg/dL    Comment: Glucose reference range applies only to samples taken after fasting for at least 8 hours.  Glucose, capillary     Status: None   Collection Time: 02/03/24  7:45 AM  Result Value Ref Range   Glucose-Capillary 90 70 - 99 mg/dL    Comment: Glucose reference range applies only to samples taken after fasting for at least 8 hours.  Glucose, capillary     Status: None  Collection Time: 02/03/24 11:13 AM  Result Value Ref Range   Glucose-Capillary 87 70 - 99 mg/dL    Comment: Glucose reference range applies only to samples taken after fasting for at least 8 hours.  Glucose, capillary     Status: None   Collection Time: 02/03/24  4:18 PM  Result Value Ref Range   Glucose-Capillary 86 70 - 99 mg/dL    Comment: Glucose reference  range applies only to samples taken after fasting for at least 8 hours.  Glucose, capillary     Status: None   Collection Time: 02/03/24  8:17 PM  Result Value Ref Range   Glucose-Capillary 82 70 - 99 mg/dL    Comment: Glucose reference range applies only to samples taken after fasting for at least 8 hours.   Comment 1 Notify RN    Comment 2 Document in Chart   Glucose, capillary     Status: None   Collection Time: 02/04/24 12:13 AM  Result Value Ref Range   Glucose-Capillary 73 70 - 99 mg/dL    Comment: Glucose reference range applies only to samples taken after fasting for at least 8 hours.   Comment 1 Notify RN    Comment 2 Document in Chart   Glucose, capillary     Status: None   Collection Time: 02/04/24  4:27 AM  Result Value Ref Range   Glucose-Capillary 75 70 - 99 mg/dL    Comment: Glucose reference range applies only to samples taken after fasting for at least 8 hours.   Comment 1 Notify RN    Comment 2 Document in Chart   Glucose, capillary     Status: None   Collection Time: 02/04/24  8:41 AM  Result Value Ref Range   Glucose-Capillary 87 70 - 99 mg/dL    Comment: Glucose reference range applies only to samples taken after fasting for at least 8 hours.   IR GASTROSTOMY TUBE MOD SED Result Date: 02/04/2024 CLINICAL DATA:  History of prior carcinoma of the tongue and prior gastrostomy tube placement. Respiratory failure and malnutrition. Need for supplemental nutrition. EXAM: ABORTED ATTEMPTED PERCUTANEOUS GASTROSTOMY TUBE PLACEMENT ANESTHESIA/SEDATION: The patient received 1 mg IV Versed . CONTRAST:  None MEDICATIONS: 0.5 mg IV glucagon FLUOROSCOPY: Radiation Exposure Index: 5.0 mGy Kerma. PROCEDURE: The procedure, risks, benefits, and alternatives were explained to the patient. Questions regarding the procedure were encouraged and answered. The patient understands and consents to the procedure. A timeout was performed prior to initiating the procedure. Initial fluoroscopy was  performed of the abdomen. A 5-French catheter was then advanced through the patient's mouth under fluoroscopy into the esophagus and to the level of the stomach. This catheter was used to insufflate the stomach with air under fluoroscopy. A hemostat was placed over the old gastrostomy tube scar at the skin. As additional areas insufflation was performed of the stomach, multiple fluoroscopic images were obtained in different projections to evaluate the stomach and its relationship to adjacent colon. The procedure was aborted without skin incision or attempted puncture of the stomach. COMPLICATIONS: None. FINDINGS: Initial fluoroscopy demonstrates gaseous distension of the transverse colon, splenic flexure and descending colon. After insertion of a 5 French catheter into the stomach, insufflation demonstrates interposition of the distal transverse colon and splenic flexure between much of the stomach and the anterior gastric wall. The old gastrostomy tube site is inferior to the distal transverse colon and just medial to the splenic flexure. A safe percutaneous window was not present for gastric access without  risk of colonic injury. The procedure was therefore aborted. IMPRESSION: Aborted gastrostomy tube placement attempt due to interposition of the colon between the stomach and abdominal wall. Minimal window was present inferior to the distal transverse colon and medial to the splenic flexure for gastric access at the site of prior gastrostomy tube placement. The procedure was aborted as a safe percutaneous window was not present for gastric access without risk of colonic injury. Electronically Signed   By: Marcey Moan M.D.   On: 02/04/2024 10:33   DG Abd 1 View Result Date: 02/03/2024 EXAM: 1 VIEW XRAY OF THE ABDOMEN 02/03/2024 02:25:00 PM COMPARISON: CT 01/31/2024. CLINICAL HISTORY: Weight loss. FINDINGS: BOWEL: Nonobstructive bowel gas pattern. Enteric contrast in the distal colon. SOFT TISSUES: Surgical  clip in mid abdomen. No opaque urinary calculi. BONES: Spinal fusion hardware spanning L4 through S1. Degenerative changes of the lumbar spine. No acute osseous abnormality. IMPRESSION: 1. No acute abdominal findings. Electronically signed by: Norman Gatlin MD 02/03/2024 07:21 PM EST RP Workstation: [id number]      Assessment/Plan Chronic dysphagia with severe protein calorie malnutrition  - hx of tongue base carcinoma s/p chemo/radiation in 2016, hx of prior trach/PEG, hx of prior ACDF in 2022 - SLP evaluated 11/5 and recommended strict NPO, pt has been followed for dysphagia as an outpatient as well  - IR attempted gastrostomy placement today but were unsuccessful - due to persistent colonic interposition and concern for risk of colonic injury - pulmonology seeing to weigh in on pulmonary risk with general anesthesia and intubation for surgery  - strongly recommend palliative consult as well to establish GOC - I discussed with them today that gastrostomy will not improve swallowing function, simply provides alternative means of providing enteral nutrition  - general surgery will follow peripherally for now - await further discussions prior to planning any surgical intervention   FEN: NPO, refusing cortrak currently  VTE: LMWH ID: [id number]  given this AM  - per TRH -  Acute on chronic hypoxic respiratory failure - currently on 5L supplemental O2 HTN Hypothyroidism GERD Chronic pain Anemia of chronic disease  Hx of Hep C s/p treatment   I reviewed Consultant IR notes, hospitalist notes, last 24 h vitals and pain scores, last 48 h intake and output, last 24 h labs and trends, and last 24 h imaging results.  This care required high  level of medical decision making.   Burnard JONELLE Louder, [facility] Surgery 02/04/2024, 11:06 AM Please see Amion for pager number during day hours 7:00am-4:30pm  Notes say awaiting PEG.  That is incorrect. He is unable to get an IR tube and have  had partners who do PEGs review and this is not option either.  So this leaves an open gastrostomy tube or lap g tube. Both of which will require general anesthesia with intubation and paralysis.  I am not sure he is well enough to do this right now without increased risk.  Also appears to be difficult intubation given history as well.  I am not planning on proceeding at this time and agree with palliative consult as well as cortrak feeding.

## 2024-02-04 NOTE — Progress Notes (Signed)
 Nutrition Follow-up  DOCUMENTATION CODES:  Severe malnutrition in context of chronic illness  INTERVENTION:  Pt would benefit from cortrak placement and initiation of enteral feeds, at this time pt declines. Pt without nutrition x 6 days this admission, discussed that pt needs initiation of TPN with MD. MD to think it over. Monitor for PEG placement. When in place recommend the following: Osmolite 1.5 (or equivalent) at 55 ml/h (1320 ml per day) Start at 25 and advance by 10mL every 12 hours to reach goal Prosource TF20 60 ml 1x/d Free water: 125mL every 4 hours Provides 2060 kcal, 103 gm protein, 1006 ml free water daily ( = TF+flush) If desired, pt could be initiated on bolus feeds to facilitate a quicker discharge. If this is desired, would recommend the following: Osmolite 1.5 (or equivalent), 1 carton 6x/d Administer 1/2 carton of formula on day 1 and increase to 1 full carton on day 2 to monitor for signs of refeeding and also allow time for tolerance Flush with 60mL of free water before and after each bolus ( , 6x/d) This provides 2130kcal, 89g protein, and ( = TF+flush) Pt is at risk for refeeding syndrome given severe malnutrition and poor PO intake. Monitor magnesium  and phosphorus daily x 3 days, MD to replete as needed. 100mg  thiamine x 5 days  NUTRITION DIAGNOSIS:  Severe Malnutrition related to chronic illness (hx cancer) as evidenced by severe muscle depletion, severe fat depletion. - remains applicable  GOAL:  Patient will meet greater than or equal to 90% of their needs - progressing  MONITOR:  I & O's, Skin, Labs, TF tolerance, Weight trends  REASON FOR ASSESSMENT:  New TF (cortrak list)    ASSESSMENT:  Pt with hx of stage IV tongue base carcinoma s/p radiation/chemo (2016), prior trach and PEG, cervical dystonia, GERD, COPD (emphysema), HTN, hypothyroidism, and hepatitis C presented to ED after becoming unresponsive while eating  11/5  - presented to ED, food particles suctioned out of airway 11/6 - SLP BSE and MBS, strict NPO recommended 11/7 - pt declined cortrak placement 11/11 - scheduled for PEG placement in IR, unable to complete due to anatomy.  Pt scheduled to undergo PEG placement in IR this AM, but unable to identify a safe window fro placement. Surgery consult is now pending for placement. Also has PMT consult now to address goals of care.   Discussed nutrition with MD. Dwan that pt with severe malnutrition on admission and now has been without intake x 6 days due to his refusal of the cortrak tube. Recommended initiation of TPN until enteral access can be achieved. MD to think it over.   Admit weight: 60.2 kg   Current weight: 58.2 kg    Intake/Output Summary (Last 24 hours) at 02/04/2024 1339 Last data filed at 02/04/2024 0400 Gross per 24 hour  Intake 0 ml  Output 900 ml  Net -900 ml  Net IO Since Admission: -4,562.44 mL [02/04/24 1339]  Drains/Lines: UOP x 24 hours  Nutritionally Relevant Medications: Scheduled Meds:  pantoprazole  IV  40 mg Intravenous Q12H   Continuous Infusions:   ceFAZolin  (ANCEF ) IV     PRN Meds: ondansetron , polyethylene glycol  Labs Reviewed:  CBG ranges from 68-95 mg/dL over the last 24 hours  NUTRITION - FOCUSED PHYSICAL EXAM: Flowsheet Row Most Recent Value  Orbital Region Severe depletion  Upper Arm Region Severe depletion  Thoracic and Lumbar Region Severe depletion  Buccal Region Severe depletion  Temple Region Severe depletion  Clavicle  Bone Region Severe depletion  Clavicle and Acromion Bone Region Severe depletion  Scapular Bone Region Severe depletion  Dorsal Hand Severe depletion  Patellar Region Severe depletion  Anterior Thigh Region Severe depletion  Posterior Calf Region Severe depletion  Edema (RD Assessment) None  Hair Reviewed  Eyes Reviewed  Mouth Reviewed  Skin Reviewed  Nails Reviewed    Diet Order:   Diet Order              Diet NPO time specified Except for: Sips with Meds  Diet effective midnight                   EDUCATION NEEDS:  Education needs have been addressed  Skin:  Skin Assessment: Reviewed RN Assessment  Last BM:  11/6  Height:  Ht Readings from Last 1 Encounters:  01/30/24 5' 9 (1.753 m)    Weight:  Wt Readings from Last 1 Encounters:  02/03/24 58.2 kg    Ideal Body Weight:  72.7 kg  BMI:  Body mass index is 18.95 kg/m.  Estimated Nutritional Needs:  Kcal:  1900-2100 kcal/d Protein:  95-110g/d Fluid:  >/=2L/d    Vernell Lukes, RD, LDN, CNSC Registered Dietitian II Please reach out via secure chat

## 2024-02-04 NOTE — Plan of Care (Signed)
  Problem: Education: Goal: Knowledge of General Education information will improve Description: Including pain rating scale, medication(s)/side effects and non-pharmacologic comfort measures Outcome: Progressing   Problem: Health Behavior/Discharge Planning: Goal: Ability to manage health-related needs will improve Outcome: Progressing   Problem: Clinical Measurements: Goal: Respiratory complications will improve Outcome: Progressing   Problem: Activity: Goal: Risk for activity intolerance will decrease Outcome: Progressing    Problem: Coping: Goal: Level of anxiety will decrease Outcome: Progressing   Problem: Elimination: Goal: Will not experience complications related to bowel motility Outcome: Progressing Goal: Will not experience complications related to urinary retention Outcome: Progressing   Problem: Safety: Goal: Ability to remain free from injury will improve Outcome: Progressing   Problem: Skin Integrity: Goal: Risk for impaired skin integrity will decrease Outcome: Progressing

## 2024-02-04 NOTE — TOC Progression Note (Signed)
 Transition of Care [facility]) - Progression Note    Patient Details  Name: George Nielsen MRN: [id number] Date of Birth: October 27, 1955  [facility] CM/SW Contact  Landry DELENA Senters, RN Phone Number: 02/04/2024, 1:00 PM  Clinical Narrative:     Unable to get PEG per IR, waiting on surgery consult. Attempting to arrange enteral feedings at home through [facility]. However, today being Veteran's Day, the [facility] is closed. CM will connect with them in the AM. Patient also willing to see how home abx are covered under his HTA. [facility] aware and will update on his copays.   Form sent to [facility] for [facility] and [facility] services. [facility] has accepted for [facility] services, info on the AVS. [facility] after [facility].   CM will continue to follow.   Expected Discharge Plan: Home w Home Health Services Barriers to Discharge: Continued Medical Work up               Expected Discharge Plan and Services       Living arrangements for the past 2 months: Single Family Home                                       Social Drivers of Health (SDOH) Interventions SDOH Screenings   Food Insecurity: No Food Insecurity (06/14/2020)   Received from [facility]  Social Connections: Unknown (08/07/2021)   Received from [facility]  Tobacco Use: Medium Risk (02/03/2024)    Readmission Risk Interventions     No data to display

## 2024-02-04 NOTE — Progress Notes (Signed)
   02/04/24 2332  BiPAP/CPAP/SIPAP  $ Non-Invasive Home Ventilator  Subsequent  BiPAP/CPAP/SIPAP Pt Type Adult  BiPAP/CPAP/SIPAP Resmed  Reason BIPAP/CPAP not in use Non-compliant (Pt states he doesn't wish to wear tonight. Will call if he changes his mind)

## 2024-02-04 NOTE — Progress Notes (Signed)
 NAME:  George Nielsen, MRN:  [id number], DOB:  [birth year], LOS: 6 ADMISSION DATE:  01/29/2024, CONSULTATION DATE:  01/29/2024 REFERRING MD:  Pleas BIRCH, CHIEF COMPLAINT:  AMS, AHHRF  History of Present Illness:  George Nielsen is a 68 year old male with past medical history significant for Stage IV tongue base carcinoma s/p radiation/chemo (2016) w/ prior trach/PEG, COPD on CPAP and oxygen PRN, chronic pain, hypothyroidism, GERD, anemia, protein/calorie malnutrition who presented to ED via EMS for AMS, abnormal breathing, hypoxia. Patient reportedly eating breakfast this morning after taking his morning medications when he became unresponsive. EMS reported patient w/ pinpoint pupils and altered from baseline and was given Narcan with some improvement. Of note, family reports he was recently prescribed oxygen PRN for trouble breathing, especially in the morning and with exertion. Labs notable for respiratory acidosis w/ hypoxia, hypoalbuminemia, BNP 192, otherwise benign. PCCM consulted for ICU evaluation.   On exam, patient oxygenating well on HFNC 10L with SpO2 >94%. Patient did not follow commands, but localized to noxious stimuli and only opened eyes to oral suctioning and grabbed for the Yankauer. Food particles noted on oral inspection. Plan to admit to ICU for airway watch, place on BiPAP, start antibiotics, CTH/UA/UDS pending to eval AMS.   GOC with HCPOA, patient made DNR/DNI.   He had MBS on 11/6 which demonstrated profound dysphagia. SLP recommending NPO. IR consulted for PEG tube which was attempted 11/11 but unsuccessful due to anatomy. 11/11 General surgery consulted for surgical G tube. 11/11 PCCM called back for pulm clearance prior to surgery.  Pertinent  Medical History  HTN, OSA  Significant Hospital Events: Including procedures, antibiotic start and stop dates in addition to other pertinent events   11/5 Hypoxic, AMS>ED>BiPAP 11/6 BiPAP > HFNC. Transferred out of  ICU 11/6 MBS with profound dysphagia 11/11 IR attempted G tube and not successful 2/2 anatomy 11/11 General surgery consult 11/11 PCCM consulted again for pre-op pulm clearance for surgical G tube  Interim History / Subjective:  On 4 L O2. Denies increased cough, sputum production. Breathing comfortably.  Says COPD is severe from what he's been told. No PFT's found but per care everywhere, note from 2022 mentions severe obstruction with moderately decreased gas-transfer defect. He has not been on chronic O2 except for 3 weeks ago when he was told to use O2 on a PRN basis with activity and exertion. He denies any known difficulties with weaning from the vent back in 2016 when he had a tracheostomy while undergoing chemo.  Objective    Blood pressure 131/80, pulse 77, temperature 98.4 F (36.9 C), temperature source Oral, resp. rate 15, height 5' 9 (1.753 m), weight 58.2 kg, SpO2 98%.        Intake/Output Summary (Last 24 hours) at 02/04/2024 1350 Last data filed at 02/04/2024 0400 Gross per 24 hour  Intake 0 ml  Output 900 ml  Net -900 ml   Filed Weights   02/01/24 0500 02/02/24 0504 02/03/24 0500  Weight: 54 kg 53.6 kg 58.2 kg    Examination: General: Chronically ill appearing older male, resting in bed, in NAD. Neuro: A&O x 3, MAE's. HEENT: Chin tucked and head pointed to left chronically.  Sclerae anicteric. EOMI. Coarse and soft voice. MM dry. Cardiovascular: RRR, no M/R/G.  Lungs: Respirations even and unlabored.  CTA bilaterally, No W/R/R. Abdomen: BS x 4, soft, NT/ND.  Musculoskeletal: No gross deformities, no edema.    Assessment and Plan   Acute on chronic hypoxic respiratory  failure -was hypoxic and hypercapnic on initial admission, recently started on PRN O2 just 3 weeks ago. Hx COPD - No PFT's found but per care everywhere, note from 2022 mentions severe obstruction with moderately decreased gas-transfer defect. Hx OSA on CPAP. - Continue  supplemental O2 as needed to maintain SpO2 > 92%. - Would consider extubating him to BiPAP initially (if undergoes surgery). - Initiate triple neb therapy with Brovana/Budesonide/Yupelri. - Push IS and routine bronchial hygiene. - Careful attention to analgesia post op - avoid oversedation. - Continue with nocturnal CPAP therapy.  Pre-op clearance requested based on pulmonary history. Discussion: He has severe COPD by report (no formal PFT's available but severe obstruction seen on notes from 2022). He has not required chronic O2 and just recently, 3 weeks ago was started on O2 on a PRN basis. He denies any recent exacerbations or illnesses. He does not recall anyone mentioning any difficulty with vent weaning back in 2016 when he required trach/vent while undergoing chemo.  I had long discussion with pt and his fiance along with general surgery PA at the bedside. We discussed whether prolonging his life without being able to eat and only receiving nutrition via G tube is appropriate with his wishes. He has been wanting to eat and drink at least small portions of food/liquid but has been NPO due to profound dysphagia. This is certainly a challenging situation because if he were to eat, he would ultimately have recurrent aspirations leading to worsening respiratory failure etc. On the flip side, he finds joy in foods as most do; therefore, a decision needs to be made on which is more important to him. Both surgery and I have suggested he meet with Palliative Care to provide further input on goals of care.  If he were to proceed with surgery, as a 68 y.o. M with multiple co-morbidities, he has at least a moderately increased risk of post operative pulmonary complications compared to the general population of the same age without pre-existing lung disease. However, if he wishes to pursue with surgery, it can be performed with acceptable risk with close attention to his peri-operative pulmonary care as  mentioned above under his active pulmonary problem list. In short, there are no absolute contraindications for him to undergo general anesthesia for surgical G-tube placement.    Rest per primary team.    Sammi Gore, PA - [facility] For pager details, please see AMION or use Epic chat  After 1900, please call [facility] for cross coverage needs 02/04/2024, 2:25 PM

## 2024-02-04 NOTE — Progress Notes (Signed)
 SATURATION QUALIFICATIONS: (This note is used to comply with regulatory documentation for home oxygen)  Patient Saturations on Room Air at Rest = 86%  Patient Saturations on Room Air while Ambulating = N/A  Patient Saturations on 4 Liters of oxygen while Resting = 90%  Please briefly explain why patient needs home oxygen:  Attempted to wean pt to RA once up in chair, SpO2 desat to 86% on RA, despite cues for pursed-lip breathing, needed 4L O2 [place] to maintain SpO2 at 90% and above, RN notified.

## 2024-02-04 NOTE — Progress Notes (Signed)
 Interventional Radiology Progress Note  Attempted gastrostomy tube placement in IR. Based on CT prior to procedure, only narrow window to approach colon where old g-tube was placed below the transverse colon, with the transverse colon and splenic flexure interposed between the rest of the stomach and abdominal wall.  5 Fr orogastric catheter advanced into stomach. Once stomach insufflated with air, window under fluoroscopy became even more difficult, with persistent colonic interposition. Procedure aborted due to significant risk of colonic injury with attempt at percutaneous gastrostomy.  If the patient is in continued need of supplemental enteric nutrition, recommend General Surgical consultation to see if surgical gastrostomy is feasible.  George Nielsen. [place], M.D Pager:  [phone]

## 2024-02-04 NOTE — Progress Notes (Signed)
 Physical Therapy Treatment Patient Details Name: George Nielsen MRN: [id number] DOB: [birth year] Today's Date: 02/04/2024   History of Present Illness The pt is a 68 yo male presenting 11/05 with abnormal breathing and somnolence. Pt given narcan by EMS with improvement. Pt admitted to ICU due to respiratory failure, on BiPAP 11/5-6. PMH includes: stage IV tongue base carcinoma s/p radiation and chemo, cervical dystonia s/p ACDF 2022, dysphagia, GERD, CKD, emphysema of lung, COPD on CPAP and oxygen PRN, chronic pain, hypothryoidism, GERD, anemia, and protein/calorie malnutrition.    PT Comments  Pt received in supine, reluctantly agreeable to therapy session with plan for assessing his ambulatory sats and further assessing disposition needs. Pt quick to fatigue and c/o severe pain in neck, only able to tolerate step pivot from bed to chair with RW support prior to pt requesting to rest. Pt refusing ambulation today. SpO2 desat to 86% resting in chair on RA so needed 4L O2 [place] applied to maintain SpO2 WFL, RN notified. DME updated below as per discussion with supervising PT Katie Z and pt reports he is likely to refuse AIR, preferring to go home. Pt will need increased assist at home now that he needs supplemental O2. Patient will benefit from intensive inpatient follow-up therapy, >3 hours/day, but may refuse, continue to monitor.   If plan is discharge home, recommend the following: A lot of help with walking and/or transfers;A little help with bathing/dressing/bathroom;Assistance with cooking/housework;Help with stairs or ramp for entrance (pt too fatigued to ambulate today)   Can travel by private vehicle        Equipment Recommendations  BSC/3in1;Wheelchair (measurements PT);Wheelchair cushion (measurements PT) (pt may refuse WC but not able to ambulate past couple sessions around the room)    Recommendations for Other Services       Precautions / Restrictions Precautions Precautions:  Fall Recall of Precautions/Restrictions: Impaired Precaution/Restrictions Comments: not on O2 baseline, decreased insight into deficits Restrictions Weight Bearing Restrictions Per Provider Order: No     Mobility  Bed Mobility Overal bed mobility: Needs Assistance Bed Mobility: Supine to Sit     Supine to sit: Contact guard     General bed mobility comments: increased time, to L EOB, no rails and HOB flat per home set-up. Pt states I can't do this but with encouragement he was able to perform.    Transfers Overall transfer level: Needs assistance Equipment used: Rolling walker (2 wheels) Transfers: Sit to/from Stand, Bed to chair/wheelchair/BSC Sit to Stand: Contact guard assist   Step pivot transfers: Contact guard assist       General transfer comment: from EOB, pivotal steps toward chiar on his L side, O2 doffed during transfer due to short cannula tubing, pt requesting to stay on RA, VSS for short pivot transfer on RA but once in chiar >3 mins, SpO2 desat so pt placed back on 4L O2 [place], RN notified.    Ambulation/Gait               General Gait Details: pt refusing today, c/o increased neck pain and fatigue.   Stairs             Wheelchair Mobility     Tilt Bed    Modified Rankin (Stroke Patients Only)       Balance Overall balance assessment: Needs assistance Sitting-balance support: No upper extremity supported, Feet supported Sitting balance-Leahy Scale: Fair     Standing balance support: During functional activity, Bilateral upper extremity supported, Reliant on assistive device  for balance Standing balance-Leahy Scale: Poor Standing balance comment: improved posture with RW support; did not assess unsupported 2/2 pt fatigue/pain.                            Communication Communication Communication: Impaired Factors Affecting Communication: Reduced clarity of speech  Cognition Arousal: Alert Behavior During Therapy: Flat  affect                             Following commands: Impaired Following commands impaired: Follows one step commands with increased time    Cueing Cueing Techniques: Verbal cues  Exercises      General Comments General comments (skin integrity, edema, etc.): SpO2 initially 93% standing and in chair on RA, but desat to 86% after ~3 mins resting and did not improve with PLB cues, so pt placed back on 4L/min. HR WFL.      Pertinent Vitals/Pain Pain Assessment Pain Assessment: Faces Faces Pain Scale: Hurts even more Pain Location: neck/throat Pain Descriptors / Indicators: Discomfort, Guarding, Grimacing Pain Intervention(s): Limited activity within patient's tolerance, Monitored during session, Repositioned, Patient requesting pain meds-RN notified    Home Living                          Prior Function            PT Goals (current goals can now be found in the care plan section) Acute Rehab PT Goals Patient Stated Goal: to return home PT Goal Formulation: With patient Time For Goal Achievement: 02/15/24 Progress towards PT goals: Progressing toward goals (slowly)    Frequency    Min 2X/week      PT Plan      Co-evaluation              AM-PAC PT 6 Clicks Mobility   Outcome Measure  Help needed turning from your back to your side while in a flat bed without using bedrails?: A Little Help needed moving from lying on your back to sitting on the side of a flat bed without using bedrails?: A Little Help needed moving to and from a bed to a chair (including a wheelchair)?: A Little Help needed standing up from a chair using your arms (e.g., wheelchair or bedside chair)?: A Little Help needed to walk in hospital room?: Total Help needed climbing 3-5 steps with a railing? : Total 6 Click Score: 14    End of Session Equipment Utilized During Treatment: Gait belt;Oxygen Activity Tolerance: Patient limited by fatigue;Patient limited by  pain Patient left: in chair;with call bell/phone within reach;with chair alarm set;with family/visitor present;Other (comment) (fiance in room) Nurse Communication: Mobility status;Patient requests pain meds;Other (comment) (pt refusing ambulation; needed 4L donned in chair) PT Visit Diagnosis: Unsteadiness on feet (R26.81);Other abnormalities of gait and mobility (R26.89);Muscle weakness (generalized) (M62.81)     Time: 1531-1550 PT Time Calculation (min) (ACUTE ONLY): 19 min  Charges:    $Therapeutic Activity: 8-22 mins PT General Charges $$ ACUTE PT VISIT: 1 Visit                     Maley Venezia P., PTA Acute Rehabilitation Services Secure Chat Preferred 9a-5:30pm Office: [phone]    Connell HERO [place] 02/04/2024, 4:27 PM

## 2024-02-04 NOTE — Progress Notes (Signed)
 [facility]  PROGRESS NOTE  George Nielsen FMW:[id number] DOB: 1955-12-29 DOA: 01/29/2024 [facility]CP: [facility]   Brief HPI:   68 year old male with past medical history significant for Stage IV tongue base carcinoma s/p radiation/chemo (2016) w/ prior trach/PEG, COPD on CPAP and oxygen PRN, chronic pain, hypothyroidism, GERD, anemia, protein/calorie malnutrition who presented to ED via EMS for AMS, abnormal breathing, hypoxia. Patient reportedly eating breakfast this morning after taking his morning medications when he became unresponsive. EMS reported patient w/ pinpoint pupils and altered from baseline and was given Narcan with some improvement. Of note, family reports he was recently prescribed oxygen PRN for trouble breathing, especially in the morning and with exertion. Labs notable for respiratory acidosis w/ hypoxia, hypoalbuminemia, BNP 192, otherwise benign. PCCM consulted for ICU evaluation. patient oxygenating well on HFNC 10L with SpO2 >94%. Patient did not follow commands, but localized to noxious stimuli and only opened eyes to oral suctioning and grabbed for the Yankauer. Food particles noted on oral inspection and food particles suctioned out of airway.George Nielsen He was admitted  to ICU for airway watch, place on BiPAP, started antibiotics.  Patient has been n.p.o. since 11/5 and speech therapy recommended strict n.p.o. Patient declined contract placement and has requested placement of a PEG tube.  IR consulted and plan is to place PEG tube 02/04/2024.    Assessment/Plan:   Acute on chronic hypoxic respi failure Likely acute on chronic hypercarbic failure On 3L of oxygen, d/w RN to wean down as tolerated. Likely acute on chronic hypercarbic failure   Acute metabolic encephalopathy- resolved   Hypertension - Currently n.p.o. Hydralazine as needed IV for SBP greater than 160.   Dysphagia, odynophagia  Severe protein-calorie malnutrition; hypoalbuminemia Patient made  NPO since 11/5 speech evaluation commendation Patient declined cortrak placement and states  He doesn't care if he misses meds or nutrition through the weekend. He jsut said he absolutely doesn't want to have a tube placed in his nose. He would, however, like to have his PEG replaced  Consulted IR for PEG placement. PEG placement attempted today however IR could not place PEG tube -General Surgery consulted, recommended pulmonary evaluation before surgery -Both pulmonology and general surgery recommended palliative care consult -Palliative care consulted - Patient has been having episodes of hypoglycemia, will start D5 half-normal saline at 40 mL/h   Cervical dystonia s/p ACDF (2022) Stage IV tongue base carcinoma s/p radiation/chemo (2016) prior trach/PEG - PT/OT consulted  - Evaluation reviewed, patient is a potential candidate for inpatient rehab.  Consult has been placed.        DVT prophylaxis: Lovenox  Medications     arformoterol  15 mcg Nebulization BID   barium  450 mL Oral Once   budesonide (PULMICORT) nebulizer solution  0.5 mg Nebulization BID   Chlorhexidine  Gluconate Cloth  6 each Topical Daily   [START ON 02/05/2024] enoxaparin (LOVENOX) injection  40 mg Subcutaneous Q24H   lidocaine   20 mL Infiltration Once   pantoprazole  (PROTONIX ) IV  40 mg Intravenous Q12H   revefenacin  175 mcg Nebulization Daily     Data Reviewed:   CBG:  Recent Labs  Lab 02/04/24 0013 02/04/24 0427 02/04/24 0841 02/04/24 1118 02/04/24 1545  GLUCAP 73 75 87 68* 59*    SpO2: 98 % O2 Flow Rate (L/min): 4 L/min FiO2 (%): 32 %    Vitals:   02/04/24 0845 02/04/24 0853 02/04/24 1115 02/04/24 1507  BP: 129/61  131/80 126/80  Pulse: (!) 41 (!)  58 77 76  Resp: 14  15   Temp: 98.6 F (37 C)  98.4 F (36.9 C) 98.7 F (37.1 C)  TempSrc: Oral  Oral Oral  SpO2: 100%  98% 98%  Weight:      Height:          Data Reviewed:  Basic Metabolic Panel: Recent Labs  Lab  01/29/24 1245 01/29/24 1252 01/29/24 1253 01/29/24 1300 01/29/24 1609 01/29/24 1947 01/30/24 0330 01/30/24 1511 01/31/24 0408 02/01/24 0853  NA 140   < > 140 139  --  141 141  --  140 139  K 4.2   < > 4.1 4.1  --  3.9 3.6  --  3.8 3.7  CL 101  --  101  --   --   --  104  --  101 95*  CO2 28  --   --   --   --   --  30  --  29 30  GLUCOSE 112*  --  108*  --   --   --  97  --  103* 88  BUN 27*  --  29*  --   --   --  21  --  12 9  CREATININE 1.15  --  1.20  --  1.06  --  0.98  --  0.81 0.63  CALCIUM 8.1*  --   --   --   --   --  8.0*  --  8.7* 9.0  MG  --   --   --   --   --   --  1.1* 2.4 2.2  --   PHOS  --   --   --   --   --   --  3.7  --   --   --    < > = values in this interval not displayed.    CBC: Recent Labs  Lab 01/29/24 1245 01/29/24 1252 01/29/24 1609 01/29/24 1947 01/30/24 0330 01/31/24 0408 02/01/24 0853  WBC 8.3  --  9.0  --  6.6 6.9 7.4  NEUTROABS 7.8*  --   --   --   --   --   --   HGB 12.4*   < > 12.7* 11.9* 11.4* 12.2* 13.5  HCT 38.5*   < > 39.1 35.0* 35.2* 36.8* 40.4  MCV 97.5  --  95.8  --  95.7 93.4 92.7  PLT 169  --  172  --  175 187 196   < > = values in this interval not displayed.    LFT Recent Labs  Lab 01/29/24 1245 02/01/24 0853  AST 17 15  ALT 14 13  ALKPHOS 52 52  BILITOT 0.5 0.4  PROT 6.2* 6.0*  ALBUMIN 3.1* 2.8*     Antibiotics: Anti-infectives (From admission, onward)    Start     Dose/Rate Route Frequency Ordered Stop   02/04/24 0600  ceFAZolin  (ANCEF ) IVPB 2g/100 mL premix        2 g 200 mL/hr over 30 Minutes Intravenous To Radiology 02/03/24 1334 02/05/24 0600   01/30/24 1400  cefTRIAXone (ROCEPHIN) 2 g in sodium chloride  0.9 % 100 mL IVPB        2 g 200 mL/hr over 30 Minutes Intravenous Every 24 hours 01/30/24 1001 02/02/24 1400   01/29/24 1700  piperacillin-tazobactam (ZOSYN) IVPB 2.25 g  Status:  Discontinued        2.25 g 100 mL/hr over 30 Minutes Intravenous Every 8  hours 01/29/24 1633 01/29/24 1636    01/29/24 1700  piperacillin-tazobactam (ZOSYN) IVPB 3.375 g  Status:  Discontinued        3.375 g 12.5 mL/hr over 240 Minutes Intravenous Every 8 hours 01/29/24 1636 01/30/24 1001   01/29/24 1630  cefTRIAXone (ROCEPHIN) 2 g in sodium chloride  0.9 % 100 mL IVPB  Status:  Discontinued        2 g 200 mL/hr over 30 Minutes Intravenous Every 24 hours 01/29/24 1530 01/29/24 1633   01/29/24 1630  azithromycin  (ZITHROMAX ) 500 mg in sodium chloride  0.9 % 250 mL IVPB        500 mg 250 mL/hr over 60 Minutes Intravenous Every 24 hours 01/29/24 1530 01/31/24 1759        CONSULTS IR  Code Status: Full code  Family Communication: Discussed with patient's wife at bedside     Subjective   IR attempted gastrostomy tube placement however failed to place tube due to patient's difficulty with persistent colonic interposition.  Procedure aborted due to significant risk of colonic injury with attempt at percutaneous gastrostomy.   Objective    Physical Examination:  Appears in no acute distress S1-S2, regular Lungs clear to auscultation bilaterally Abdomen is soft, nontender, no organomegaly     Status is: Inpatient:             Sabas GORMAN Brod   [facility] If 7PM-7AM, please contact night-coverage at [email or web address], Office  [phone]   02/04/2024, 6:14 PM  LOS: 6 days

## 2024-02-05 DIAGNOSIS — E43 Unspecified severe protein-calorie malnutrition: Secondary | ICD-10-CM

## 2024-02-05 DIAGNOSIS — Z66 Do not resuscitate: Secondary | ICD-10-CM

## 2024-02-05 DIAGNOSIS — R0902 Hypoxemia: Secondary | ICD-10-CM | POA: Diagnosis not present

## 2024-02-05 DIAGNOSIS — Z7189 Other specified counseling: Secondary | ICD-10-CM

## 2024-02-05 DIAGNOSIS — Z515 Encounter for palliative care: Secondary | ICD-10-CM

## 2024-02-05 DIAGNOSIS — Z789 Other specified health status: Secondary | ICD-10-CM

## 2024-02-05 DIAGNOSIS — R1310 Dysphagia, unspecified: Secondary | ICD-10-CM

## 2024-02-05 DIAGNOSIS — R4589 Other symptoms and signs involving emotional state: Secondary | ICD-10-CM

## 2024-02-05 LAB — GLUCOSE, CAPILLARY
Glucose-Capillary: 108 mg/dL — ABNORMAL HIGH (ref 70–99)
Glucose-Capillary: 139 mg/dL — ABNORMAL HIGH (ref 70–99)
Glucose-Capillary: 186 mg/dL — ABNORMAL HIGH (ref 70–99)
Glucose-Capillary: 63 mg/dL — ABNORMAL LOW (ref 70–99)
Glucose-Capillary: 76 mg/dL (ref 70–99)
Glucose-Capillary: 79 mg/dL (ref 70–99)
Glucose-Capillary: 80 mg/dL (ref 70–99)
Glucose-Capillary: 82 mg/dL (ref 70–99)

## 2024-02-05 MED ORDER — DEXTROSE 50 % IV SOLN
12.5000 g | INTRAVENOUS | Status: AC
  Administered 2024-02-05: 12.5 g via INTRAVENOUS
  Filled 2024-02-05: qty 50

## 2024-02-05 MED ORDER — DEXTROSE-SODIUM CHLORIDE 5-0.45 % IV SOLN: INTRAVENOUS | Status: AC

## 2024-02-05 NOTE — Consult Note (Signed)
 Palliative Care Consult Note                                  Date: 02/05/2024   Patient Name: George Nielsen  DOB: November 08, 1955  MRN: [id number]  Age / Sex: 68 y.o., male  [facility] Referring Physician: Sonjia Held, MD  Reason for Consultation: Establishing goals of care  Past Medical History:  Diagnosis Date   Allergy    Anemia    Anxiety    Arthritis    Cancer (HCC)    tongue and neck lymph nodes   COPD (chronic obstructive pulmonary disease) (HCC)    Dyspnea    climbing stairs, walking distance   Emphysema of lung (HCC)    GERD (gastroesophageal reflux disease)    Hepatitis C    treated   History of radiation therapy 11/10/14- 12/29/14   BOT and bilateral neck/ 70 Gy in 35 fractions to gross disease, 63 Gy in 35 fractions to high risk nodal echelons, and 56 Gy in 35 fraction to intermediate risk nodal echelons.    Hypertension    Hypothyroidism    PNA (pneumonia)    4 times   Sleep apnea    wears CPAP sometimes per pt.   05/25/19 - not able to use at this time due to neck pain.   Thyroid  disease    take Synthroid  d/t scar tissue from radiation around thyroid    Ulcer     Subjective:   This NP Camellia Kays reviewed medical records, received report from team, assessed the patient and then meet at the patient's bedside to discuss diagnosis, prognosis, GOC, EOL wishes disposition and options.  Before meeting with the patient/family, I spent time reviewing the chart notes including progression of from yesterday, IR note from yesterday, dietitian note from yesterday, general surgery note from yesterday, hospitalist note from yesterday, pulmonary note from yesterday, PT note from yesterday. I also reviewed vital signs, nursing flowsheets, medication administrations record, labs, and imaging. Labs reviewed include serial glucose readings in the setting of prolonged n.p.o. status and severe protein calorie  malnutrition showing occasional dips in blood sugar as low as 59, has been treated with D50.  I met with the patient at bedside, his fiance Berwyn is also present.   We meet to discuss diagnosis prognosis, GOC, EOL wishes, disposition and options. Concept of Palliative Care was introduced as specialized medical care for people and their families living with serious illness.  If focuses on providing relief from the symptoms and stress of a serious illness.  The goal is to improve quality of life for both the patient and the family. Values and goals of care important to patient and family were attempted to be elicited.  Created space and opportunity for patient  and family to explore thoughts and feelings regarding current medical situation   Natural trajectory and current clinical status were discussed. Questions and concerns addressed. Patient  encouraged to call with questions or concerns.    Patient/Family Understanding of Illness: He understands that he is in a very bad situation.  He states that he is stuck, cannot move forward or backward.  He states that being in the hospital feels like nothing is being done, though he understands there is not much they can do.  He states that currently it is equivalent to passing in the wind.  We spent time talking about severe dysphagia, the difficulty that  this poses, the context of previous serious illness including cancer status post trach.  We talked about acute hypoxic respiratory failure with underlying lung disease and aspiration that caused a worsening respiratory situation,  Life Review: His significant other Berwyn is his designated HCPOA.  They have paperwork at home and Berwyn will try to bring this in.  The rest of his family lives on the [address]  Patient Values: Quality of life, staying at home rather than the hospital  Today's Discussion: In addition to discussions described we had extensive discussion of various topics.  We  talked in detail about difficulty that his severe dysphagia presents.  He is currently n.p.o. and unable to eat/drink.  He has previously had a PEG tube, and they are offering the possibility of a gastric tube.  IR evaluated and they anatomically cannot place a PEG tube for him.  Surgery is awaiting goals of care discussion, pulmonary has cleared him from their perspective.  We spent a lot of time talking about quality of life and what that means for him.  We discussed that a PEG tube would simply provide calories but statistically does not prolong life or improve quality of life.  If he had a PEG tube it would provide him nutrition, but he would still would not be allowed to eat.  This could potentially buy him a little more time.  However, we discussed limitations of PEG tubes including the do not prevent aspiration and if he chose to eat/drink with the PEG tube it would continue to limit his time with recurrent aspiration rendering the benefit of a PEG tube void.  We talked about another option of allowing him to eat/drink what he wants with accepted risk of aspiration, further pulmonary decline.  However, this would likely result to him coming back and forth to the hospital repeatedly.  He does not want to do this.  We talked about another option of hospice care.  With hospice care he would discharge home from the hospital, would not have to come back, would not have to continue to get labs and doctors visits.  He could stay at home and focus on maximizing his quality of life including eating and drinking as he would choose with the excepted risk of likely aspiration and declined.  When his health declines and symptoms become burdensome from instigated recurrent aspiration and hospice care could provide for his comfort, peace, dignity as his end-of-life approaches.  I described hospice as a service for patients who have a life expectancy of 6 months or less. The goal of hospice is the preservation of  dignity and quality at the end phases of life. Under hospice care, the focus changes from curative to symptom relief. I explained the three setting where hospice services can be provided including the home, at a living facility (such as LTC SNF, Assisted Living, etc), and a hospice facility. I explained that acceptance to hospice in any specific location is the final decision of the hospice medical director and bed availability, if applicable. They verbalized understanding.  At the end of her conversation they are open to a hospice referral to discuss possible services.  I provided a contact card for questions/concerns while he is admitted.  He asks how long he has to stay in the hospital.  I shared that if he elects hospice we can often get people home same day or next day.  He seems to be interested leaving as soon as possible with no intent to  return.  I shared that I would discuss with TOC and they would help make a referral to an appropriate agency.  We also discussed CODE STATUS.  The end of our discussion he elected to transition to DNR decimated (DNR/DNI).  I provided emotional and general support through therapeutic listening, empathy, sharing of stories, and other techniques. I answered all questions and addressed all concerns to the best of my ability.  Goals: Changed to DNR-limited, referral for home hospice at discharge to focus on quality of life, continue current scope of care in the meantime  Review of Systems  Constitutional:        Denies pain in general  Respiratory:  Negative for shortness of breath.   Cardiovascular:  Negative for chest pain.  Gastrointestinal:  Negative for abdominal pain, nausea and vomiting.    Objective:   Primary Diagnoses: Present on Admission:  Hypoxia   Vital Signs:  BP 139/82 (BP Location: Left Arm)   Pulse 73   Temp 97.7 F (36.5 C) (Oral)   Resp 14   Ht 5' 9 (1.753 m)   Wt 58.2 kg   SpO2 93%   BMI 18.95 kg/m   Physical  Exam Vitals and nursing note reviewed.  Constitutional:      General: He is not in acute distress.    Appearance: He is ill-appearing.  Cardiovascular:     Rate and Rhythm: Normal rate.  Pulmonary:     Effort: Pulmonary effort is normal. No respiratory distress.  Abdominal:     General: Abdomen is flat.  Skin:    General: Skin is warm and dry.  Neurological:     General: No focal deficit present.     Mental Status: He is alert and oriented to person, place, and time.  Psychiatric:        Mood and Affect: Mood normal.        Behavior: Behavior normal.     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 Individuals present for the conversation: The patient Ikechukwu Cerny; patient's fianc Berwyn; Camellia Kays, NP  Summary of the conversation: We discussed CODE STATUS, details of his chronic health history, acute presentation, options moving forward from continued aggressive care including consideration of gastric tube via surgery versus transition to a more comfort/hospice focused approach and multiple points between.  Outcome of the conversations and/or documents completed: Transition from full code to DNR-Limited, [facility] consult for outpatient hospice referral to discuss hospice as an option.  Anticipate discharge home with hospice after conversation with hospice of the [place]  I spent 30 minutes providing separately identifiable ACP services with the patient and/or surrogate decision maker in a voluntary, in-person conversation discussing the patient's wishes and goals as detailed in  the above note.  Assessment & Plan:   HPI/Patient Profile: 68 y.o. 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  He was admitted on 01/29/2024 with acute on chronic hypercarbic respiratory failure, severe COPD, acute metabolic encephalopathy, hypertension, dysphagia, odynophagia,  severe protein calorie malnutrition, hypoalbuminemia, and others.   Palliative medicine was consulted for GOC conversations  SUMMARY OF RECOMMENDATIONS   Changed to DNR decimated [facility] consult for referral to outpatient hospice Continue current scope of care in the meantime Ongoing supportive patient and family Palliative medicine will continue to follow  Symptom Management:  Per primary team Palliative medicine is available to assist as needed  Code Status: DNR - Limited (DNR/DNI)  Prognosis:  < 6 months  Discharge Planning:  Home with Hospice   Discussed with: Patient, family, medical team, nursing team    Thank you for allowing us  to participate in the care of ISAO SELTZER PMT will continue to support holistically.  Billing based on MDM: High  Problems Addressed: One acute or chronic illness or injury that poses a threat to life or bodily function  Risks: Decision not to resuscitate or to de-escalate care because of poor prognosis  Detailed review of medical records (labs, imaging, vital signs), medically appropriate exam, discussed with treatment team, counseling and education to patient, family, & staff, documenting clinical information, medication management, coordination of care  Signed by: Camellia Kays, NP Palliative Medicine Team  Team Phone # [phone] (Nights/Weekends)  02/05/2024, 10:24 AM

## 2024-02-05 NOTE — Progress Notes (Signed)
 [facility] 6T92 [facility] Liaison Note  Received request from Kelli, Transitions of Care Manager, for hospice services at home after discharge. Spoke with George Nielsen to initiate education related to hospice philosophy, services, and team approach to care. He verbalized understanding of information given. Asked if I could call his significant other George Nielsen to discuss and he said yes. Told him that I would follow up with him tomorrow to see if he is agreeable to home hospice.   DME needs discussed. Patient has oxygen and a walker in the home. Patient/family requests the following equipment for delivery: bedside commode and shower chair. DME will not be ordered until pt makes decision on dc plan.   [facility] information and contact numbers given to [employer]. Above information shared with Transitions of Care Manager. Please call with any questions or concerns.  Thank you for the opportunity to participate in this patient's care.   Amy Darien BSN, RN [facility] Liaison [phone]

## 2024-02-05 NOTE — Plan of Care (Signed)

## 2024-02-05 NOTE — Progress Notes (Signed)
 Nutrition Brief Note  Reached out to MD to follow up on nutrition recommendations for TPN given severe malnutrition POA and NPO x7 days.   Continue to await Palliative Care discussion for goals of care.  Considering Surgical PEG tube placement versus comfort eating in the setting of severe dysphagia.  Patient continues to refuse Cortrak placement in the meantime to address nutritional needs.   Will continue to follow and adjust nutrition recommendations based on plan of medical care.    Allie Gideon Burstein, RDN, LDN Clinical Nutrition See [facility] for contact information.

## 2024-02-05 NOTE — Progress Notes (Signed)
 PROGRESS NOTE  George Nielsen FMW:[id number] DOB: 04/02/1955 DOA: 01/29/2024 [facility]   LOS: 7 days   Brief narrative:  68 year old male with past medical history of stage IV tongue base carcinoma s/p radiation/chemo (2016) with prior trach/PEG, COPD on CPAP and oxygen PRN, chronic pain, hypothyroidism, GERD, anemia, protein/calorie malnutrition presented to hospital with altered mental status abdominal breathing and hypoxia.  Patient was unresponsive after his breakfast and EMS reported that patient had pinpoint pupils and was given  Narcan with some improvement. Of note, family reports he was recently prescribed oxygen PRN for trouble breathing, especially in the morning and with exertion. Labs with notable for respiratory acidosis w/ hypoxia, hypoalbuminemia, BNP 192, otherwise within normal range.  Patient initially required HFNC 10L with SpO2 >94%. Patient did not follow commands, but localized to noxious stimuli and only opened eyes to oral suctioning and grabbed for the Yankauer. Food particles noted on oral inspection and food particles suctioned out of airway.[name] He was admitted  to ICU for airway watch, place on BiPAP, started antibiotics.  Patient has been n.p.o. since 11/5 and speech therapy recommended strict n.p.o. Patient declined cortrak tube placement and IR and general surgery was consulted for PEG tube placement.  At this time palliative care discussion underway.       Assessment/Plan: Principal Problem:   Hypoxia Active Problems:   Protein-calorie malnutrition, severe  Acute on chronic hypercarbic respiratory failure Severe COPD Currently on 4 L of oxygen by nasal cannula..  Seen by pulmonary during hospitalization   Acute metabolic encephalopathy- resolved   Hypertension Currently on hydralazine IV as needed.   Dysphagia, odynophagia  Severe protein-calorie malnutrition; hypoalbuminemia Body mass index is 18.95 kg/m.  Nutrition  Status: Nutrition Problem: Severe Malnutrition Etiology: chronic illness (hx cancer) Signs/Symptoms: severe muscle depletion, severe fat depletion Interventions: Tube feeding, Prostat   Seen by speech therapy and patient is n.p.o. since 01/29/2024.  Patient declined cortrak tube tube.  Patient does have PEG tube in place with he wants to be replaced but IR wasted pulmonary evaluation prior to PEG tube placement.  PEG tube attempted by IR but could not be placed or general surgery was consulted.  Pulmonary and neurosurgery both recommend palliative care at this time.  Patient has been having episodes of hypoglycemia on D5 half-normal saline.  Follow-up palliative care recommendations.  Has not had bowel movements in 1 week.    Cervical dystonia s/p ACDF (2022) Stage IV tongue base carcinoma s/p radiation/chemo (2016) prior trach/PEG PT OT has recommended CIR.  Goals of care patient seen by palliative care and at this time plan for home hospice on discharge.  DVT prophylaxis: enoxaparin (LOVENOX) injection 40 mg Start: 02/05/24 1400 SCDs Start: 01/29/24 1407   Disposition: Likely home with hospice in 1 to 2 days  Status is: Inpatient Remains inpatient appropriate because: Goals of care discussion underway, pending clinical improvement,    Code Status:     Code Status: Limited: Do not attempt resuscitation (DNR) -DNR-LIMITED -Do Not Intubate/DNI   Family Communication: Spoke with the patient's significant other at bedside  Consultants: Palliative care Critical care/pulmonary Interventional radiology General Surgery   Procedures: BiPAP  Anti-infectives:  None  Anti-infectives (From admission, onward)    Start     Dose/Rate Route Frequency Ordered Stop   02/04/24 0600  ceFAZolin  (ANCEF ) IVPB 2g/100 mL premix        2 g 200 mL/hr over 30 Minutes Intravenous To Radiology 02/03/24 1334 02/05/24 0600  01/30/24 1400  cefTRIAXone (ROCEPHIN) 2 g in sodium chloride  0.9 % 100 mL  IVPB        2 g 200 mL/hr over 30 Minutes Intravenous Every 24 hours 01/30/24 1001 02/02/24 1400   01/29/24 1700  piperacillin-tazobactam (ZOSYN) IVPB 2.25 g  Status:  Discontinued        2.25 g 100 mL/hr over 30 Minutes Intravenous Every 8 hours 01/29/24 1633 01/29/24 1636   01/29/24 1700  piperacillin-tazobactam (ZOSYN) IVPB 3.375 g  Status:  Discontinued        3.375 g 12.5 mL/hr over 240 Minutes Intravenous Every 8 hours 01/29/24 1636 01/30/24 1001   01/29/24 1630  cefTRIAXone (ROCEPHIN) 2 g in sodium chloride  0.9 % 100 mL IVPB  Status:  Discontinued        2 g 200 mL/hr over 30 Minutes Intravenous Every 24 hours 01/29/24 1530 01/29/24 1633   01/29/24 1630  azithromycin  (ZITHROMAX ) 500 mg in sodium chloride  0.9 % 250 mL IVPB        500 mg 250 mL/hr over 60 Minutes Intravenous Every 24 hours 01/29/24 1530 01/31/24 1759        Subjective: Today, patient was seen and examined at bedside.  Patient feels bad all over.  Complains of body pain and being uncomfortable.  Wants to go home with hospice.  Objective: Vitals:   02/05/24 0827 02/05/24 1138  BP:    Pulse:  (P) 78  Resp:    Temp:  (P) 98 F (36.7 C)  SpO2: 95% (P) 90%    Intake/Output Summary (Last 24 hours) at 02/05/2024 1209 Last data filed at 02/05/2024 0700 Gross per 24 hour  Intake --  Output 925 ml  Net -925 ml   Filed Weights   02/01/24 0500 02/02/24 0504 02/03/24 0500  Weight: 54 kg 53.6 kg 58.2 kg   Body mass index is 18.95 kg/m.   Physical Exam: GENERAL: Patient is alert awake and oriented. Not in obvious distress.  Thinly built HENT: No scleral pallor or icterus. Pupils equally reactive to light. Oral mucosa is moist NECK: is supple, no gross swelling noted. CHEST: Decreased breath sounds bilaterally  CVS: S1 and S2 heard, no murmur. Regular rate and rhythm.  ABDOMEN: Soft, non-tender, bowel sounds are present. EXTREMITIES: No edema. CNS: Cranial nerves are intact. No focal motor deficits. SKIN:  warm and dry without rashes.  Data Review: I have personally reviewed the following laboratory data and studies,  CBC: Recent Labs  Lab 01/29/24 1245 01/29/24 1252 01/29/24 1609 01/29/24 1947 01/30/24 0330 01/31/24 0408 02/01/24 0853  WBC 8.3  --  9.0  --  6.6 6.9 7.4  NEUTROABS 7.8*  --   --   --   --   --   --   HGB 12.4*   < > 12.7* 11.9* 11.4* 12.2* 13.5  HCT 38.5*   < > 39.1 35.0* 35.2* 36.8* 40.4  MCV 97.5  --  95.8  --  95.7 93.4 92.7  PLT 169  --  172  --  175 187 196   < > = values in this interval not displayed.   Basic Metabolic Panel: Recent Labs  Lab 01/29/24 1245 01/29/24 1252 01/29/24 1253 01/29/24 1300 01/29/24 1609 01/29/24 1947 01/30/24 0330 01/30/24 1511 01/31/24 0408 02/01/24 0853  NA 140   < > 140 139  --  141 141  --  140 139  K 4.2   < > 4.1 4.1  --  3.9 3.6  --  3.8 3.7  CL 101  --  101  --   --   --  104  --  101 95*  CO2 28  --   --   --   --   --  30  --  29 30  GLUCOSE 112*  --  108*  --   --   --  97  --  103* 88  BUN 27*  --  29*  --   --   --  21  --  12 9  CREATININE 1.15  --  1.20  --  1.06  --  0.98  --  0.81 0.63  CALCIUM 8.1*  --   --   --   --   --  8.0*  --  8.7* 9.0  MG  --   --   --   --   --   --  1.1* 2.4 2.2  --   PHOS  --   --   --   --   --   --  3.7  --   --   --    < > = values in this interval not displayed.   Liver Function Tests: Recent Labs  Lab 01/29/24 1245 02/01/24 0853  AST 17 15  ALT 14 13  ALKPHOS 52 52  BILITOT 0.5 0.4  PROT 6.2* 6.0*  ALBUMIN 3.1* 2.8*   Recent Labs  Lab 01/29/24 1245  LIPASE 20   No results for input(s): AMMONIA in the last 168 hours. Cardiac Enzymes: No results for input(s): CKTOTAL, CKMB, CKMBINDEX, TROPONINI in the last 168 hours. BNP (last 3 results) Recent Labs    01/29/24 1245  BNP 192.1*    ProBNP (last 3 results) No results for input(s): PROBNP in the last 8760 hours.  CBG: Recent Labs  Lab 02/05/24 0126 02/05/24 0202 02/05/24 0421  02/05/24 0728 02/05/24 1139  GLUCAP 63* 186* 139* 108* 80   Recent Results (from the past 240 hours)  Culture, blood (Routine x 2)     Status: None   Collection Time: 01/29/24 12:44 PM   Specimen: BLOOD  Result Value Ref Range Status   Specimen Description BLOOD BLOOD RIGHT ARM  Final   Special Requests   Final    BOTTLES DRAWN AEROBIC AND ANAEROBIC Blood Culture adequate volume   Culture   Final    NO GROWTH 5 DAYS Performed at [facility] Lab, [address], [place], [place] [address]    Report Status 02/03/2024 FINAL  Final  MRSA Next Gen by PCR, Nasal     Status: None   Collection Time: 01/29/24  2:06 PM   Specimen: Nasal Mucosa; Nasal Swab  Result Value Ref Range Status   MRSA by PCR Next Gen NOT DETECTED NOT DETECTED Final    Comment: (NOTE) The GeneXpert MRSA Assay (FDA approved for NASAL specimens only), is one component of a comprehensive MRSA colonization surveillance program. It is not intended to diagnose MRSA infection nor to guide or monitor treatment for MRSA infections. Test performance is not FDA approved in patients less than 22 years old. Performed at [facility], [address], [place], [place] [address]   Culture, blood (Routine x 2)     Status: None   Collection Time: 01/29/24  4:09 PM   Specimen: BLOOD RIGHT ARM  Result Value Ref Range Status   Specimen Description BLOOD RIGHT ARM  Final   Special Requests   Final  BOTTLES DRAWN AEROBIC AND ANAEROBIC Blood Culture adequate volume   Culture   Final    NO GROWTH 5 DAYS Performed at [facility], [address] [place], [place] [address]    Report Status 02/03/2024 FINAL  Final  Resp panel by RT-PCR (RSV, Flu A&B, Covid) Anterior Nasal Swab     Status: None   Collection Time: 02/01/24  6:44 PM   Specimen: Anterior Nasal Swab  Result Value Ref Range Status   SARS Coronavirus 2 by RT PCR NEGATIVE NEGATIVE Final   Influenza A by PCR NEGATIVE NEGATIVE Final   Influenza B by PCR  NEGATIVE NEGATIVE Final    Comment: (NOTE) The Xpert Xpress SARS-CoV-2/FLU/RSV plus assay is intended as an aid in the diagnosis of influenza from Nasopharyngeal swab specimens and should not be used as a sole basis for treatment. Nasal washings and aspirates are unacceptable for Xpert Xpress SARS-CoV-2/FLU/RSV testing.  Fact Sheet for Patients: bloggercourse.com  Fact Sheet for Healthcare Providers: seriousbroker.it  This test is not yet approved or cleared by the [place]  FDA and has been authorized for detection and/or diagnosis of SARS-CoV-2 by FDA under an Emergency Use Authorization (EUA). This EUA will remain in effect (meaning this test can be used) for the duration of the COVID-19 declaration under Section 564(b)(1) of the Act, 21 U.S.C. section 360bbb-3(b)(1), unless the authorization is terminated or revoked.     Resp Syncytial Virus by PCR NEGATIVE NEGATIVE Final    Comment: (NOTE) Fact Sheet for Patients: bloggercourse.com  Fact Sheet for Healthcare Providers: seriousbroker.it  This test is not yet approved or cleared by the [place]  FDA and has been authorized for detection and/or diagnosis of SARS-CoV-2 by FDA under an Emergency Use Authorization (EUA). This EUA will remain in effect (meaning this test can be used) for the duration of the COVID-19 declaration under Section 564(b)(1) of the Act, 21 U.S.C. section 360bbb-3(b)(1), unless the authorization is terminated or revoked.  Performed at [facility], [address] [place], [place] [address]      Studies: IR GASTROSTOMY TUBE MOD SED Result Date: 02/04/2024 CLINICAL DATA:  History of prior carcinoma of the tongue and prior gastrostomy tube placement. Respiratory failure and malnutrition. Need for supplemental nutrition. EXAM: ABORTED ATTEMPTED PERCUTANEOUS GASTROSTOMY TUBE PLACEMENT  ANESTHESIA/SEDATION: The patient received 1 mg IV Versed . CONTRAST:  None MEDICATIONS: 0.5 mg IV glucagon FLUOROSCOPY: Radiation Exposure Index: 5.0 mGy Kerma. PROCEDURE: The procedure, risks, benefits, and alternatives were explained to the patient. Questions regarding the procedure were encouraged and answered. The patient understands and consents to the procedure. A timeout was performed prior to initiating the procedure. Initial fluoroscopy was performed of the abdomen. A 5-French catheter was then advanced through the patient's mouth under fluoroscopy into the esophagus and to the level of the stomach. This catheter was used to insufflate the stomach with air under fluoroscopy. A hemostat was placed over the old gastrostomy tube scar at the skin. As additional areas insufflation was performed of the stomach, multiple fluoroscopic images were obtained in different projections to evaluate the stomach and its relationship to adjacent colon. The procedure was aborted without skin incision or attempted puncture of the stomach. COMPLICATIONS: None. FINDINGS: Initial fluoroscopy demonstrates gaseous distension of the transverse colon, splenic flexure and descending colon. After insertion of a 5 French catheter into the stomach, insufflation demonstrates interposition of the distal transverse colon and splenic flexure between much of the stomach and the anterior gastric wall. The old  gastrostomy tube site is inferior to the distal transverse colon and just medial to the splenic flexure. A safe percutaneous window was not present for gastric access without risk of colonic injury. The procedure was therefore aborted. IMPRESSION: Aborted gastrostomy tube placement attempt due to interposition of the colon between the stomach and abdominal wall. Minimal window was present inferior to the distal transverse colon and medial to the splenic flexure for gastric access at the site of prior gastrostomy tube placement. The  procedure was aborted as a safe percutaneous window was not present for gastric access without risk of colonic injury. Electronically Signed   By: Marcey Moan M.D.   On: 02/04/2024 10:33   DG Abd 1 View Result Date: 02/03/2024 EXAM: 1 VIEW XRAY OF THE ABDOMEN 02/03/2024 02:25:00 PM COMPARISON: CT 01/31/2024. CLINICAL HISTORY: Weight loss. FINDINGS: BOWEL: Nonobstructive bowel gas pattern. Enteric contrast in the distal colon. SOFT TISSUES: Surgical clip in mid abdomen. No opaque urinary calculi. BONES: Spinal fusion hardware spanning L4 through S1. Degenerative changes of the lumbar spine. No acute osseous abnormality. IMPRESSION: 1. No acute abdominal findings. Electronically signed by: Norman Gatlin MD 02/03/2024 07:21 PM EST RP Workstation: [id number]      Vernal Alstrom, MD  [facility] 02/05/2024  If 7PM-7AM, please contact night-coverage

## 2024-02-05 NOTE — Progress Notes (Signed)
 OT Cancellation Note  Patient Details Name: George Nielsen MRN: [id number] DOB: [birth year]   Cancelled Treatment:    Reason Eval/Treat Not Completed: Patient declined, no reason specified Patient politely declining at this time, OT will follow back as time permits.   Ronal Gift E. Donyae Kohn, OTR/L Acute Rehabilitation Services [phone]   Ronal Gift Salt 02/05/2024, 1:30 PM

## 2024-02-05 NOTE — TOC Progression Note (Signed)
 Transition of Care [facility]) - Progression Note    Patient Details  Name: George Nielsen MRN: [id number] Date of Birth: [birth year]  Transition of [facility]) CM/SW Contact  Andrez JULIANNA George, RN Phone Number: 02/05/2024, 4:10 PM  Clinical Narrative:     Pt has decided to talk with hospice services to see about discharging home with hospice instead of receiving PEG. Pt had no preference when CM offered choice. [facility] contacted and met with the patient. He has decided he prefers to talk with [facility] in case he will need to admit to residential hospice at some point. CM has sent referral to [facility].  IP Care management following.  Expected Discharge Plan: Home w Home Health Services Barriers to Discharge: Continued Medical Work up               Expected Discharge Plan and Services       Living arrangements for the past 2 months: Single Family Home                                       Social Drivers of Health (SDOH) Interventions SDOH Screenings   Food Insecurity: No Food Insecurity (06/14/2020)   [facility]  Social Connections: Unknown (08/07/2021)   Received from [facility]  Tobacco Use: Medium Risk (02/03/2024)    Readmission Risk Interventions     No data to display

## 2024-02-05 NOTE — Hospital Course (Signed)
 68 year old male with past medical history of stage IV tongue base carcinoma s/p radiation/chemo (2016) with prior trach/PEG, COPD on CPAP and oxygen PRN, chronic pain, hypothyroidism, GERD, anemia, protein/calorie malnutrition presented to hospital with altered mental status abdominal breathing and hypoxia.  Patient was unresponsive after his breakfast and EMS reported that patient had pinpoint pupils and was given  Narcan with some improvement. Of note, family reports he was recently prescribed oxygen PRN for trouble breathing, especially in the morning and with exertion. Labs with notable for respiratory acidosis w/ hypoxia, hypoalbuminemia, BNP 192, otherwise within normal range.  Patient initially required HFNC 10L with SpO2 >94%. Patient did not follow commands, but localized to noxious stimuli and only opened eyes to oral suctioning and grabbed for the Yankauer. Food particles noted on oral inspection and food particles suctioned out of airway.[name] He was admitted  to ICU for airway watch, place on BiPAP, started antibiotics.  Patient has been n.p.o. since 11/5 and speech therapy recommended strict n.p.o. Patient declined cortrak tube placement and IR and general surgery have been consulted for PEG tube placement.  At this time palliative care discussion underway.   Acute on chronic hypercarbic respiratory failure Currently on 4 L of oxygen by nasal cannula   Acute metabolic encephalopathy- resolved   Hypertension Currently on hydralazine IV as needed.   Dysphagia, odynophagia  Severe protein-calorie malnutrition; hypoalbuminemia Body mass index is 18.95 kg/m.  Nutrition Status: Nutrition Problem: Severe Malnutrition Etiology: chronic illness (hx cancer) Signs/Symptoms: severe muscle depletion, severe fat depletion Interventions: Tube feeding, Prostat   Seen by speech therapy and patient is n.p.o. since 01/29/2024.  Patient declined cortrak tube tube.  Patient does have PEG tube in place with  he wants to be replaced but IR wasted pulmonary evaluation prior to PEG tube placement.  PEG tube attempted by IR but could not be placed or general surgery was consulted.  Pulmonary and neurosurgery both recommend palliative care at this time.  Patient has been having episodes of hypoglycemia on D5 half-normal saline.  Follow-up palliative care recommendations.    Cervical dystonia s/p ACDF (2022) Stage IV tongue base carcinoma s/p radiation/chemo (2016) prior trach/PEG PT OT has recommended CIR.

## 2024-02-06 DIAGNOSIS — R0902 Hypoxemia: Secondary | ICD-10-CM | POA: Diagnosis not present

## 2024-02-06 LAB — GLUCOSE, CAPILLARY
Glucose-Capillary: 87 mg/dL (ref 70–99)
Glucose-Capillary: 88 mg/dL (ref 70–99)
Glucose-Capillary: 97 mg/dL (ref 70–99)
Glucose-Capillary: 81 mg/dL (ref 70–99)

## 2024-02-06 MED ORDER — DEXTROSE-SODIUM CHLORIDE 5-0.45 % IV SOLN: INTRAVENOUS | Status: DC

## 2024-02-06 NOTE — Plan of Care (Signed)
  Problem: Health Behavior/Discharge Planning: Goal: Ability to manage health-related needs will improve Outcome: Progressing   Problem: Clinical Measurements: Goal: Will remain free from infection Outcome: Progressing   Problem: Activity: Goal: Risk for activity intolerance will decrease Outcome: Progressing   Problem: Nutrition: Goal: Adequate nutrition will be maintained Outcome: Progressing   Problem: Elimination: Goal: Will not experience complications related to bowel motility Outcome: Progressing   Problem: Safety: Goal: Ability to remain free from injury will improve Outcome: Progressing   Problem: Safety: Goal: Ability to remain free from injury will improve Outcome: Progressing   Problem: Skin Integrity: Goal: Risk for impaired skin integrity will decrease Outcome: Progressing   Problem: Pain Managment: Goal: General experience of comfort will improve and/or be controlled Outcome: Progressing

## 2024-02-06 NOTE — Plan of Care (Signed)

## 2024-02-06 NOTE — TOC Transition Note (Signed)
 Transition of Care [facility]) - Discharge Note   Patient Details  Name: George Nielsen MRN: [id number] Date of Birth: 12/21/1955  [facility] CM/SW Contact:  Andrez JULIANNA George, RN Phone Number: 02/06/2024, 3:59 PM   Clinical Narrative:     Patient and his SO, Val have decided to have him d/c home with hospice services through [facility]. Amy with [facility] aware.  Pt will transport home via [facility]. Pt already has oxygen set up in the home.  No further needs per CM.   Final next level of care: Home w Hospice Care Barriers to Discharge: No Barriers Identified   Patient Goals and CMS Choice   CMS Medicare.gov Compare Post Acute Care list provided to:: Patient Choice offered to / list presented to : Patient      Discharge Placement                       Discharge Plan and Services Additional resources added to the After Visit Summary for                              [facility] Agency: [facility] Date [facility] Agency Contacted: 02/06/24   Representative spoke with at [facility] Agency: Amy  Social Drivers of Health (SDOH) Interventions SDOH Screenings   Food Insecurity: No Food Insecurity (06/14/2020)   Received from [facility]  Social Connections: Unknown (08/07/2021)   Received from [facility]  Tobacco Use: Medium Risk (02/03/2024)     Readmission Risk Interventions     No data to display

## 2024-02-06 NOTE — Progress Notes (Signed)
 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  Patient was unresponsive after his breakfast and EMS reported that patient had pinpoint pupils and was given  Narcan with some improvement. Of note, family reports he was recently prescribed oxygen PRN for trouble breathing, especially in the morning and with exertion. Labs with notable for respiratory acidosis w/ hypoxia, hypoalbuminemia, BNP 192, otherwise within normal range.  Patient initially required HFNC 10L with SpO2 >94%. Patient did not follow commands, but localized to noxious stimuli and only opened eyes to oral suctioning and grabbed for the [place]. Food particles noted on oral inspection and food particles suctioned out of airway.[name] He was admitted  to ICU for airway watch, place on BiPAP, started antibiotics.  Patient has been n.p.o. since 11/5 and speech therapy recommended strict n.p.o. Patient declined cortrak tube placement and IR and general surgery was consulted for PEG tube placement.  At this time, palliative care discussion underway.       Assessment/Plan: Principal Problem:   Hypoxia Active Problems:   Protein-calorie malnutrition, severe  Acute on chronic hypercarbic respiratory failure Severe COPD Currently on 4 L of oxygen by nasal cannula..  Seen by pulmonary during hospitalization   Acute metabolic encephalopathy- resolved   Hypertension Currently on hydralazine IV as needed.   Dysphagia, odynophagia  Severe protein-calorie malnutrition; hypoalbuminemia Body mass index is 18.95 kg/m.  Nutrition  Status: Nutrition Problem: Severe Malnutrition Etiology: chronic illness (hx cancer) Signs/Symptoms: severe muscle depletion, severe fat depletion Interventions: Tube feeding, Prostat   Seen by speech therapy and patient is n.p.o. since 01/29/2024.  Patient has declined cortrak tube tube.  Patient does have PEG tube in place with he wants to be replaced but IR wasted pulmonary evaluation prior to PEG tube placement.  PEG tube attempted by IR but could not be placed or general surgery was consulted.  Pulmonary and neurosurgery both recommend palliative care at this time.  Patient has been having episodes of hypoglycemia on D5 half-normal saline.     Cervical dystonia s/p ACDF (2022) Stage IV tongue base carcinoma s/p radiation/chemo (2016) prior trach/PEG PT OT has recommended CIR but plan for home with hospice at this time.  Goals of care patient seen by palliative care and at this time plan for home hospice on discharge.  DVT prophylaxis: enoxaparin (LOVENOX) injection 40 mg Start: 02/05/24 1400 SCDs Start: 01/29/24 1407   Disposition: Likely home with hospice when arranged  Status is: Inpatient Remains inpatient appropriate because: disposition plan pending    Code Status:     Code Status: Limited: Do not attempt resuscitation (DNR) -DNR-LIMITED -Do Not Intubate/DNI   Family Communication: Spoke with the patient's significant other at bedside on 02/05/24  Consultants: Palliative care Critical care/pulmonary Interventional radiology General Surgery   Procedures: BiPAP  Anti-infectives:  None  Anti-infectives (From admission, onward)    Start     Dose/Rate Route Frequency Ordered Stop   02/04/24 0600  ceFAZolin  (ANCEF ) IVPB 2g/100 mL premix        2 g 200 mL/hr over 30 Minutes Intravenous To Radiology 02/03/24 1334 02/05/24 0600   01/30/24 1400  cefTRIAXone (ROCEPHIN) 2 g in  sodium chloride  0.9 % 100 mL IVPB        2 g 200 mL/hr over 30 Minutes Intravenous Every 24  hours 01/30/24 1001 02/02/24 1400   01/29/24 1700  piperacillin-tazobactam (ZOSYN) IVPB 2.25 g  Status:  Discontinued        2.25 g 100 mL/hr over 30 Minutes Intravenous Every 8 hours 01/29/24 1633 01/29/24 1636   01/29/24 1700  piperacillin-tazobactam (ZOSYN) IVPB 3.375 g  Status:  Discontinued        3.375 g 12.5 mL/hr over 240 Minutes Intravenous Every 8 hours 01/29/24 1636 01/30/24 1001   01/29/24 1630  cefTRIAXone (ROCEPHIN) 2 g in sodium chloride  0.9 % 100 mL IVPB  Status:  Discontinued        2 g 200 mL/hr over 30 Minutes Intravenous Every 24 hours 01/29/24 1530 01/29/24 1633   01/29/24 1630  azithromycin  (ZITHROMAX ) 500 mg in sodium chloride  0.9 % 250 mL IVPB        500 mg 250 mL/hr over 60 Minutes Intravenous Every 24 hours 01/29/24 1530 01/31/24 1759        Subjective: Today, patient was seen and examined at bedside.  Patient complains of  body pain but denies any increasing shortness of breath cough fevers or chills  Objective: Vitals:   02/06/24 0716 02/06/24 1122  BP:  (!) 141/83  Pulse: 71 70  Resp:  12  Temp: 98.3 F (36.8 C) 98.5 F (36.9 C)  SpO2: 99% 100%    Intake/Output Summary (Last 24 hours) at 02/06/2024 1259 Last data filed at 02/06/2024 0719 Gross per 24 hour  Intake 601.24 ml  Output 550 ml  Net 51.24 ml   Filed Weights   02/01/24 0500 02/02/24 0504 02/03/24 0500  Weight: 54 kg 53.6 kg 58.2 kg   Body mass index is 18.95 kg/m.   Physical Exam: GENERAL: Patient is alert awake and oriented. Not in obvious distress.  Thinly built HENT: No scleral pallor or icterus. Pupils equally reactive to light. Oral mucosa is moist NECK: is supple, no gross swelling noted. CHEST: Decreased breath sounds bilaterally  CVS: S1 and S2 heard, no murmur. Regular rate and rhythm.  ABDOMEN: Soft, non-tender, bowel sounds are present. EXTREMITIES: No edema. CNS: Cranial nerves are intact. No focal motor deficits. SKIN: warm and dry without rashes.  Data  Review: I have personally reviewed the following laboratory data and studies,  CBC: Recent Labs  Lab 01/31/24 0408 02/01/24 0853  WBC 6.9 7.4  HGB 12.2* 13.5  HCT 36.8* 40.4  MCV 93.4 92.7  PLT 187 196   Basic Metabolic Panel: Recent Labs  Lab 01/30/24 1511 01/31/24 0408 02/01/24 0853  NA  --  140 139  K  --  3.8 3.7  CL  --  101 95*  CO2  --  29 30  GLUCOSE  --  103* 88  BUN  --  12 9  CREATININE  --  0.81 0.63  CALCIUM  --  8.7* 9.0  MG 2.4 2.2  --    Liver Function Tests: Recent Labs  Lab 02/01/24 0853  AST 15  ALT 13  ALKPHOS 52  BILITOT 0.4  PROT 6.0*  ALBUMIN 2.8*   No results for input(s): LIPASE, AMYLASE in the last 168 hours.  No results for input(s): AMMONIA in the last 168 hours. Cardiac Enzymes: No results for input(s): CKTOTAL, CKMB, CKMBINDEX, TROPONINI in the last 168 hours. BNP (last 3 results) Recent Labs    01/29/24 1245  BNP 192.1*    ProBNP (last 3 results) No results for input(s): PROBNP in the last 8760 hours.  CBG: Recent Labs  Lab 02/05/24 2037 02/05/24 2344 02/06/24 0434 02/06/24 0718 02/06/24 1121  GLUCAP 76 79 87 88 97   Recent Results (from the past 240 hours)  Culture, blood (Routine x 2)     Status: None   Collection Time: 01/29/24 12:44 PM   Specimen: BLOOD  Result Value Ref Range Status   Specimen Description BLOOD BLOOD RIGHT ARM  Final   Special Requests   Final    BOTTLES DRAWN AEROBIC AND ANAEROBIC Blood Culture adequate volume   Culture   Final    NO GROWTH 5 DAYS Performed at [facility], [address], [place], [place] [address]    Report Status 02/03/2024 FINAL  Final  MRSA Next Gen by PCR, Nasal     Status: None   Collection Time: 01/29/24  2:06 PM   Specimen: Nasal Mucosa; Nasal Swab  Result Value Ref Range Status   MRSA by PCR Next Gen NOT DETECTED NOT DETECTED Final    Comment: (NOTE) The GeneXpert MRSA Assay (FDA approved for NASAL specimens only), is one component  of a comprehensive MRSA colonization surveillance program. It is not intended to diagnose MRSA infection nor to guide or monitor treatment for MRSA infections. Test performance is not FDA approved in patients less than 88 years old. Performed at [facility], [address], [place], [place] [address]   Culture, blood (Routine x 2)     Status: None   Collection Time: 01/29/24  4:09 PM   Specimen: BLOOD RIGHT ARM  Result Value Ref Range Status   Specimen Description BLOOD RIGHT ARM  Final   Special Requests   Final    BOTTLES DRAWN AEROBIC AND ANAEROBIC Blood Culture adequate volume   Culture   Final    NO GROWTH 5 DAYS Performed at [facility], [address], [place], [place] [address]    Report Status 02/03/2024 FINAL  Final  Resp panel by RT-PCR (RSV, Flu A&B, Covid) Anterior Nasal Swab     Status: None   Collection Time: 02/01/24  6:44 PM   Specimen: Anterior Nasal Swab  Result Value Ref Range Status   SARS Coronavirus 2 by RT PCR NEGATIVE NEGATIVE Final   Influenza A by PCR NEGATIVE NEGATIVE Final   Influenza B by PCR NEGATIVE NEGATIVE Final    Comment: (NOTE) The Xpert Xpress SARS-CoV-2/FLU/RSV plus assay is intended as an aid in the diagnosis of influenza from Nasopharyngeal swab specimens and should not be used as a sole basis for treatment. Nasal washings and aspirates are unacceptable for Xpert Xpress SARS-CoV-2/FLU/RSV testing.  Fact Sheet for Patients: bloggercourse.com  Fact Sheet for Healthcare Providers: seriousbroker.it  This test is not yet approved or cleared by the [place]  FDA and has been authorized for detection and/or diagnosis of SARS-CoV-2 by FDA under an Emergency Use Authorization (EUA). This EUA will remain in effect (meaning this test can be used) for the duration of the COVID-19 declaration under Section 564(b)(1) of the Act, 21 U.S.C. section 360bbb-3(b)(1), unless the  authorization is terminated or revoked.     Resp Syncytial Virus by PCR NEGATIVE NEGATIVE Final    Comment: (NOTE) Fact Sheet for Patients: bloggercourse.com  Fact Sheet for Healthcare Providers: seriousbroker.it  This test is not yet approved or cleared by the [place]  FDA and has been authorized for detection  and/or diagnosis of SARS-CoV-2 by FDA under an Emergency Use Authorization (EUA). This EUA will remain in effect (meaning this test can be used) for the duration of the COVID-19 declaration under Section 564(b)(1) of the Act, 21 U.S.C. section 360bbb-3(b)(1), unless the authorization is terminated or revoked.  Performed at [facility], [address], [place], [place] [address]      Studies: No results found.     Emree Locicero, MD  [facility] 02/06/2024  If 7PM-7AM, please contact night-coverage

## 2024-02-06 NOTE — Discharge Summary (Signed)
 Physician Discharge Summary  George Nielsen FMW:[id number] DOB: Aug 11, 1955 DOA: 01/29/2024  [facility]  Admit date: 01/29/2024 Discharge date: 02/06/2024  Admitted From: Home  Discharge disposition: Home with hospice   Recommendations for Outpatient Follow-Up:   Follow up with hospice care provider after discharge.   Discharge Diagnosis:   Principal Problem:   Hypoxia Active Problems:   Protein-calorie malnutrition, severe   Discharge Condition: Improved.  Diet recommendation: Puree for comfort  Wound care: None.  Code status: Full.   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  Patient was unresponsive after his breakfast and EMS reported that patient had pinpoint pupils and was given  Narcan with some improvement. Of note, family reports he was recently prescribed oxygen PRN for trouble breathing, especially in the morning and with exertion. Labs with notable for respiratory acidosis w/ hypoxia, hypoalbuminemia, BNP 192, otherwise within normal range.  Patient initially required HFNC 10L with SpO2 >94%. Patient did not follow commands, but localized to noxious stimuli and only opened eyes to oral suctioning and grabbed for the [place]. Food particles noted on oral inspection and food particles suctioned out of airway.[name] He was admitted  to ICU for airway watch, place on BiPAP, started antibiotics.  Patient has been n.p.o. since 11/5 and speech therapy recommended strict n.p.o. Patient declined cortrak tube placement and IR and general surgery was consulted for PEG tube placement.    Hospital Course:   Following conditions were addressed during hospitalization  as listed below,  Acute on chronic hypercarbic respiratory failure Severe COPD Currently on 4 L of oxygen by nasal cannula..  Seen by pulmonary during hospitalization.  Patient will continue oxygen nebulizers and inhalers on discharge.   Acute metabolic encephalopathy- resolved   Hypertension Received on hydralazine IV as needed.  On amlodipine  losartan and lisinopril  at home.  Will resume amlodipine  on discharge.   Dysphagia, odynophagia  Severe protein-calorie malnutrition; hypoalbuminemia Body mass index is 18.95 kg/m.  Nutrition Status: Nutrition Problem: Severe Malnutrition Etiology: chronic illness (hx cancer) Signs/Symptoms: severe muscle depletion, severe fat depletion Interventions: Tube feeding, Prostat   Seen by speech therapy and patient is n.p.o. since 01/29/2024.  Patient has declined cortrak tube tube.  Patient does have PEG tube in place with he wants to be replaced but IR wasted pulmonary evaluation prior to PEG tube placement.  PEG tube attempted by IR but could not be placed or general surgery was consulted.  Pulmonary and neurosurgery both recommend palliative care and at this time plan is hospice at home..  Patient had been having episodes of hypoglycemia during hospitalization and received D5 half-normal saline.  Patient has high risk of aspiration and depending upon goals of care could take orally.  Would recommend pure for comfort   Cervical dystonia s/p ACDF (2022) Stage IV tongue base carcinoma s/p radiation/chemo (2016) prior trach/PEG PT OT has recommended CIR but plan for home with hospice at this time.   Goals of care patient seen by palliative care and at this time plan for home hospice on discharge.  Disposition.  At this time, patient is stable for disposition home with home hospice.  Medical Consultants:   Critical care  palliative care Interventional radiology General surgery  Procedures:  BiPAP Subjective:   Today, patient was seen and  examined at bedside.  Patient complains of  body pain but denies any increasing shortness of breath cough fevers or chills   Discharge Exam:   Vitals:   02/06/24 0716 02/06/24 1122  BP:  (!) 141/83  Pulse: 71 70  Resp:  12  Temp: 98.3 F (36.8 C) 98.5 F (36.9 C)  SpO2: 99% 100%   Vitals:   02/06/24 0003 02/06/24 0413 02/06/24 0716 02/06/24 1122  BP: 124/69 (!) 150/79  (!) 141/83  Pulse:  74 71 70  Resp: 18 18  12   Temp: 98.6 F (37 C) 97.8 F (36.6 C) 98.3 F (36.8 C) 98.5 F (36.9 C)  TempSrc: Oral Oral Oral Oral  SpO2: 98% 100% 99% 100%  Weight:      Height:       GENERAL: Patient is alert awake and oriented. Not in obvious distress.  Thinly built HENT: No scleral pallor or icterus. Pupils equally reactive to light. Oral mucosa is moist NECK: is supple, no gross swelling noted. CHEST: Decreased breath sounds bilaterally  CVS: S1 and S2 heard, no murmur. Regular rate and rhythm.  ABDOMEN: Soft, non-tender, bowel sounds are present. EXTREMITIES: No edema. CNS: Cranial nerves are intact. No focal motor deficits. SKIN: warm and dry without rashes.  The results of significant diagnostics from this hospitalization (including imaging, microbiology, ancillary and laboratory) are listed below for reference.     Diagnostic Studies:   DG Swallowing Func-Speech Pathology Result Date: 01/30/2024 Table formatting from the original result was not included. Modified Barium Swallow Study Patient Details Name: George Nielsen MRN: [id number] Date of Birth: [birth year] Today's Date: 01/30/2024 HPI/PMH: HPI: Patient is a 68 y.o. 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 On exam, patient on HFNC  10L with SpO2 >94%, did not follow commands. Food particles noted on oral inspection. He was admitted to ICU and placed on BiPAP. CTH showed Subacute to chronic appearing small vessel ischemic changes within the right frontal parietal periventricular and subcortical white matter but no other signs of acute infarct or hemorrhage. CXR showed minimal bibasilar atelectasis. Patient was repeatedly taking off BiPAP resulting in oxygen saturation dropping to 80's%. RT placed patient on 8L [place]. Patient has been NPO and SLP swallow evaluation ordered. Clinical Impression: Clinical Impression: Patient presents with a severe-profound oropharyngeal dysphagia as per this MBS. Because of his cervical anatomy, his head was in a chin tuck and head turned to left position. Visualization of pharyngeal structures was challenging and clear visualization of epiglottis was not achieved. Patient exhibited very minimal anterior movement of hyoid, incomplete laryngeal vestibule closure, no epiglottic inversion and minimal laryngeal elevation. Penetration to the vocal cords that did not clear (PAS 5) as well as silent aspiration (PAS 8) occured with all tested barium consistencies (thin, nectar thick, honey thick, puree). Wet sounding voice and instances of patient's throat clearing did correlate with barium residuals at approximate level of the vocal cords. Patient unable to effectively clear residuals in laryngeal vestibule or in pharynx. Retrograde movement of barium observed coming up through the PES and at least one time resulting in penetration and then aspiration. Unfortunately, SLP cannot recommend any safe PO consistency and there are no  strategies that patient would be able to perform to reduce aspiration risk. SLP recommends strict NPO status. DIGEST Swallow Severity Rating*  Safety: 4  Efficiency:3  Overall Pharyngeal Swallow Severity: 4 1: mild; 2: moderate; 3: severe; 4: profound *The Dynamic Imaging Grade of Swallowing Toxicity  is standardized for the head and neck cancer population, however, demonstrates promising clinical applications across populations to standardize the clinical rating of pharyngeal swallow safety and severity. Factors that may increase risk of adverse event in presence of aspiration Noe & Lianne 2021): Factors that may increase risk of adverse event in presence of aspiration Noe & Lianne 2021): Poor general health and/or compromised immunity; Limited mobility; Frail or deconditioned; Weak cough; Frequent aspiration of large volumes Recommendations/Plan: Swallowing Evaluation Recommendations Swallowing Evaluation Recommendations Recommendations: NPO Medication Administration: Via alternative means Oral care recommendations: Oral care QID (4x/day) Recommended consults: Consider Palliative care Treatment Plan Treatment Plan Treatment recommendations: No treatment recommended at this time Follow-up recommendations: Follow physicians's recommendations for discharge plan and follow up therapies Functional status assessment: Patient has had a recent decline in their functional status and/or demonstrates limited ability to make significant improvements in function in a reasonable and predictable amount of time. Recommendations Recommendations for follow up therapy are one component of a multi-disciplinary discharge planning process, led by the attending physician.  Recommendations may be updated based on patient status, additional functional criteria and insurance authorization. Assessment: Orofacial Exam: Orofacial Exam Oral Cavity - Dentition: Adequate natural dentition Orofacial Anatomy: WFL Anatomy: Anatomy: Other (Comment) (see impression statement) Boluses Administered: Boluses Administered Boluses Administered: Thin liquids (Level 0); Mildly thick liquids (Level 2, nectar thick); Moderately thick liquids (Level 3, honey thick); Puree  Oral Impairment Domain: Oral Impairment Domain Lip Closure: No labial  escape Tongue control during bolus hold: Not tested Bolus transport/lingual motion: Slow tongue motion Oral residue: Residue collection on oral structures Location of oral residue : Tongue Initiation of pharyngeal swallow : Pyriform sinuses  Pharyngeal Impairment Domain: Pharyngeal Impairment Domain Soft palate elevation: Escape to nasopharynx Laryngeal elevation: Minimal superior movement of thyroid  cartilage with minimal approximation of arytenoids to epiglottic petiole Anterior hyoid excursion: Partial anterior movement Epiglottic movement: No inversion Laryngeal vestibule closure: Incomplete, narrow column air/contrast in laryngeal vestibule Pharyngeal stripping wave : Absent Pharyngeal contraction (A/P view only): N/A Pharyngoesophageal segment opening: Partial distention/partial duration, partial obstruction of flow Tongue base retraction: Narrow column of contrast or air between tongue base and PPW Pharyngeal residue: Collection of residue within or on pharyngeal structures Location of pharyngeal residue: Pyriform sinuses; Aryepiglottic folds; Pharyngeal wall; Tongue base; Diffuse (>3 areas)  Esophageal Impairment Domain: Esophageal Impairment Domain Esophageal clearance upright position: Esophageal retention with retrograde flow through the PES Pill: No data recorded Penetration/Aspiration Scale Score: Penetration/Aspiration Scale Score 5.  Material enters airway, CONTACTS cords and not ejected out: Mildly thick liquids (Level 2, nectar thick); Thin liquids (Level 0); Moderately thick liquids (Level 3, honey thick); Puree 8.  Material enters airway, passes BELOW cords without attempt by patient to eject out (silent aspiration) : Thin liquids (Level 0); Mildly thick liquids (Level 2, nectar thick); Moderately thick liquids (Level 3, honey thick); Puree Compensatory Strategies: Compensatory Strategies Compensatory strategies: No   General Information: No data recorded Diet Prior to this Study: NPO    Temperature : Normal   No data recorded  Supplemental O2: High flow nasal cannula   History of Recent Intubation: No  Behavior/Cognition: Alert; Cooperative; Pleasant mood Self-Feeding Abilities: Able to self-feed Baseline vocal quality/speech: Dysphonic Volitional  Cough: Able to elicit Volitional Swallow: Able to elicit Exam Limitations: Poor positioning; Limited visibility Goal Planning: Prognosis for improved oropharyngeal function: Guarded No data recorded No data recorded Patient/Family Stated Goal: patient agreeable to MBS but wants to hold off as his throat is sore and he thinks it would be a waste of time Consulted and agree with results and recommendations: Pt unable/family or caregiver not available Pain: Pain Assessment Pain Assessment: No/denies pain Pain Score: 0 Faces Pain Scale: 2 Facial Expression: 0 Body Movements: 0 Muscle Tension: 0 Compliance with ventilator (intubated pts.): N/A Vocalization (extubated pts.): 0 CPOT Total: 0 Pain Location: throat Pain Descriptors / Indicators: Discomfort Pain Intervention(s): Monitored during session End of Session: Start Time:SLP Start Time (ACUTE ONLY): 1434 Stop Time: SLP Stop Time (ACUTE ONLY): 1445 Time Calculation:SLP Time Calculation (min) (ACUTE ONLY): 11 min Charges: SLP Evaluations $ SLP Speech Visit: 1 Visit SLP Evaluations $BSS Swallow: 1 Procedure $MBS Swallow: 1 Procedure SLP visit diagnosis: SLP Visit Diagnosis: Dysphagia, oropharyngeal phase (R13.12) Past Medical History: Past Medical History: Diagnosis Date  Allergy   Anemia   Anxiety   Arthritis   Cancer (HCC)   tongue and neck lymph nodes  COPD (chronic obstructive pulmonary disease) (HCC)   Dyspnea   climbing stairs, walking distance  Emphysema of lung (HCC)   GERD (gastroesophageal reflux disease)   Hepatitis C   treated  History of radiation therapy 11/10/14- 12/29/14  BOT and bilateral neck/ 70 Gy in 35 fractions to gross disease, 63 Gy in 35 fractions to high risk nodal echelons, and 56 Gy  in 35 fraction to intermediate risk nodal echelons.   Hypertension   Hypothyroidism   PNA (pneumonia)   4 times  Sleep apnea   wears CPAP sometimes per pt.   05/25/19 - not able to use at this time due to neck pain.  Thyroid  disease   take Synthroid  d/t scar tissue from radiation around thyroid   Ulcer  Past Surgical History: Past Surgical History: Procedure Laterality Date  ANTERIOR CERVICAL DECOMP/DISCECTOMY FUSION N/A 05/27/2020  Procedure: Cervical Three-Four Anterior cervical decompression/discectomy/fusion;  Surgeon: Gillie Duncans, MD;  Location[facility];  Service: Neurosurgery;  Laterality: N/A;  anterior  BACK SURGERY    COLONOSCOPY WITH PROPOFOL  N/A 01/06/2020  Procedure: COLONOSCOPY WITH PROPOFOL ;  Surgeon: San Sandor GAILS, DO;  Location: [facility];  Service: Gastroenterology;  Laterality: N/A;  ELBOW SURGERY Left   HEMOSTASIS CLIP PLACEMENT  01/06/2020  Procedure: HEMOSTASIS CLIP PLACEMENT;  Surgeon: San Sandor GAILS, DO;  Location: [facility];  Service: Gastroenterology;;  POLYPECTOMY  01/06/2020  Procedure: POLYPECTOMY;  Surgeon: San Sandor GAILS, DO;  Location: [facility];  Service: Gastroenterology;;  SHOULDER SURGERY    TONSILLECTOMY    tracheotomy    WRIST SURGERY    right Norleen IVAR Blase, MA, CCC-SLP Speech Therapy   DG CHEST PORT 1 VIEW Result Date: 01/30/2024 EXAM: 1 VIEW(S) XRAY OF THE CHEST 01/30/2024 08:41:14 AM COMPARISON: 01/29/2024 CLINICAL HISTORY: Aspiration into airway FINDINGS: LUNGS AND PLEURA: Bilateral pleural effusion, small. Scattered bibasilar platelike opacities. Emphysematous changes. No pulmonary edema. No pneumothorax. HEART AND MEDIASTINUM: Atherosclerotic calcifications. BONES AND SOFT TISSUES: No acute osseous abnormality. IMPRESSION: 1. Bilateral pleural effusions, small. 2. Scattered bibasilar platelike opacities, likely atelectasis or scarring. Electronically signed by: Waddell Calk MD 01/30/2024 02:17 PM EST RP Workstation: [id number]   CT Head Wo  Contrast Result Date: 01/29/2024 CLINICAL DATA:  Altered level of consciousness EXAM: CT HEAD WITHOUT CONTRAST TECHNIQUE: Contiguous axial images were obtained from  the base of the skull through the vertex without intravenous contrast. RADIATION DOSE REDUCTION: This exam was performed according to the departmental dose-optimization program which includes automated exposure control, adjustment of the mA and/or kV according to patient size and/or use of iterative reconstruction technique. COMPARISON:  11/09/2023 FINDINGS: Brain: Since the previous exam, hypodensities have developed within the right frontal parietal periventricular and subcortical white matter, consistent with prior infarct. No evidence of acute infarct or hemorrhage. The lateral ventricles and midline structures are unremarkable. No acute extra-axial fluid collections. No mass effect. Vascular: No hyperdense vessel or unexpected calcification. Skull: Normal. Negative for fracture or focal lesion. Sinuses/Orbits: No acute finding. Other: None. IMPRESSION: 1. Subacute to chronic appearing small vessel ischemic changes within the right frontal parietal periventricular and subcortical white matter, new since prior exam. 2. No other signs of acute infarct or hemorrhage. Electronically Signed   By: Ozell Daring M.D.   On: 01/29/2024 14:59   DG Chest Portable 1 View Result Date: 01/29/2024 EXAM: 1 VIEW XRAY OF THE CHEST 01/29/2024 01:48:00 PM COMPARISON: Previous exams are available. CLINICAL HISTORY: ams, hypotension, hypoxia, cough FINDINGS: LUNGS AND PLEURA: Minimal bibasilar atelectasis is noted. No focal pulmonary opacity. No pulmonary edema. No pleural effusion. No pneumothorax. HEART AND MEDIASTINUM: No acute abnormality of the cardiac and mediastinal silhouettes. BONES AND SOFT TISSUES: No acute osseous abnormality. IMPRESSION: 1. Minimal bibasilar atelectasis. Electronically signed by: Lynwood Seip MD 01/29/2024 02:09 PM EST RP Workstation:  [id number]     Labs:   Basic Metabolic Panel: Recent Labs  Lab 01/31/24 0408 02/01/24 0853  NA 140 139  K 3.8 3.7  CL 101 95*  CO2 29 30  GLUCOSE 103* 88  BUN 12 9  CREATININE 0.81 0.63  CALCIUM 8.7* 9.0  MG 2.2  --    GFR Estimated Creatinine Clearance: 70.7 mL/min (by C-G formula based on SCr of 0.63 mg/dL). Liver Function Tests: Recent Labs  Lab 02/01/24 0853  AST 15  ALT 13  ALKPHOS 52  BILITOT 0.4  PROT 6.0*  ALBUMIN 2.8*   No results for input(s): LIPASE, AMYLASE in the last 168 hours. No results for input(s): AMMONIA in the last 168 hours. Coagulation profile No results for input(s): INR, PROTIME in the last 168 hours.  CBC: Recent Labs  Lab 01/31/24 0408 02/01/24 0853  WBC 6.9 7.4  HGB 12.2* 13.5  HCT 36.8* 40.4  MCV 93.4 92.7  PLT 187 196   Cardiac Enzymes: No results for input(s): CKTOTAL, CKMB, CKMBINDEX, TROPONINI in the last 168 hours. BNP: Invalid input(s): POCBNP CBG: Recent Labs  Lab 02/05/24 2037 02/05/24 2344 02/06/24 0434 02/06/24 0718 02/06/24 1121  GLUCAP 76 79 87 88 97   D-Dimer No results for input(s): DDIMER in the last 72 hours. Hgb A1c No results for input(s): HGBA1C in the last 72 hours. Lipid Profile No results for input(s): CHOL, HDL, LDLCALC, TRIG, CHOLHDL, LDLDIRECT in the last 72 hours. Thyroid  function studies No results for input(s): TSH, T4TOTAL, T3FREE, THYROIDAB in the last 72 hours.  Invalid input(s): FREET3 Anemia work up No results for input(s): VITAMINB12, FOLATE, FERRITIN, TIBC, IRON, RETICCTPCT in the last 72 hours. Microbiology Recent Results (from the past 240 hours)  Culture, blood (Routine x 2)     Status: None   Collection Time: 01/29/24 12:44 PM   Specimen: BLOOD  Result Value Ref Range Status   Specimen Description BLOOD BLOOD RIGHT ARM  Final   Special Requests   Final    BOTTLES  DRAWN AEROBIC AND ANAEROBIC Blood Culture  adequate volume   Culture   Final    NO GROWTH 5 DAYS Performed at [facility], [address], [place], [place] [address]    Report Status 02/03/2024 FINAL  Final  MRSA Next Gen by PCR, Nasal     Status: None   Collection Time: 01/29/24  2:06 PM   Specimen: Nasal Mucosa; Nasal Swab  Result Value Ref Range Status   MRSA by PCR Next Gen NOT DETECTED NOT DETECTED Final    Comment: (NOTE) The GeneXpert MRSA Assay (FDA approved for NASAL specimens only), is one component of a comprehensive MRSA colonization surveillance program. It is not intended to diagnose MRSA infection nor to guide or monitor treatment for MRSA infections. Test performance is not FDA approved in patients less than 68 years old. Performed at [facility], [address], [place], [place] [address]   Culture, blood (Routine x 2)     Status: None   Collection Time: 01/29/24  4:09 PM   Specimen: BLOOD RIGHT ARM  Result Value Ref Range Status   Specimen Description BLOOD RIGHT ARM  Final   Special Requests   Final    BOTTLES DRAWN AEROBIC AND ANAEROBIC Blood Culture adequate volume   Culture   Final    NO GROWTH 5 DAYS Performed at [facility], [address], [place], [place] [address]    Report Status 02/03/2024 FINAL  Final  Resp panel by RT-PCR (RSV, Flu A&B, Covid) Anterior Nasal Swab     Status: None   Collection Time: 02/01/24  6:44 PM   Specimen: Anterior Nasal Swab  Result Value Ref Range Status   SARS Coronavirus 2 by RT PCR NEGATIVE NEGATIVE Final   Influenza A by PCR NEGATIVE NEGATIVE Final   Influenza B by PCR NEGATIVE NEGATIVE Final    Comment: (NOTE) The Xpert Xpress SARS-CoV-2/FLU/RSV plus assay is intended as an aid in the diagnosis of influenza from Nasopharyngeal swab specimens and should not be used as a sole basis for treatment. Nasal washings and aspirates are unacceptable for Xpert Xpress SARS-CoV-2/FLU/RSV testing.  Fact Sheet for  Patients: bloggercourse.com  Fact Sheet for Healthcare Providers: seriousbroker.it  This test is not yet approved or cleared by the [place]  FDA and has been authorized for detection and/or diagnosis of SARS-CoV-2 by FDA under an Emergency Use Authorization (EUA). This EUA will remain in effect (meaning this test can be used) for the duration of the COVID-19 declaration under Section 564(b)(1) of the Act, 21 U.S.C. section 360bbb-3(b)(1), unless the authorization is terminated or revoked.     Resp Syncytial Virus by PCR NEGATIVE NEGATIVE Final    Comment: (NOTE) Fact Sheet for Patients: bloggercourse.com  Fact Sheet for Healthcare Providers: seriousbroker.it  This test is not yet approved or cleared by the [place]  FDA and has been authorized for detection and/or diagnosis of SARS-CoV-2 by FDA under an Emergency Use Authorization (EUA). This EUA will remain in effect (meaning this test can be used) for the duration of the COVID-19 declaration under Section 564(b)(1) of the Act, 21 U.S.C. section 360bbb-3(b)(1), unless the authorization is terminated or revoked.  Performed at [facility], [address] [place], [place] [address]      Discharge Instructions:   Discharge Instructions     Discharge instructions   Complete by: As directed    Follow up with hospice care provider   Increase activity slowly  Complete by: As directed       Allergies as of 02/06/2024       Reactions   Tiotropium Anaphylaxis, Anxiety   Benzyl Alcohol-camphor-menthol  Dermatitis   Budesonide-formoterol Fumarate Other (See Comments)   Unknown    Pilocarpine Other (See Comments)   Sweating, Nasal discharge   Poison Oak Extract Dermatitis   Vancomycin Other (See Comments)   Unknown    Bee Venom Swelling   Hydrocodone  Other (See Comments)   Unknown    Vicodin  [hydrocodone -acetaminophen ] Nausea And Vomiting        Medication List     STOP taking these medications    chlorhexidine  0.12 % solution Commonly known as: PERIDEX    ciprofloxacin-dexamethasone  OTIC suspension Commonly known as: CIPRODEX   cyclobenzaprine  10 MG tablet Commonly known as: FLEXERIL    Ensure Clear Liqd   Ensure Original Liqd   lisinopril  20 MG tablet Commonly known as: ZESTRIL    loratadine  10 MG tablet Commonly known as: CLARITIN    losartan 50 MG tablet Commonly known as: COZAAR   Magnesium  Oxide 420 MG Tabs   meloxicam 7.5 MG tablet Commonly known as: MOBIC   SODIUM CHLORIDE  PO   sodium fluoride  1.1 % Gel dental gel Commonly known as: FLUORISHIELD   Sodium Fluoride  1.1 % Pste   tiZANidine 4 MG tablet Commonly known as: ZANAFLEX       TAKE these medications    acetaminophen  500 MG tablet Commonly known as: TYLENOL  Take 1,000 mg by mouth every 8 (eight) hours as needed for moderate pain.   albuterol  (2.5 MG/3ML) 0.083% nebulizer solution Commonly known as: PROVENTIL  Take 2.5 mg by nebulization every 6 (six) hours as needed for wheezing or shortness of breath.   albuterol  108 (90 Base) MCG/ACT inhaler Commonly known as: VENTOLIN  HFA Inhale 2 puffs into the lungs every 6 (six) hours as needed for wheezing or shortness of breath.   amLODipine  10 MG tablet Commonly known as: NORVASC  Take 10 mg by mouth daily.   buPROPion  150 MG 12 hr tablet Commonly known as: WELLBUTRIN  SR Take 150 mg by mouth 2 (two) times daily.   fluticasone-salmeterol 250-50 MCG/ACT Aepb Commonly known as: ADVAIR Inhale 1 puff into the lungs in the morning and at bedtime.   gabapentin  300 MG capsule Commonly known as: NEURONTIN  Take 300-600 mg by mouth See admin instructions. Take 1 capsule (300 mg) by mouth in the morning, take 1 capsule (300 mg) by mouth at noon & take 3 capsules (900 mg) by mouth at night.   hydrOXYzine  10 MG tablet Commonly known as:  ATARAX  Take 10 mg by mouth 3 (three) times daily as needed for anxiety.   ipratropium 0.02 % nebulizer solution Commonly known as: ATROVENT  Take 0.5 mg by nebulization 4 (four) times daily as needed for wheezing or shortness of breath.   ipratropium 0.06 % nasal spray Commonly known as: ATROVENT  Place 1 spray into both nostrils 4 (four) times daily.   levothyroxine  112 MCG tablet Commonly known as: SYNTHROID  Take 112 mcg by mouth daily before breakfast.   pantoprazole  40 MG tablet Commonly known as: PROTONIX  Take 40 mg by mouth in the morning and at bedtime.   sodium chloride  0.65 % Soln nasal spray Commonly known as: OCEAN Place 1 spray into both nostrils in the morning, at noon, and at bedtime.   tamsulosin  0.4 MG Caps capsule Commonly known as: FLOMAX  Take 0.8 mg by mouth at bedtime.  Durable Medical Equipment  (From admission, onward)           Start     Ordered   02/03/24 1401  For home use only DME Tube feeding  Once       Comments:  Osmolite 1.5 (or equivalent), 1 carton 6x/d  Administer 1/2 carton of formula on day 1 and increase to 1 full carton on day 2 to monitor for signs of refeeding and also allow time for tolerance  Flush with 60mL of free water before and after each bolus ( , 6x/d)  This provides 2130kcal, 89g protein, and ( = TF+flush)   02/03/24 1400            Contact information for after-discharge care     Home Medical Care     [facility]) .   Service: Home Health Services Contact information: [address] [place]  [address] [phone]                      Time coordinating discharge: 39 minutes  Signed:  Deatra Mcmahen  [facility] 02/06/2024, 3:32 PM

## 2024-02-06 NOTE — Progress Notes (Signed)
 Pt left hospital, alert and oriented x4, VS stable, no signs of acute distress. Pt left via [facility]AR, O2 4L/min. Pt belongings sent with pt.

## 2024-02-06 NOTE — Progress Notes (Signed)
 PCCM Interval Note  Patient has decided to transition to hospice. PCCM will sign off.

## 2024-02-06 NOTE — Progress Notes (Signed)
 Communicated with patient via Caregility. Patient appears comfortable in bed. AVS paperwork given to the patient. Discharge instructions discussed. Patient's question answered to satisfaction. Patient agreeable with discharge plan.

## 2024-02-06 NOTE — Progress Notes (Incomplete)
 Daily Progress Note   Date: 02/06/2024   Patient Name: George Nielsen  DOB: May 04, 1955  MRN: [id number]  Age / Sex: 68 y.o., male  Attending Physician: Sonjia Held, MD Primary Care Physician: [facility], [facility] Admit Date: 01/29/2024 Length of Stay: 8 days  Reason for Follow-up: Establishing goals of care  Past Medical History:  Diagnosis Date   Allergy    Anemia    Anxiety    Arthritis    Cancer (HCC)    tongue and neck lymph nodes   COPD (chronic obstructive pulmonary disease) (HCC)    Dyspnea    climbing stairs, walking distance   Emphysema of lung (HCC)    GERD (gastroesophageal reflux disease)    Hepatitis C    treated   History of radiation therapy 11/10/14- 12/29/14   BOT and bilateral neck/ 70 Gy in 35 fractions to gross disease, 63 Gy in 35 fractions to high risk nodal echelons, and 56 Gy in 35 fraction to intermediate risk nodal echelons.    Hypertension    Hypothyroidism    PNA (pneumonia)    4 times   Sleep apnea    wears CPAP sometimes per pt.   05/25/19 - not able to use at this time due to neck pain.   Thyroid  disease    take Synthroid  d/t scar tissue from radiation around thyroid    Ulcer     Subjective:   Subjective: Chart Reviewed. Updates received. Patient Assessed. Created space and opportunity for patient  and family to explore thoughts and feelings regarding current medical situation.  Today's Discussion: Today before meeting with the patient/family, I reviewed the chart notes including dietitian note from yesterday, internal medicine note from yesterday, OT note from yesterday, hospice liaison note from yesterday, TOC note from yesterday, nursing note from today, pulmonary note from today. I also reviewed vital signs, nursing flowsheets, medication administrations record, labs, and imaging. Labs reviewed include serial glucose readings which are generally stable between 76 and 87 with n.p.o. status.  Today saw the patient at bedside, no  family is present.  The patient is sitting up in the bed, awake and alert.  Today he feels okay in general, denies pain, nausea, vomiting.  He is still n.p.o. and frustrated.  He keeps telling me he wants to go home.  We talked about his visit with hospice liaison yesterday and he has not made a decision, wants to talk with his fiance Berwyn who is coming today before making any final decision.  I explained that if he is wants to continue aggressive care, including possible feeding tube, that he is not stable for discharge.  However, if they do decide to opt for hospice we would likely be able to get him out of the hospital possibly today or tomorrow, depending on the time that day make their decision.  He verbalized understanding.  I also received communication from Val with the patient's HCPOA documentation which I uploaded into Vynca/ACP tab.  Later in the day ***.  I provided emotional and general support through therapeutic listening, empathy, sharing of stories, and other techniques. I answered all questions and addressed all concerns to the best of my ability.  Review of Systems  Constitutional:        Denies pain in general  Respiratory:  Negative for shortness of breath.   Cardiovascular:  Negative for chest pain.  Gastrointestinal:  Negative for abdominal pain, nausea and vomiting.    Objective:   Primary Diagnoses: Present on Admission:  Hypoxia   Vital Signs:  BP (!) 150/79 (BP Location: Left Arm)   Pulse 71   Temp 98.3 F (36.8 C) (Oral)   Resp 18   Ht 5' 9 (1.753 m)   Wt 58.2 kg   SpO2 99%   BMI 18.95 kg/m   Physical Exam Vitals and nursing note reviewed.  Constitutional:      General: He is not in acute distress.    Appearance: He is ill-appearing.  HENT:     Head: Normocephalic and atraumatic.  Cardiovascular:     Rate and Rhythm: Normal rate.  Pulmonary:     Effort: Pulmonary effort is normal. No respiratory distress.  Abdominal:     General:  Abdomen is flat.  Skin:    General: Skin is warm and dry.  Neurological:     General: No focal deficit present.     Mental Status: He is alert and oriented to person, place, and time.  Psychiatric:        Mood and Affect: Mood normal.        Behavior: Behavior normal.     Palliative Assessment/Data: 10% (NPO)   Existing Vynca/ACP Documentation: Advance directive signed 10/13/2014  Assessment & Plan:   HPI/Patient Profile:  68 y.o. 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  He was admitted on 01/29/2024 with acute on chronic hypercarbic respiratory failure, severe COPD, acute metabolic encephalopathy, hypertension, dysphagia, odynophagia, severe protein calorie malnutrition, hypoalbuminemia, and others.    Palliative medicine was consulted for GOC conversations  SUMMARY OF RECOMMENDATIONS   DNR-Limited Awaiting patient decision on home with hospice Continue current scope of care in the meantime Ongoing supportive patient and family Anticipate decision on hospice in the next 24 hours Palliative medicine will continue to follow***  Symptom Management:  Per primary team Palliative medicine is available to assist as needed  Code Status: DNR - Limited (DNR/DNI)  Prognosis: < 6 months  Discharge Planning: Home with Hospice  Discussed with: Patient, family, medical team, nursing team, [facility] team, hospice liaison  Thank you for allowing us  to participate in the care of Waris S Sun PMT will continue to support holistically.  Billing based on MDM: ***  {Problems Addressed:304933}  {Amount and/or Complexity of Data:304934}  {Risks:304936}  Detailed review of medical records (labs, imaging, vital signs), medically  appropriate exam, discussed with treatment team, counseling and education to patient, family, & staff, documenting clinical information, medication management, coordination of care  Camellia Kays, NP Palliative Medicine Team  Team Phone # [phone] (Nights/Weekends)  11/22/2020, 8:17 AM

## 2024-02-06 NOTE — Progress Notes (Signed)
 PT Cancellation Note  Patient Details Name: George Nielsen MRN: [id number] DOB: 04-26-1955   Cancelled Treatment:    Reason Eval/Treat Not Completed: (P) Patient declined, no reason specified (Pt declines mobility today and states I just want to be left alone. When asked, pt requested that therapy continue to check on him. Will continue to follow.)   [name] 02/06/2024, 3:32 PM

## 2024-02-21 ENCOUNTER — Other Ambulatory Visit: Payer: Self-pay

## 2024-02-24 DEATH — deceased

## 2024-05-01 ENCOUNTER — Ambulatory Visit: Admitting: Neurology
